# Patient Record
Sex: Female | Born: 1950
Health system: Southern US, Community
[De-identification: ages and names within clinical notes are randomized; demographics above are authoritative.]

## PROBLEM LIST (undated history)

## (undated) DIAGNOSIS — K219 Gastro-esophageal reflux disease without esophagitis: Secondary | ICD-10-CM

## (undated) DIAGNOSIS — R3129 Other microscopic hematuria: Secondary | ICD-10-CM

## (undated) HISTORY — PX: LAPAROSCOPY: SHX197

## (undated) HISTORY — DX: Other microscopic hematuria: R31.29

## (undated) HISTORY — PX: LAPAROSCOPY FOR ECTOPIC PREGNANCY: SUR765

## (undated) HISTORY — DX: Gastro-esophageal reflux disease without esophagitis: K21.9

---

## 1997-09-02 ENCOUNTER — Other Ambulatory Visit: Admission: RE | Admit: 1997-09-02 | Discharge: 1997-09-02 | Payer: Self-pay | Admitting: Obstetrics and Gynecology

## 1999-02-06 ENCOUNTER — Other Ambulatory Visit: Admission: RE | Admit: 1999-02-06 | Discharge: 1999-02-06 | Payer: Self-pay | Admitting: Obstetrics and Gynecology

## 2000-01-25 ENCOUNTER — Ambulatory Visit (HOSPITAL_COMMUNITY): Admission: RE | Admit: 2000-01-25 | Discharge: 2000-01-25 | Payer: Self-pay | Admitting: Internal Medicine

## 2000-01-25 ENCOUNTER — Encounter: Payer: Self-pay | Admitting: Internal Medicine

## 2000-03-07 ENCOUNTER — Other Ambulatory Visit: Admission: RE | Admit: 2000-03-07 | Discharge: 2000-03-07 | Payer: Self-pay | Admitting: Obstetrics and Gynecology

## 2001-02-15 ENCOUNTER — Other Ambulatory Visit: Admission: RE | Admit: 2001-02-15 | Discharge: 2001-02-15 | Payer: Self-pay | Admitting: Obstetrics and Gynecology

## 2002-01-10 ENCOUNTER — Other Ambulatory Visit: Admission: RE | Admit: 2002-01-10 | Discharge: 2002-01-10 | Payer: Self-pay | Admitting: Obstetrics and Gynecology

## 2003-02-06 ENCOUNTER — Other Ambulatory Visit: Admission: RE | Admit: 2003-02-06 | Discharge: 2003-02-06 | Payer: Self-pay | Admitting: Obstetrics and Gynecology

## 2004-02-26 LAB — HM COLONOSCOPY

## 2004-03-11 ENCOUNTER — Other Ambulatory Visit: Admission: RE | Admit: 2004-03-11 | Discharge: 2004-03-11 | Payer: Self-pay | Admitting: Obstetrics and Gynecology

## 2004-04-01 ENCOUNTER — Ambulatory Visit: Payer: Self-pay | Admitting: Internal Medicine

## 2004-05-08 ENCOUNTER — Ambulatory Visit: Payer: Self-pay | Admitting: Internal Medicine

## 2005-01-08 ENCOUNTER — Ambulatory Visit: Payer: Self-pay | Admitting: Internal Medicine

## 2005-01-19 ENCOUNTER — Ambulatory Visit: Payer: Self-pay | Admitting: Internal Medicine

## 2005-03-24 ENCOUNTER — Other Ambulatory Visit: Admission: RE | Admit: 2005-03-24 | Discharge: 2005-03-24 | Payer: Self-pay | Admitting: Obstetrics and Gynecology

## 2006-03-04 ENCOUNTER — Ambulatory Visit: Payer: Self-pay | Admitting: Family Medicine

## 2006-03-17 ENCOUNTER — Ambulatory Visit: Payer: Self-pay | Admitting: Internal Medicine

## 2006-04-11 ENCOUNTER — Ambulatory Visit: Payer: Self-pay | Admitting: Internal Medicine

## 2006-04-11 LAB — CONVERTED CEMR LAB
ALT: 29 units/L (ref 0–40)
AST: 32 units/L (ref 0–37)
Albumin: 4 g/dL (ref 3.5–5.2)
Alkaline Phosphatase: 45 units/L (ref 39–117)
BUN: 11 mg/dL (ref 6–23)
Basophils Absolute: 0 10*3/uL (ref 0.0–0.1)
Basophils Relative: 0.3 % (ref 0.0–1.0)
CO2: 30 meq/L (ref 19–32)
Calcium: 9.3 mg/dL (ref 8.4–10.5)
Chloride: 110 meq/L (ref 96–112)
Chol/HDL Ratio, serum: 4.2
Cholesterol: 196 mg/dL (ref 0–200)
Creatinine, Ser: 0.9 mg/dL (ref 0.4–1.2)
Eosinophil percent: 5.7 % — ABNORMAL HIGH (ref 0.0–5.0)
GFR calc non Af Amer: 70 mL/min
Glomerular Filtration Rate, Af Am: 84 mL/min/{1.73_m2}
Glucose, Bld: 91 mg/dL (ref 70–99)
HCT: 43.5 % (ref 36.0–46.0)
HDL: 46.3 mg/dL (ref >39.0)
Hemoglobin: 14.6 g/dL (ref 12.0–15.0)
LDL Cholesterol: 130 mg/dL — ABNORMAL HIGH (ref 0–99)
Lymphocytes Relative: 36.2 % (ref 12.0–46.0)
MCHC: 33.6 g/dL (ref 30.0–36.0)
MCV: 91 fL (ref 78.0–100.0)
Monocytes Absolute: 0.4 10*3/uL (ref 0.2–0.7)
Monocytes Relative: 7.6 % (ref 3.0–11.0)
Neutro Abs: 2.6 10*3/uL (ref 1.4–7.7)
Neutrophils Relative %: 50.2 % (ref 43.0–77.0)
Platelets: 351 10*3/uL (ref 150–400)
Potassium: 4.4 meq/L (ref 3.5–5.1)
RBC: 4.78 M/uL (ref 3.87–5.11)
RDW: 11.4 % — ABNORMAL LOW (ref 11.5–14.6)
Sodium: 145 meq/L (ref 135–145)
TSH: 1.58 microintl units/mL (ref 0.35–5.50)
Total Bilirubin: 0.7 mg/dL (ref 0.3–1.2)
Total Protein: 7.2 g/dL (ref 6.0–8.3)
Triglyceride fasting, serum: 99 mg/dL (ref 0–149)
VLDL: 20 mg/dL (ref 0–40)
WBC: 5.1 10*3/uL (ref 4.5–10.5)

## 2006-04-18 ENCOUNTER — Ambulatory Visit: Payer: Self-pay | Admitting: Internal Medicine

## 2007-01-04 ENCOUNTER — Encounter: Payer: Self-pay | Admitting: Internal Medicine

## 2007-01-26 DIAGNOSIS — K219 Gastro-esophageal reflux disease without esophagitis: Secondary | ICD-10-CM | POA: Insufficient documentation

## 2007-01-26 DIAGNOSIS — J309 Allergic rhinitis, unspecified: Secondary | ICD-10-CM | POA: Insufficient documentation

## 2008-01-02 ENCOUNTER — Encounter: Payer: Self-pay | Admitting: Internal Medicine

## 2008-06-17 LAB — HM MAMMOGRAPHY: HM Mammogram: NORMAL

## 2008-10-23 ENCOUNTER — Ambulatory Visit: Payer: Self-pay | Admitting: Internal Medicine

## 2008-10-23 LAB — CONVERTED CEMR LAB
ALT: 17 units/L (ref 0–35)
AST: 23 units/L (ref 0–37)
Albumin: 4.1 g/dL (ref 3.5–5.2)
Alkaline Phosphatase: 49 units/L (ref 39–117)
BUN: 17 mg/dL (ref 6–23)
Basophils Absolute: 0 10*3/uL (ref 0.0–0.1)
Basophils Relative: 0.1 % (ref 0.0–3.0)
Bilirubin Urine: NEGATIVE
Bilirubin, Direct: 0 mg/dL (ref 0.0–0.3)
CO2: 30 meq/L (ref 19–32)
Calcium: 9.5 mg/dL (ref 8.4–10.5)
Chloride: 112 meq/L (ref 96–112)
Cholesterol: 201 mg/dL — ABNORMAL HIGH (ref 0–200)
Creatinine, Ser: 0.9 mg/dL (ref 0.4–1.2)
Direct LDL: 134.5 mg/dL
Eosinophils Absolute: 0.2 10*3/uL (ref 0.0–0.7)
Eosinophils Relative: 4.7 % (ref 0.0–5.0)
GFR calc non Af Amer: 68.85 mL/min (ref >60)
Glucose, Bld: 89 mg/dL (ref 70–99)
Glucose, Urine, Semiquant: NEGATIVE
HCT: 40.1 % (ref 36.0–46.0)
HDL: 53.8 mg/dL (ref >39.00)
Hemoglobin: 14.1 g/dL (ref 12.0–15.0)
Ketones, urine, test strip: NEGATIVE
Lymphocytes Relative: 40.4 % (ref 12.0–46.0)
Lymphs Abs: 1.7 10*3/uL (ref 0.7–4.0)
MCHC: 35.1 g/dL (ref 30.0–36.0)
MCV: 87.6 fL (ref 78.0–100.0)
Monocytes Absolute: 0.3 10*3/uL (ref 0.1–1.0)
Monocytes Relative: 7.5 % (ref 3.0–12.0)
Neutro Abs: 1.9 10*3/uL (ref 1.4–7.7)
Neutrophils Relative %: 47.3 % (ref 43.0–77.0)
Nitrite: NEGATIVE
Platelets: 286 10*3/uL (ref 150.0–400.0)
Potassium: 3.7 meq/L (ref 3.5–5.1)
Protein, U semiquant: NEGATIVE
RBC: 4.58 M/uL (ref 3.87–5.11)
RDW: 12.8 % (ref 11.5–14.6)
Sodium: 145 meq/L (ref 135–145)
Specific Gravity, Urine: 1.025
TSH: 1.06 microintl units/mL (ref 0.35–5.50)
Total Bilirubin: 0.7 mg/dL (ref 0.3–1.2)
Total CHOL/HDL Ratio: 4
Total Protein: 7.6 g/dL (ref 6.0–8.3)
Triglycerides: 108 mg/dL (ref 0.0–149.0)
Urobilinogen, UA: 0.2
VLDL: 21.6 mg/dL (ref 0.0–40.0)
WBC Urine, dipstick: NEGATIVE
WBC: 4.1 10*3/uL — ABNORMAL LOW (ref 4.5–10.5)
pH: 5.5

## 2008-11-11 ENCOUNTER — Ambulatory Visit: Payer: Self-pay | Admitting: Internal Medicine

## 2008-11-11 DIAGNOSIS — Z9189 Other specified personal risk factors, not elsewhere classified: Secondary | ICD-10-CM | POA: Insufficient documentation

## 2009-02-25 ENCOUNTER — Encounter (INDEPENDENT_AMBULATORY_CARE_PROVIDER_SITE_OTHER): Payer: Self-pay | Admitting: *Deleted

## 2009-08-20 ENCOUNTER — Ambulatory Visit: Payer: Self-pay | Admitting: Internal Medicine

## 2009-08-20 DIAGNOSIS — J019 Acute sinusitis, unspecified: Secondary | ICD-10-CM | POA: Insufficient documentation

## 2009-09-17 ENCOUNTER — Telehealth: Payer: Self-pay | Admitting: Gastroenterology

## 2009-12-17 ENCOUNTER — Encounter: Payer: Self-pay | Admitting: Internal Medicine

## 2010-06-14 LAB — CONVERTED CEMR LAB: Pap Smear: NORMAL

## 2010-06-16 NOTE — Progress Notes (Signed)
Summary: Schedule Colonoscopy  Phone Note Outgoing Call Call back at Home Phone [phone]   Call placed by: Harlow Mares CMA Duncan Dull),  Sep 17, 2009 11:35 AM Call placed to: Patient Summary of Call: number busy, patient due for colonoscopy. Initial call taken by: Harlow Mares CMA Duncan Dull),  Sep 17, 2009 11:36 AM  Follow-up for Phone Call        patient states that she is checking with her insurance about coverage for her colonoscopy she does have family history of colon polyps but she says that if we code her colonoscopy as a screening then her colonoscopy is covered at 100% but if its coded as family history it is only covered at 80%. I advised the patient that we would have to code it as family history bc if we coded her procedure as screening then it would be insurance fraud. she states that she would have to check somemore and call me back to schedule if she decided to have the procedure.  Follow-up by: Harlow Mares CMA Duncan Dull),  Sep 29, 2009 3:37 PM

## 2010-06-16 NOTE — Letter (Signed)
Summary: [facility]  [facility]   Imported By: Maryln Gottron 01/07/2010 14:21:05  _____________________________________________________________________  External Attachment:    Type:   Image     Comment:   External Document

## 2010-06-16 NOTE — Assessment & Plan Note (Signed)
Summary: URI//CCM   Vital Signs:  Patient profile:   60 year old female Menstrual status:  postmenopausal Height:      62 inches Weight:      157 pounds BMI:     28.82 O2 Sat:      85 % on Room air Temp:     98.5 degrees F oral BP sitting:   110 / 80  (left arm) Cuff size:   regular  Vitals Entered By: Romualdo Bolk, CMA (AAMA) (August 20, 2009 11:32 AM)  O2 Flow:  Room air CC: Coughing, congestion, sinus pressure, sore throat from drainage, no fever that started 3/29   History of Present Illness: Kimberly Schroeder comesin for poss sinusitis  . She has had  coughing and has for 8 days and not getting better no fever.     froatal area and sided and face pain    . Right sided  throat soreness  and cough .   No relief with reg meds  . pattern is persistent and progressive    no cp sob or wheezing   allergy shots.    not using nasonsex causes nose irritation    Preventive Screening-Counseling & Management  Alcohol-Tobacco     Alcohol drinks/day: <1     Smoking Status: never  Caffeine-Diet-Exercise     Caffeine use/day: 1 cup a day     Does Patient Exercise: yes     Exercise (avg: min/session): >60  Current Medications (verified): 1)  Zyrtec Allergy 10 Mg  Tabs (Cetirizine Hcl) 2)  Nasonex 50 Mcg/act  Susp (Mometasone Furoate)  Allergies (verified): 1)  ! * Ivp Dye  Past History:  Past medical, surgical, family and social histories (including risk factors) reviewed, and no changes noted (except as noted below).  Past Medical History: Reviewed history from 11/11/2008 and no changes required. SAR Allergic rhinitis GERD G2   Past Surgical History: Reviewed history from 01/26/2007 and no changes required. Colonoscopy Laparoscopy 2 Ectopic Pregnancies  Past History:  Care Management: Gynecology: Adalberto Ill Allergy: Gilpin Callas    Urology in the past  GI Jarold Motto.  Family History: Reviewed history from 01/26/2007 and no changes required. Family History of  Parkinsons Family History of Arthritis Family History of Colon CA 1st degree relative <60 Family History Hypertension  Social History: Reviewed history from 11/11/2008 and no changes required. Occupation: Married Never Smoked Alcohol use-yes [employer] 2  and rescue dog.   Review of Systems  The patient denies anorexia, fever, weight loss, weight gain, vision loss, decreased hearing, chest pain, syncope, dyspnea on exertion, peripheral edema, prolonged cough, abdominal pain, transient blindness, difficulty walking, abnormal bleeding, enlarged lymph nodes, and angioedema.    Physical Exam  General:  alert, well-developed, and well-nourished.  tired appearing  congested   looks mildy ill  Head:  normocephalic and atraumatic.   Eyes:  vision grossly intact and pupils equal.    PERRL, EOMs full, conjunctiva clear  Ears:  R ear normal, L ear normal, and no external deformities.   Nose:  no external deformity and no external erythema.  congested  tender left and right frontal  Mouth:  mild erythema no pnd seen  no lesions  Neck:  tender right ac but no adenopathy Lungs:  Normal respiratory effort, chest expands symmetrically. Lungs are clear to auscultation, no crackles or wheezes.no dullness.   Heart:  Normal rate and regular rhythm. S1 and S2 normal without gallop, murmur, click, rub or other extra sounds.no lifts.  Pulses:  nl cap refill  Extremities:  no clubbing cyanosis or edema  Neurologic:  non focal alert & oriented X3 and gait normal.   Skin:  turgor normal and color normal.   Cervical Nodes:  no anterior cervical adenopathy and no posterior cervical adenopathy.   Psych:  Oriented X3, normally interactive, and good eye contact.     Impression & Recommendations:  Problem # 1:  SINUSITIS - ACUTE-NOS (ICD-461.9)  progressing uri with localized symptom  options discussed . will rx for sinusitis  Her updated medication list for this problem includes:    Nasonex 50 Mcg/act Susp  (Mometasone furoate) ..... Not taking cause of nose irritation    Amoxicillin-pot Clavulanate 875-125 Mg Tabs (Amoxicillin-pot clavulanate) .Marland Kitchen... 1 by mouth two times a day for 10 days for sinusitis  Orders: Prescription Created Electronically [id number])  Problem # 2:  ALLERGIC RHINITIS (ICD-477.9) could contribute to infection    consider trying other incs and disc with allergist  for other options. Her updated medication list for this problem includes:    Zyrtec Allergy 10 Mg Tabs (Cetirizine hcl)    Nasonex 50 Mcg/act Susp (Mometasone furoate) ..... Not taking cause of nose irritation  Complete Medication List: 1)  Zyrtec Allergy 10 Mg Tabs (Cetirizine hcl) 2)  Nasonex 50 Mcg/act Susp (Mometasone furoate) .... Not taking cause of nose irritation 3)  Amoxicillin-pot Clavulanate 875-125 Mg Tabs (Amoxicillin-pot clavulanate) .Marland Kitchen.. 1 by mouth two times a day for 10 days for sinusitis 4)  Allergy Shots   Patient Instructions: 1)  Acute sinusitis symptoms for less than 10 days areusually  not helped by antibiotics. Use warm moist compresses, and over the counter decongestants( only as directed). Call if no improvement in 5-7 days, sooner if increasing pain, fever, or new symptoms.  2)  begin antibioitc   .  expect improvement in 3-5 days . 3)  cough may last longer . 4)  call if fever or prn Prescriptions: AMOXICILLIN-POT CLAVULANATE 875-125 MG TABS (AMOXICILLIN-POT CLAVULANATE) 1 by mouth two times a day for 10 days for sinusitis  #20 x 0   Entered and Authorized by:   Madelin Headings MD   Signed by:   Madelin Headings MD on 08/20/2009   Method used:   Electronically to        [facility]  [phone]* (retail)       [address] [place], [place]  [address]       Ph: [phone] or [phone]       Fax: [phone]   RxID:   [id number]

## 2010-11-24 ENCOUNTER — Ambulatory Visit (INDEPENDENT_AMBULATORY_CARE_PROVIDER_SITE_OTHER): Payer: Managed Care, Other (non HMO) | Admitting: Internal Medicine

## 2010-11-24 ENCOUNTER — Encounter: Payer: Self-pay | Admitting: Internal Medicine

## 2010-11-24 VITALS — BP 120/80 | HR 66 | Wt 159.0 lb

## 2010-11-24 DIAGNOSIS — N390 Urinary tract infection, site not specified: Secondary | ICD-10-CM

## 2010-11-24 DIAGNOSIS — R300 Dysuria: Secondary | ICD-10-CM | POA: Insufficient documentation

## 2010-11-24 DIAGNOSIS — J309 Allergic rhinitis, unspecified: Secondary | ICD-10-CM | POA: Insufficient documentation

## 2010-11-24 LAB — POCT URINALYSIS DIPSTICK
Bilirubin, UA: NEGATIVE
Ketones, UA: NEGATIVE
pH, UA: 5.5

## 2010-11-24 MED ORDER — SULFAMETHOXAZOLE-TRIMETHOPRIM 800-160 MG PO TABS: 1.0000 | ORAL_TABLET | Freq: Two times a day (BID) | ORAL | Status: AC

## 2010-11-24 NOTE — Patient Instructions (Signed)
Take antibiotic  Will notify you  of labs culture when available. Expect to be better in 48 hours or so. Call if needed.

## 2010-11-24 NOTE — Progress Notes (Signed)
  Subjective:    Patient ID: Kimberly Schroeder, female    DOB: [birth year], 60 y.o.   MRN: [id number]  HPI Patient comes in today for acute visit. She had the onset of dysuria and urgency less than 24 hours ago. There is no fever chills or significant abdominal pain or vaginal symptoms. She took Advil for this. Increased cranberry juice.  Her last urinary tract infection was a number of years ago and she has not been on antibiotics recently.   Review of Systems Negative for chest pain shortness of breath nausea vomiting unusual rashes. No allergies to antibiotics.  Past history family history social history reviewed in the electronic medical record.     Objective:   Physical Exam Well-developed well-nourished in no acute distress Chest:  Clear to A&P without wheezes rales or rhonchi CV:  S1-S2 no gallops or murmurs peripheral perfusion is normal Abdomen:  Sof,t normal bowel sounds without hepatosplenomegaly, no guarding rebound or masses no CVA tenderness Minimal bladder tenderness    [place] 2+bl 1+ WBC Assessment & Plan:  Dysuria, probable acute uncomplicated UTI   Expectant management. Counseled. Recheck if needed Call with culture

## 2011-02-17 ENCOUNTER — Ambulatory Visit (INDEPENDENT_AMBULATORY_CARE_PROVIDER_SITE_OTHER): Payer: Managed Care, Other (non HMO)

## 2011-02-17 DIAGNOSIS — Z23 Encounter for immunization: Secondary | ICD-10-CM

## 2011-03-16 ENCOUNTER — Encounter: Payer: Self-pay | Admitting: Gastroenterology

## 2011-03-31 ENCOUNTER — Ambulatory Visit (AMBULATORY_SURGERY_CENTER): Payer: Managed Care, Other (non HMO) | Admitting: *Deleted

## 2011-03-31 VITALS — Ht 62.0 in | Wt 157.0 lb

## 2011-03-31 DIAGNOSIS — Z1211 Encounter for screening for malignant neoplasm of colon: Secondary | ICD-10-CM

## 2011-03-31 MED ORDER — MOVIPREP 100 G PO SOLR: ORAL | Status: DC

## 2011-04-06 ENCOUNTER — Encounter: Payer: Self-pay | Admitting: Internal Medicine

## 2011-04-06 ENCOUNTER — Ambulatory Visit (INDEPENDENT_AMBULATORY_CARE_PROVIDER_SITE_OTHER): Payer: Managed Care, Other (non HMO) | Admitting: Internal Medicine

## 2011-04-06 VITALS — BP 120/80 | HR 66 | Temp 97.9°F | Wt 158.0 lb

## 2011-04-06 DIAGNOSIS — N390 Urinary tract infection, site not specified: Secondary | ICD-10-CM

## 2011-04-06 DIAGNOSIS — R300 Dysuria: Secondary | ICD-10-CM

## 2011-04-06 LAB — POCT URINALYSIS DIPSTICK
Bilirubin, UA: NEGATIVE
Glucose, UA: NEGATIVE
Ketones, UA: NEGATIVE
Leukocytes, UA: NEGATIVE
Nitrite, UA: NEGATIVE
Protein, UA: NEGATIVE
Spec Grav, UA: 1.015
Urobilinogen, UA: 0.2
pH, UA: 5.5

## 2011-04-06 MED ORDER — SULFAMETHOXAZOLE-TRIMETHOPRIM 800-160 MG PO TABS: 1.0000 | ORAL_TABLET | Freq: Two times a day (BID) | ORAL | Status: AC

## 2011-04-06 NOTE — Progress Notes (Signed)
  Subjective:    Patient ID: Kimberly Schroeder, female    DOB: 10-14-1950, 60 y.o.   MRN: [id number]  HPI  Onset 3 days ago  Urgency and burning  No blood.   No meds taken except advil No fever  Or upper aback pain.  Last uti  In past  July better with amtibiotic  Review of Systems No fever nvd rash Past history family history social history reviewed in the electronic medical record. No hx of renal stone     Objective:   Physical Exam WDWN in nad Abdomen:  Sof,t normal bowel sounds without hepatosplenomegaly, no guarding rebound or masses no CVA tenderness Skin: normal capillary refill ,turgor , color: No acute rashes ,petechiae or bruising See UA  2+ blood      Assessment & Plan:  prob uncomplicated UTI hx of same   Counseled. Empiric rx and cx done  Fu if  persistent or progressive     Is due for yearly labs and check up would like labs before end of year  Will get labs and fu  screening as appropriate

## 2011-04-06 NOTE — Patient Instructions (Signed)
Take antibiotic   Will call with culture results . Call if not getting better .  Or as needed,

## 2011-04-08 LAB — URINE CULTURE: Organism ID, Bacteria: NO GROWTH

## 2011-04-15 NOTE — Progress Notes (Signed)
Quick Note:  Pt aware of results. ______ 

## 2011-04-16 ENCOUNTER — Other Ambulatory Visit (INDEPENDENT_AMBULATORY_CARE_PROVIDER_SITE_OTHER): Payer: Managed Care, Other (non HMO)

## 2011-04-16 DIAGNOSIS — Z0000 Encounter for general adult medical examination without abnormal findings: Secondary | ICD-10-CM

## 2011-04-16 LAB — CBC WITH DIFFERENTIAL/PLATELET
Basophils Absolute: 0 10*3/uL (ref 0.0–0.1)
Eosinophils Absolute: 0.1 10*3/uL (ref 0.0–0.7)
HCT: 43.7 % (ref 36.0–46.0)
Hemoglobin: 14.9 g/dL (ref 12.0–15.0)
Lymphs Abs: 1.8 10*3/uL (ref 0.7–4.0)
MCHC: 34.2 g/dL (ref 30.0–36.0)
Neutro Abs: 2.4 10*3/uL (ref 1.4–7.7)
RDW: 12.9 % (ref 11.5–14.6)

## 2011-04-16 LAB — TSH: TSH: 1.27 u[IU]/mL (ref 0.35–5.50)

## 2011-04-16 LAB — LIPID PANEL
Cholesterol: 201 mg/dL — ABNORMAL HIGH (ref 0–200)
HDL: 61.4 mg/dL (ref >39.00)
Triglycerides: 85 mg/dL (ref 0.0–149.0)

## 2011-04-16 LAB — POCT URINALYSIS DIPSTICK
Ketones, UA: NEGATIVE
Protein, UA: NEGATIVE
Spec Grav, UA: 1.025
pH, UA: 5

## 2011-04-16 LAB — LDL CHOLESTEROL, DIRECT: Direct LDL: 132.6 mg/dL

## 2011-04-16 LAB — BASIC METABOLIC PANEL
BUN: 17 mg/dL (ref 6–23)
CO2: 27 mEq/L (ref 19–32)
Calcium: 9.2 mg/dL (ref 8.4–10.5)
Glucose, Bld: 93 mg/dL (ref 70–99)
Sodium: 140 mEq/L (ref 135–145)

## 2011-04-16 LAB — HEPATIC FUNCTION PANEL: Albumin: 4 g/dL (ref 3.5–5.2)

## 2011-04-21 ENCOUNTER — Ambulatory Visit (AMBULATORY_SURGERY_CENTER): Payer: Managed Care, Other (non HMO) | Admitting: Gastroenterology

## 2011-04-21 ENCOUNTER — Encounter: Payer: Self-pay | Admitting: Gastroenterology

## 2011-04-21 VITALS — BP 120/80 | HR 85 | Temp 97.4°F | Resp 20 | Ht 62.0 in | Wt 157.0 lb

## 2011-04-21 DIAGNOSIS — K5730 Diverticulosis of large intestine without perforation or abscess without bleeding: Secondary | ICD-10-CM

## 2011-04-21 DIAGNOSIS — Z1211 Encounter for screening for malignant neoplasm of colon: Secondary | ICD-10-CM

## 2011-04-21 MED ORDER — SODIUM CHLORIDE 0.9 % IV SOLN: 500.0000 mL | INTRAVENOUS | Status: DC

## 2011-04-21 NOTE — Patient Instructions (Signed)
FOLLOW INSTRUCTIONS ON BLUE AND GREEN INSTRUCTION SHEETS.  CONTINUE YOUR MEDICATIONS.  HIGH FIBER DIET WITH LIBERAL FLUID INTAKE.  NEXT COLONOSCOPY IN 5 YEARS, 2017.

## 2011-04-21 NOTE — Progress Notes (Signed)
Patient did not experience any of the following events: a burn prior to discharge; a fall within the facility; wrong site/side/patient/procedure/implant event; or a hospital transfer or hospital admission upon discharge from the facility. (G8907) Patient did not have preoperative order for IV antibiotic SSI prophylaxis. (G8918)  

## 2011-04-22 ENCOUNTER — Ambulatory Visit (INDEPENDENT_AMBULATORY_CARE_PROVIDER_SITE_OTHER): Payer: Managed Care, Other (non HMO) | Admitting: Internal Medicine

## 2011-04-22 ENCOUNTER — Telehealth: Payer: Self-pay

## 2011-04-22 ENCOUNTER — Encounter: Payer: Self-pay | Admitting: Internal Medicine

## 2011-04-22 DIAGNOSIS — E785 Hyperlipidemia, unspecified: Secondary | ICD-10-CM

## 2011-04-22 DIAGNOSIS — J309 Allergic rhinitis, unspecified: Secondary | ICD-10-CM

## 2011-04-22 DIAGNOSIS — R319 Hematuria, unspecified: Secondary | ICD-10-CM

## 2011-04-22 NOTE — Assessment & Plan Note (Signed)
No change 

## 2011-04-22 NOTE — Patient Instructions (Signed)
consider omnaris or veramyst for nose allergies.  Can do preventive  Check up in a year or as needed.

## 2011-04-22 NOTE — Progress Notes (Signed)
  Subjective:    Patient ID: Kimberly Schroeder, female    DOB: 1951-01-13, 60 y.o.   MRN: [id number]  HPI Patient comes in today for follow up of  multiple medical problems.   UTI sx resolve with antibiotic has had hematuria eval in past and neg Allergies :  Continued.  Worse  In fall spring and dust  Nose issues with oncs but hasnt tried many no cough asthma currently Has had elevated lipids butnot severe hasd screening labs done  And here for review  Review of Systems gerd resolved  Neg cp sob bleeding otherwise new problem sees gyne   utd on parameters just had colonoscopy  Past history family history social history reviewed in the electronic medical record.     Objective:   Physical Exam WDWN in nad  Congested  HEENT otherwise normal  Neck: Supple without adenopathy or masses or bruits Chest:  Clear to A&P without wheezes rales or rhonchi CV:  S1-S2 no gallops or murmurs peripheral perfusion is normal   Lab Results  Component Value Date   WBC 4.7 04/16/2011   HGB 14.9 04/16/2011   HCT 43.7 04/16/2011   PLT 285.0 04/16/2011   GLUCOSE 93 04/16/2011   CHOL 201* 04/16/2011   TRIG 85.0 04/16/2011   HDL 61.40 04/16/2011   LDLDIRECT 132.6 04/16/2011   LDLCALC 130* 04/11/2006   ALT 22 04/16/2011   AST 26 04/16/2011   NA 140 04/16/2011   K 4.2 04/16/2011   CL 108 04/16/2011   CREATININE 0.9 04/16/2011   BUN 17 04/16/2011   CO2 27 04/16/2011   TSH 1.27 04/16/2011   UA 3 + blood    Assessment & Plan:  Hematuria  Chronic  Prev eval  uti better  Sx but ng on cx  Lipids good ratio disc lsi follow Allergic   Not using nose spray cause of irritation consider others nin the future. Marland Kitchen  GERD better  No meds

## 2011-04-22 NOTE — Telephone Encounter (Signed)

## 2011-04-23 ENCOUNTER — Encounter: Payer: Self-pay | Admitting: Internal Medicine

## 2011-04-23 DIAGNOSIS — R319 Hematuria, unspecified: Secondary | ICD-10-CM | POA: Insufficient documentation

## 2011-04-23 DIAGNOSIS — E785 Hyperlipidemia, unspecified: Secondary | ICD-10-CM | POA: Insufficient documentation

## 2011-07-12 ENCOUNTER — Encounter: Payer: Self-pay | Admitting: Internal Medicine

## 2011-07-12 ENCOUNTER — Ambulatory Visit (INDEPENDENT_AMBULATORY_CARE_PROVIDER_SITE_OTHER): Payer: Managed Care, Other (non HMO) | Admitting: Internal Medicine

## 2011-07-12 DIAGNOSIS — J309 Allergic rhinitis, unspecified: Secondary | ICD-10-CM

## 2011-07-12 DIAGNOSIS — J019 Acute sinusitis, unspecified: Secondary | ICD-10-CM

## 2011-07-12 MED ORDER — AMOXICILLIN-POT CLAVULANATE 875-125 MG PO TABS: 1.0000 | ORAL_TABLET | Freq: Two times a day (BID) | ORAL | Status: AC

## 2011-07-12 NOTE — Progress Notes (Signed)
  Subjective:    Patient ID: Kimberly Schroeder, female    DOB: 1950-05-30, 61 y.o.   MRN: [id number]  HPI Patient comes in today for SDA  For acute problem evaluation. Has and sore throats  Last week and now" head ready to explode"  Face and teeth hurts  And ear hurts   bad pain yesterday. No fever except at beginning off and on.  Has cough but no really in chest .  Nasal drainage  But not that stuffy.  Self rx  mucinex d and advil and saline rinses.   Still has face pain and pressure worse with bending over or moving.  Over the last days .  Illness about 8 days  at this point . Last antibiotic use was in the fall for a UTI.  Review of Systems NO NVD dec appetite no sob .  Syncope.   Past history family history social history reviewed in the electronic medical record. Outpatient Prescriptions Prior to Visit  Medication Sig Dispense Refill  . Calcium-Vitamin D-Vitamin K (VIACTIV PO) Take by mouth.        . cetirizine (ZYRTEC) 10 MG tablet Take 10 mg by mouth daily.        . Estradiol (VAGIFEM) 10 MCG TABS Place vaginally.        . fish oil-omega-3 fatty acids 1000 MG capsule Take 2 g by mouth daily.        . Multiple Vitamins-Minerals (MULTIVITAMIN WITH MINERALS) tablet Take 1 tablet by mouth daily.        . NON FORMULARY Allergy shots       . psyllium (METAMUCIL) 58.6 % packet Take 1 packet by mouth daily.             Objective:   Physical Exam WDWN in NAD  quiet respirations; mildly congested  somewhat hoarse. Non toxic . HEENT: Normocephalic ;atraumatic , Eyes;  PERRL, EOMs  Full, lids and conjunctiva clear,,Ears: no deformities, canals nl, TM landmarks normal, Nose: no deformity or discharge but congested;face very tender over both maxilla area  No swelling Mouth : OP clear without lesion or edema . Mod redness  Neck: Supple without adenopathy or masses or bruits Chest:  Clear to A&P without wheezes rales or rhonchi CV:  S1-S2 no gallops or murmurs peripheral perfusion is  normal Skin :nl perfusion and no acute rashes  Abdomen:  Sof,t normal bowel sounds without hepatosplenomegaly, no guarding rebound or masses no CVA tenderness      Assessment & Plan:  Bilateral maxillary sinusitis unresponsive to decongestants saline washes over the last couple days.  Underlying allergic rhinitis history.  Continue sinus hygiene measures and add antibiotic as discussed expectant management call if not improving in the next 3-5 days or as needed

## 2011-07-12 NOTE — Patient Instructions (Signed)
Expect improvement in the next 3- 5 days  Call if not helping. Continue saline washes etc.  Sinusitis Sinuses are air pockets within the bones of your face. The growth of bacteria within a sinus leads to infection. The infection prevents the sinuses from draining. This infection is called sinusitis. SYMPTOMS  There will be different areas of pain depending on which sinuses have become infected.  The maxillary sinuses often produce pain beneath the eyes.   Frontal sinusitis may cause pain in the middle of the forehead and above the eyes.  Other problems (symptoms) include:  Toothaches.   Colored, pus-like (purulent) drainage from the nose.   Swelling, warmth, and tenderness over the sinus areas may be signs of infection.  TREATMENT  Sinusitis is most often determined by an exam.X-rays may be taken. If x-rays have been taken, make sure you obtain your results or find out how you are to obtain them. Your caregiver may give you medications (antibiotics). These are medications that will help kill the bacteria causing the infection. You may also be given a medication (decongestant) that helps to reduce sinus swelling.  HOME CARE INSTRUCTIONS   Only take over-the-counter or prescription medicines for pain, discomfort, or fever as directed by your caregiver.   Drink extra fluids. Fluids help thin the mucus so your sinuses can drain more easily.   Applying either moist heat or ice packs to the sinus areas may help relieve discomfort.   Use saline nasal sprays to help moisten your sinuses. The sprays can be found at your local drugstore.  SEEK IMMEDIATE MEDICAL CARE IF:  You have a fever.   You have increasing pain, severe headaches, or toothache.   You have nausea, vomiting, or drowsiness.   You develop unusual swelling around the face or trouble seeing.  MAKE SURE YOU:   Understand these instructions.   Will watch your condition.   Will get help right away if you are not doing  well or get worse.  Document Released: 05/03/2005 Document Revised: 01/13/2011 Document Reviewed: 11/30/2006 [facility] Patient Information 2012 [place], [place].

## 2012-03-24 ENCOUNTER — Other Ambulatory Visit (INDEPENDENT_AMBULATORY_CARE_PROVIDER_SITE_OTHER): Payer: Managed Care, Other (non HMO)

## 2012-03-24 DIAGNOSIS — Z0000 Encounter for general adult medical examination without abnormal findings: Secondary | ICD-10-CM

## 2012-03-24 LAB — POCT URINALYSIS DIPSTICK
Leukocytes, UA: NEGATIVE
Protein, UA: NEGATIVE
Spec Grav, UA: 1.02
Urobilinogen, UA: 0.2

## 2012-03-24 LAB — CBC WITH DIFFERENTIAL/PLATELET
Basophils Relative: 0.6 % (ref 0.0–3.0)
Eosinophils Absolute: 0.2 10*3/uL (ref 0.0–0.7)
Hemoglobin: 14.6 g/dL (ref 12.0–15.0)
MCHC: 32.9 g/dL (ref 30.0–36.0)
MCV: 92.8 fl (ref 78.0–100.0)
Monocytes Absolute: 0.4 10*3/uL (ref 0.1–1.0)
Neutro Abs: 2.8 10*3/uL (ref 1.4–7.7)
RBC: 4.79 Mil/uL (ref 3.87–5.11)

## 2012-03-24 LAB — HEPATIC FUNCTION PANEL
Albumin: 3.9 g/dL (ref 3.5–5.2)
Total Protein: 7.2 g/dL (ref 6.0–8.3)

## 2012-03-24 LAB — BASIC METABOLIC PANEL
CO2: 27 mEq/L (ref 19–32)
Chloride: 107 mEq/L (ref 96–112)
Creatinine, Ser: 0.8 mg/dL (ref 0.4–1.2)
Sodium: 142 mEq/L (ref 135–145)

## 2012-03-24 LAB — LIPID PANEL
Cholesterol: 191 mg/dL (ref 0–200)
LDL Cholesterol: 125 mg/dL — ABNORMAL HIGH (ref 0–99)

## 2012-03-31 ENCOUNTER — Ambulatory Visit (INDEPENDENT_AMBULATORY_CARE_PROVIDER_SITE_OTHER): Payer: Managed Care, Other (non HMO) | Admitting: Internal Medicine

## 2012-03-31 ENCOUNTER — Encounter: Payer: Self-pay | Admitting: Internal Medicine

## 2012-03-31 VITALS — BP 114/80 | HR 83 | Temp 97.6°F | Ht 61.75 in | Wt 159.0 lb

## 2012-03-31 DIAGNOSIS — J309 Allergic rhinitis, unspecified: Secondary | ICD-10-CM

## 2012-03-31 DIAGNOSIS — Z0000 Encounter for general adult medical examination without abnormal findings: Secondary | ICD-10-CM

## 2012-03-31 NOTE — Patient Instructions (Addendum)
Continue lifestyle intervention healthy eating and exercise .  Please have copy of dexa  mammogram to be sent to  [place] for our records when done.  Preventive Care for Adults, Female A healthy lifestyle and preventive care can promote health and wellness. Preventive health guidelines for women include the following key practices.  A routine yearly physical is a good way to check with your caregiver about your health and preventive screening. It is a chance to share any concerns and updates on your health, and to receive a thorough exam.  Visit your dentist for a routine exam and preventive care every 6 months. Brush your teeth twice a day and floss once a day. Good oral hygiene prevents tooth decay and gum disease.  The frequency of eye exams is based on your age, health, family medical history, use of contact lenses, and other factors. Follow your caregiver's recommendations for frequency of eye exams.  Eat a healthy diet. Foods like vegetables, fruits, whole grains, low-fat dairy products, and lean protein foods contain the nutrients you need without too many calories. Decrease your intake of foods high in solid fats, added sugars, and salt. Eat the right amount of calories for you.Get information about a proper diet from your caregiver, if necessary.  Regular physical exercise is one of the most important things you can do for your health. Most adults should get at least 150 minutes of moderate-intensity exercise (any activity that increases your heart rate and causes you to sweat) each week. In addition, most adults need muscle-strengthening exercises on 2 or more days a week.  Maintain a healthy weight. The body mass index (BMI) is a screening tool to identify possible weight problems. It provides an estimate of body fat based on height and weight. Your caregiver can help determine your BMI, and can help you achieve or maintain a healthy weight.For adults 20 years and older:  A BMI below 18.5  is considered underweight.  A BMI of 18.5 to 24.9 is normal.  A BMI of 25 to 29.9 is considered overweight.  A BMI of 30 and above is considered obese.  Maintain normal blood lipids and cholesterol levels by exercising and minimizing your intake of saturated fat. Eat a balanced diet with plenty of fruit and vegetables. Blood tests for lipids and cholesterol should begin at age 32 and be repeated every 5 years. If your lipid or cholesterol levels are high, you are over 50, or you are at high risk for heart disease, you may need your cholesterol levels checked more frequently.Ongoing high lipid and cholesterol levels should be treated with medicines if diet and exercise are not effective.  If you smoke, find out from your caregiver how to quit. If you do not use tobacco, do not start.  If you are pregnant, do not drink alcohol. If you are breastfeeding, be very cautious about drinking alcohol. If you are not pregnant and choose to drink alcohol, do not exceed 1 drink per day. One drink is considered to be 12 ounces (355 mL) of beer, 5 ounces (148 mL) of wine, or 1.5 ounces (44 mL) of liquor.  Avoid use of street drugs. Do not share needles with anyone. Ask for help if you need support or instructions about stopping the use of drugs.  High blood pressure causes heart disease and increases the risk of stroke. Your blood pressure should be checked at least every 1 to 2 years. Ongoing high blood pressure should be treated with medicines if weight  loss and exercise are not effective.  If you are 30 to 59 years old, ask your caregiver if you should take aspirin to prevent strokes.  Diabetes screening involves taking a blood sample to check your fasting blood sugar level. This should be done once every 3 years, after age 24, if you are within normal weight and without risk factors for diabetes. Testing should be considered at a younger age or be carried out more frequently if you are overweight and have  at least 1 risk factor for diabetes.  Breast cancer screening is essential preventive care for women. You should practice "breast self-awareness." This means understanding the normal appearance and feel of your breasts and may include breast self-examination. Any changes detected, no matter how small, should be reported to a caregiver. Women in their 33s and 30s should have a clinical breast exam (CBE) by a caregiver as part of a regular health exam every 1 to 3 years. After age 30, women should have a CBE every year. Starting at age 60, women should consider having a mammography (breast X-ray test) every year. Women who have a family history of breast cancer should talk to their caregiver about genetic screening. Women at a high risk of breast cancer should talk to their caregivers about having magnetic resonance imaging (MRI) and a mammography every year.  The Pap test is a screening test for cervical cancer. A Pap test can show cell changes on the cervix that might become cervical cancer if left untreated. A Pap test is a procedure in which cells are obtained and examined from the lower end of the uterus (cervix).  Women should have a Pap test starting at age 70.  Between ages 88 and 42, Pap tests should be repeated every 2 years.  Beginning at age 73, you should have a Pap test every 3 years as long as the past 3 Pap tests have been normal.  Some women have medical problems that increase the chance of getting cervical cancer. Talk to your caregiver about these problems. It is especially important to talk to your caregiver if a new problem develops soon after your last Pap test. In these cases, your caregiver may recommend more frequent screening and Pap tests.  The above recommendations are the same for women who have or have not gotten the vaccine for human papillomavirus (HPV).  If you had a hysterectomy for a problem that was not cancer or a condition that could lead to cancer, then you no  longer need Pap tests. Even if you no longer need a Pap test, a regular exam is a good idea to make sure no other problems are starting.  If you are between ages 55 and 34, and you have had normal Pap tests going back 10 years, you no longer need Pap tests. Even if you no longer need a Pap test, a regular exam is a good idea to make sure no other problems are starting.  If you have had past treatment for cervical cancer or a condition that could lead to cancer, you need Pap tests and screening for cancer for at least 20 years after your treatment.  If Pap tests have been discontinued, risk factors (such as a new sexual partner) need to be reassessed to determine if screening should be resumed.  The HPV test is an additional test that may be used for cervical cancer screening. The HPV test looks for the virus that can cause the cell changes on the cervix.  The cells collected during the Pap test can be tested for HPV. The HPV test could be used to screen women aged 70 years and older, and should be used in women of any age who have unclear Pap test results. After the age of 38, women should have HPV testing at the same frequency as a Pap test.  Colorectal cancer can be detected and often prevented. Most routine colorectal cancer screening begins at the age of 57 and continues through age 38. However, your caregiver may recommend screening at an earlier age if you have risk factors for colon cancer. On a yearly basis, your caregiver may provide home test kits to check for hidden blood in the stool. Use of a small camera at the end of a tube, to directly examine the colon (sigmoidoscopy or colonoscopy), can detect the earliest forms of colorectal cancer. Talk to your caregiver about this at age 76, when routine screening begins. Direct examination of the colon should be repeated every 5 to 10 years through age 60, unless early forms of pre-cancerous polyps or small growths are found.  Hepatitis C blood  testing is recommended for all people born from [birth year] through 1965 and any individual with known risks for hepatitis C.  Practice safe sex. Use condoms and avoid high-risk sexual practices to reduce the spread of sexually transmitted infections (STIs). STIs include gonorrhea, chlamydia, syphilis, trichomonas, herpes, HPV, and human immunodeficiency virus (HIV). Herpes, HIV, and HPV are viral illnesses that have no cure. They can result in disability, cancer, and death. Sexually active women aged 80 and younger should be checked for chlamydia. Older women with new or multiple partners should also be tested for chlamydia. Testing for other STIs is recommended if you are sexually active and at increased risk.  Osteoporosis is a disease in which the bones lose minerals and strength with aging. This can result in serious bone fractures. The risk of osteoporosis can be identified using a bone density scan. Women ages 48 and over and women at risk for fractures or osteoporosis should discuss screening with their caregivers. Ask your caregiver whether you should take a calcium supplement or vitamin D to reduce the rate of osteoporosis.  Menopause can be associated with physical symptoms and risks. Hormone replacement therapy is available to decrease symptoms and risks. You should talk to your caregiver about whether hormone replacement therapy is right for you.  Use sunscreen with sun protection factor (SPF) of 30 or more. Apply sunscreen liberally and repeatedly throughout the day. You should seek shade when your shadow is shorter than you. Protect yourself by wearing long sleeves, pants, a wide-brimmed hat, and sunglasses year round, whenever you are outdoors.  Once a month, do a whole body skin exam, using a mirror to look at the skin on your back. Notify your caregiver of new moles, moles that have irregular borders, moles that are larger than a pencil eraser, or moles that have changed in shape or  color.  Stay current with required immunizations.  Influenza. You need a dose every fall (or winter). The composition of the flu vaccine changes each year, so being vaccinated once is not enough.  Pneumococcal polysaccharide. You need 1 to 2 doses if you smoke cigarettes or if you have certain chronic medical conditions. You need 1 dose at age 34 (or older) if you have never been vaccinated.  Tetanus, diphtheria, pertussis (Tdap, Td). Get 1 dose of Tdap vaccine if you are younger than age 69, are over  65 and have contact with an infant, are a Research scientist (physical sciences), are pregnant, or simply want to be protected from whooping cough. After that, you need a Td booster dose every 10 years. Consult your caregiver if you have not had at least 3 tetanus and diphtheria-containing shots sometime in your life or have a deep or dirty wound.  HPV. You need this vaccine if you are a woman age 7 or younger. The vaccine is given in 3 doses over 6 months.  Measles, mumps, rubella (MMR). You need at least 1 dose of MMR if you were born in 1957 or later. You may also need a second dose.  Meningococcal. If you are age 15 to 31 and a first-year college student living in a residence hall, or have one of several medical conditions, you need to get vaccinated against meningococcal disease. You may also need additional booster doses.  Zoster (shingles). If you are age 59 or older, you should get this vaccine.  Varicella (chickenpox). If you have never had chickenpox or you were vaccinated but received only 1 dose, talk to your caregiver to find out if you need this vaccine.  Hepatitis A. You need this vaccine if you have a specific risk factor for hepatitis A virus infection or you simply wish to be protected from this disease. The vaccine is usually given as 2 doses, 6 to 18 months apart.  Hepatitis B. You need this vaccine if you have a specific risk factor for hepatitis B virus infection or you simply wish to be  protected from this disease. The vaccine is given in 3 doses, usually over 6 months. Preventive Services / Frequency Ages 36 to 73  Blood pressure check.** / Every 1 to 2 years.  Lipid and cholesterol check.** / Every 5 years beginning at age 8.  Clinical breast exam.** / Every year after age 41.  Mammogram.** / Every year beginning at age 16 and continuing for as long as you are in good health. Consult with your caregiver.  Pap test.** / Every 3 years starting at age 75 through age 21 or 72 with a history of 3 consecutive normal Pap tests.  HPV screening.** / Every 3 years from ages 52 through ages 56 to 66 with a history of 3 consecutive normal Pap tests.  Fecal occult blood test (FOBT) of stool. / Every year beginning at age 39 and continuing until age 12. You may not need to do this test if you get a colonoscopy every 10 years.  Flexible sigmoidoscopy or colonoscopy.** / Every 5 years for a flexible sigmoidoscopy or every 10 years for a colonoscopy beginning at age 47 and continuing until age 78.  Hepatitis C blood test.** / For all people born from [birth year] through 1963 and any individual with known risks for hepatitis C.  Skin self-exam. / Monthly.  Influenza immunization.** / Every year.  Pneumococcal polysaccharide immunization.** / 1 to 2 doses if you smoke cigarettes or if you have certain chronic medical conditions.  Tetanus, diphtheria, pertussis (Tdap, Td) immunization.** / A one-time dose of Tdap vaccine. After that, you need a Td booster dose every 10 years.  Measles, mumps, rubella (MMR) immunization. / You need at least 1 dose of MMR if you were born in 1957 or later. You may also need a second dose.  Varicella immunization.** / Consult your caregiver.  Meningococcal immunization.** / Consult your caregiver.  Hepatitis A immunization.** / Consult your caregiver. 2 doses, 6 to 18 months apart.  Hepatitis B immunization.** / Consult your caregiver. 3 doses, usually  over 6 months. Ages 59 and over  Blood pressure check.** / Every 1 to 2 years.  Lipid and cholesterol check.** / Every 5 years beginning at age 40.  Clinical breast exam.** / Every year after age 41.  Mammogram.** / Every year beginning at age 64 and continuing for as long as you are in good health. Consult with your caregiver.  Pap test.** / Every 3 years starting at age 52 through age 62 or 71 with a 3 consecutive normal Pap tests. Testing can be stopped between 65 and 70 with 3 consecutive normal Pap tests and no abnormal Pap or HPV tests in the past 10 years.  HPV screening.** / Every 3 years from ages 77 through ages 66 or 17 with a history of 3 consecutive normal Pap tests. Testing can be stopped between 65 and 70 with 3 consecutive normal Pap tests and no abnormal Pap or HPV tests in the past 10 years.  Fecal occult blood test (FOBT) of stool. / Every year beginning at age 45 and continuing until age 44. You may not need to do this test if you get a colonoscopy every 10 years.  Flexible sigmoidoscopy or colonoscopy.** / Every 5 years for a flexible sigmoidoscopy or every 10 years for a colonoscopy beginning at age 14 and continuing until age 61.  Hepatitis C blood test.** / For all people born from [birth year] through 1963 and any individual with known risks for hepatitis C.  Osteoporosis screening.** / A one-time screening for women ages 3 and over and women at risk for fractures or osteoporosis.  Skin self-exam. / Monthly.  Influenza immunization.** / Every year.  Pneumococcal polysaccharide immunization.** / 1 dose at age 65 (or older) if you have never been vaccinated.  Tetanus, diphtheria, pertussis (Tdap, Td) immunization. / A one-time dose of Tdap vaccine if you are over 65 and have contact with an infant, are a Research scientist (physical sciences), or simply want to be protected from whooping cough. After that, you need a Td booster dose every 10 years.  Varicella immunization.** / Consult your  caregiver.  Meningococcal immunization.** / Consult your caregiver.  Hepatitis A immunization.** / Consult your caregiver. 2 doses, 6 to 18 months apart.  Hepatitis B immunization.** / Check with your caregiver. 3 doses, usually over 6 months. ** Family history and personal history of risk and conditions may change your caregiver's recommendations. Document Released: 06/29/2001 Document Revised: 07/26/2011 Document Reviewed: 09/28/2010 [facility] Patient Information 2013 [place], [place].

## 2012-03-31 NOTE — Progress Notes (Signed)
Chief Complaint  Patient presents with  . Annual Exam    HPI: Patient comes in today for Preventive Health Care visit  No major change in health status since last visit . Sees gyne for pa  utd for pap mammo  2 15 and normal had dexa 2 years ago with osteopenia and to get another one soon. Uses vagifem prn no rx meds otherwise has allergies stable  ROS:  GEN/ HEENT: No fever, significant weight changes sweats headaches vision problems hearing changes, CV/ PULM; No chest pain shortness of breath cough, syncope,edema  change in exercise tolerance. GI /GU: No adominal pain, vomiting, change in bowel habits. No blood in the stool. No significant GU symptoms. SKIN/HEME: ,no acute skin rashes suspicious lesions or bleeding. No lymphadenopathy, nodules, masses.  NEURO/ PSYCH:  No neurologic signs such as weakness numbness. No depression anxiety. IMM/ Allergy: No unusual infections.  Allergy .   REST of 12 system review negative except as per HPI   Past Medical History  Diagnosis Date  . Allergic rhinitis     allergist evaluation spring fall and dust.  . GERD (gastroesophageal reflux disease)   . Ectopic pregnancy     x 2  . Hematuria, microscopic     eval 2001  Dr Vonita Moss    Family History  Problem Relation Age of Onset  . Parkinsonism    . Arthritis    . Colon cancer    . Hypertension    . Colon cancer Father   . Colon cancer Paternal Uncle     History   Social History  . Marital Status: Married    Spouse Name: N/A    Number of Children: N/A  . Years of Education: N/A   Social History Main Topics  . Smoking status: Never Smoker   . Smokeless tobacco: Never Used  . Alcohol Use: Yes  . Drug Use: None  . Sexually Active: None   Other Topics Concern  . None   Social History Narrative   MarriedHH of 2 and rescue dogExercisesG2Husband recovering from severe myocardiopathy viral and chf getting better will not need a heart transplantHusband  Retired this year.      Outpatient Encounter Prescriptions as of 03/31/2012  Medication Sig Dispense Refill  . Calcium-Vitamin D-Vitamin K (VIACTIV PO) Take by mouth.        . cetirizine (ZYRTEC) 10 MG tablet Take 10 mg by mouth daily.        . Estradiol (VAGIFEM) 10 MCG TABS Place vaginally.        . fish oil-omega-3 fatty acids 1000 MG capsule Take 2 g by mouth daily.        . Multiple Vitamins-Minerals (MULTIVITAMIN WITH MINERALS) tablet Take 1 tablet by mouth daily.        . NON FORMULARY Allergy shots       . psyllium (METAMUCIL) 58.6 % packet Take 1 packet by mouth daily.         EXAM:  BP 114/80  Pulse 83  Temp 97.6 F (36.4 C) (Oral)  Ht 5' 1.75" (1.568 m)  Wt 159 lb (72.122 kg)  BMI 29.32 kg/m2  SpO2 95%  Body mass index is 29.32 kg/(m^2).  Physical Exam: Vital signs reviewed WUJ:WJXB is a well-developed well-nourished alert cooperative   female who appears her stated age in no acute distress.  HEENT: normocephalic atraumatic , Eyes: PERRL EOM's full, conjunctiva clear, Nares: paten,t no deformity discharge or tenderness., Ears: no deformity EAC's clear TMs with  normal landmarks. Mouth: clear OP, no lesions, edema.  Moist mucous membranes. Dentition in adequate repair. NECK: supple without masses, thyromegaly or bruits. CHEST/PULM:  Clear to auscultation and percussion breath sounds equal no wheeze , rales or rhonchi. No chest wall deformities or tenderness. mild kyphosis  Breast: normal by inspection . No dimpling, discharge, masses, tenderness or discharge . CV: PMI is nondisplaced, S1 S2 no gallops, murmurs, rubs. Peripheral pulses are full without delay.No JVD .  ABDOMEN: Bowel sounds normal nontender  No guard or rebound, no hepato splenomegal no CVA tenderness.  No hernia. Extremtities:  No clubbing cyanosis or edema, no acute joint swelling or redness no focal atrophy NEURO:  Oriented x3, cranial nerves 3-12 appear to be intact, no obvious focal weakness,gait within normal limits no  abnormal reflexes or asymmetrical SKIN: No acute rashes normal turgor, color, no bruising or petechiae. PSYCH: Oriented, good eye contact, no obvious depression anxiety, cognition and judgment appear normal. LN: no cervical axillary inguinal adenopathy  Lab Results  Component Value Date   WBC 5.2 03/24/2012   HGB 14.6 03/24/2012   HCT 44.5 03/24/2012   PLT 289.0 03/24/2012   GLUCOSE 90 03/24/2012   CHOL 191 03/24/2012   TRIG 77.0 03/24/2012   HDL 50.80 03/24/2012   LDLDIRECT 132.6 04/16/2011   LDLCALC 125* 03/24/2012   ALT 22 03/24/2012   AST 22 03/24/2012   NA 142 03/24/2012   K 4.2 03/24/2012   CL 107 03/24/2012   CREATININE 0.8 03/24/2012   BUN 16 03/24/2012   CO2 27 03/24/2012   TSH 1.04 03/24/2012    ASSESSMENT AND PLAN:  Discussed the following assessment and plan: Preventive Health Care Counseled regarding healthy nutrition, exercise, sleep, injury prevention, calcium vit d and healthy weight . utd on  Pap mammo/ flu  dexa due had osteopenia.   1. Visit for preventive health examination    zostavax next year if possible  Patient Instructions  Continue lifestyle intervention healthy eating and exercise .  Please have copy of dexa  mammogram to be sent to  [place] for our records when done.  Preventive Care for Adults, Female A healthy lifestyle and preventive care can promote health and wellness. Preventive health guidelines for women include the following key practices.  A routine yearly physical is a good way to check with your caregiver about your health and preventive screening. It is a chance to share any concerns and updates on your health, and to receive a thorough exam.  Visit your dentist for a routine exam and preventive care every 6 months. Brush your teeth twice a day and floss once a day. Good oral hygiene prevents tooth decay and gum disease.  The frequency of eye exams is based on your age, health, family medical history, use of contact lenses, and other factors.  Follow your caregiver's recommendations for frequency of eye exams.  Eat a healthy diet. Foods like vegetables, fruits, whole grains, low-fat dairy products, and lean protein foods contain the nutrients you need without too many calories. Decrease your intake of foods high in solid fats, added sugars, and salt. Eat the right amount of calories for you.Get information about a proper diet from your caregiver, if necessary.  Regular physical exercise is one of the most important things you can do for your health. Most adults should get at least 150 minutes of moderate-intensity exercise (any activity that increases your heart rate and causes you to sweat) each week. In addition, most adults need muscle-strengthening exercises  on 2 or more days a week.  Maintain a healthy weight. The body mass index (BMI) is a screening tool to identify possible weight problems. It provides an estimate of body fat based on height and weight. Your caregiver can help determine your BMI, and can help you achieve or maintain a healthy weight.For adults 20 years and older:  A BMI below 18.5 is considered underweight.  A BMI of 18.5 to 24.9 is normal.  A BMI of 25 to 29.9 is considered overweight.  A BMI of 30 and above is considered obese.  Maintain normal blood lipids and cholesterol levels by exercising and minimizing your intake of saturated fat. Eat a balanced diet with plenty of fruit and vegetables. Blood tests for lipids and cholesterol should begin at age 44 and be repeated every 5 years. If your lipid or cholesterol levels are high, you are over 50, or you are at high risk for heart disease, you may need your cholesterol levels checked more frequently.Ongoing high lipid and cholesterol levels should be treated with medicines if diet and exercise are not effective.  If you smoke, find out from your caregiver how to quit. If you do not use tobacco, do not start.  If you are pregnant, do not drink alcohol. If you  are breastfeeding, be very cautious about drinking alcohol. If you are not pregnant and choose to drink alcohol, do not exceed 1 drink per day. One drink is considered to be 12 ounces (355 mL) of beer, 5 ounces (148 mL) of wine, or 1.5 ounces (44 mL) of liquor.  Avoid use of street drugs. Do not share needles with anyone. Ask for help if you need support or instructions about stopping the use of drugs.  High blood pressure causes heart disease and increases the risk of stroke. Your blood pressure should be checked at least every 1 to 2 years. Ongoing high blood pressure should be treated with medicines if weight loss and exercise are not effective.  If you are 61 to 59 years old, ask your caregiver if you should take aspirin to prevent strokes.  Diabetes screening involves taking a blood sample to check your fasting blood sugar level. This should be done once every 3 years, after age 32, if you are within normal weight and without risk factors for diabetes. Testing should be considered at a younger age or be carried out more frequently if you are overweight and have at least 1 risk factor for diabetes.  Breast cancer screening is essential preventive care for women. You should practice "breast self-awareness." This means understanding the normal appearance and feel of your breasts and may include breast self-examination. Any changes detected, no matter how small, should be reported to a caregiver. Women in their 75s and 30s should have a clinical breast exam (CBE) by a caregiver as part of a regular health exam every 1 to 3 years. After age 65, women should have a CBE every year. Starting at age 97, women should consider having a mammography (breast X-ray test) every year. Women who have a family history of breast cancer should talk to their caregiver about genetic screening. Women at a high risk of breast cancer should talk to their caregivers about having magnetic resonance imaging (MRI) and a  mammography every year.  The Pap test is a screening test for cervical cancer. A Pap test can show cell changes on the cervix that might become cervical cancer if left untreated. A Pap test is a procedure  in which cells are obtained and examined from the lower end of the uterus (cervix).  Women should have a Pap test starting at age 78.  Between ages 16 and 71, Pap tests should be repeated every 2 years.  Beginning at age 40, you should have a Pap test every 3 years as long as the past 3 Pap tests have been normal.  Some women have medical problems that increase the chance of getting cervical cancer. Talk to your caregiver about these problems. It is especially important to talk to your caregiver if a new problem develops soon after your last Pap test. In these cases, your caregiver may recommend more frequent screening and Pap tests.  The above recommendations are the same for women who have or have not gotten the vaccine for human papillomavirus (HPV).  If you had a hysterectomy for a problem that was not cancer or a condition that could lead to cancer, then you no longer need Pap tests. Even if you no longer need a Pap test, a regular exam is a good idea to make sure no other problems are starting.  If you are between ages 60 and 22, and you have had normal Pap tests going back 10 years, you no longer need Pap tests. Even if you no longer need a Pap test, a regular exam is a good idea to make sure no other problems are starting.  If you have had past treatment for cervical cancer or a condition that could lead to cancer, you need Pap tests and screening for cancer for at least 20 years after your treatment.  If Pap tests have been discontinued, risk factors (such as a new sexual partner) need to be reassessed to determine if screening should be resumed.  The HPV test is an additional test that may be used for cervical cancer screening. The HPV test looks for the virus that can cause the cell  changes on the cervix. The cells collected during the Pap test can be tested for HPV. The HPV test could be used to screen women aged 66 years and older, and should be used in women of any age who have unclear Pap test results. After the age of 66, women should have HPV testing at the same frequency as a Pap test.  Colorectal cancer can be detected and often prevented. Most routine colorectal cancer screening begins at the age of 66 and continues through age 58. However, your caregiver may recommend screening at an earlier age if you have risk factors for colon cancer. On a yearly basis, your caregiver may provide home test kits to check for hidden blood in the stool. Use of a small camera at the end of a tube, to directly examine the colon (sigmoidoscopy or colonoscopy), can detect the earliest forms of colorectal cancer. Talk to your caregiver about this at age 74, when routine screening begins. Direct examination of the colon should be repeated every 5 to 10 years through age 81, unless early forms of pre-cancerous polyps or small growths are found.  Hepatitis C blood testing is recommended for all people born from [birth year] through 1965 and any individual with known risks for hepatitis C.  Practice safe sex. Use condoms and avoid high-risk sexual practices to reduce the spread of sexually transmitted infections (STIs). STIs include gonorrhea, chlamydia, syphilis, trichomonas, herpes, HPV, and human immunodeficiency virus (HIV). Herpes, HIV, and HPV are viral illnesses that have no cure. They can result in disability, cancer, and death. Sexually  active women aged 80 and younger should be checked for chlamydia. Older women with new or multiple partners should also be tested for chlamydia. Testing for other STIs is recommended if you are sexually active and at increased risk.  Osteoporosis is a disease in which the bones lose minerals and strength with aging. This can result in serious bone fractures. The risk  of osteoporosis can be identified using a bone density scan. Women ages 6 and over and women at risk for fractures or osteoporosis should discuss screening with their caregivers. Ask your caregiver whether you should take a calcium supplement or vitamin D to reduce the rate of osteoporosis.  Menopause can be associated with physical symptoms and risks. Hormone replacement therapy is available to decrease symptoms and risks. You should talk to your caregiver about whether hormone replacement therapy is right for you.  Use sunscreen with sun protection factor (SPF) of 30 or more. Apply sunscreen liberally and repeatedly throughout the day. You should seek shade when your shadow is shorter than you. Protect yourself by wearing long sleeves, pants, a wide-brimmed hat, and sunglasses year round, whenever you are outdoors.  Once a month, do a whole body skin exam, using a mirror to look at the skin on your back. Notify your caregiver of new moles, moles that have irregular borders, moles that are larger than a pencil eraser, or moles that have changed in shape or color.  Stay current with required immunizations.  Influenza. You need a dose every fall (or winter). The composition of the flu vaccine changes each year, so being vaccinated once is not enough.  Pneumococcal polysaccharide. You need 1 to 2 doses if you smoke cigarettes or if you have certain chronic medical conditions. You need 1 dose at age 51 (or older) if you have never been vaccinated.  Tetanus, diphtheria, pertussis (Tdap, Td). Get 1 dose of Tdap vaccine if you are younger than age 7, are over 27 and have contact with an infant, are a Research scientist (physical sciences), are pregnant, or simply want to be protected from whooping cough. After that, you need a Td booster dose every 10 years. Consult your caregiver if you have not had at least 3 tetanus and diphtheria-containing shots sometime in your life or have a deep or dirty wound.  HPV. You need this  vaccine if you are a woman age 30 or younger. The vaccine is given in 3 doses over 6 months.  Measles, mumps, rubella (MMR). You need at least 1 dose of MMR if you were born in 1957 or later. You may also need a second dose.  Meningococcal. If you are age 87 to 37 and a first-year college student living in a residence hall, or have one of several medical conditions, you need to get vaccinated against meningococcal disease. You may also need additional booster doses.  Zoster (shingles). If you are age 65 or older, you should get this vaccine.  Varicella (chickenpox). If you have never had chickenpox or you were vaccinated but received only 1 dose, talk to your caregiver to find out if you need this vaccine.  Hepatitis A. You need this vaccine if you have a specific risk factor for hepatitis A virus infection or you simply wish to be protected from this disease. The vaccine is usually given as 2 doses, 6 to 18 months apart.  Hepatitis B. You need this vaccine if you have a specific risk factor for hepatitis B virus infection or you simply wish to be  protected from this disease. The vaccine is given in 3 doses, usually over 6 months. Preventive Services / Frequency Ages 14 to 28  Blood pressure check.** / Every 1 to 2 years.  Lipid and cholesterol check.** / Every 5 years beginning at age 25.  Clinical breast exam.** / Every year after age 60.  Mammogram.** / Every year beginning at age 34 and continuing for as long as you are in good health. Consult with your caregiver.  Pap test.** / Every 3 years starting at age 49 through age 38 or 16 with a history of 3 consecutive normal Pap tests.  HPV screening.** / Every 3 years from ages 28 through ages 73 to 44 with a history of 3 consecutive normal Pap tests.  Fecal occult blood test (FOBT) of stool. / Every year beginning at age 61 and continuing until age 75. You may not need to do this test if you get a colonoscopy every 10 years.  Flexible  sigmoidoscopy or colonoscopy.** / Every 5 years for a flexible sigmoidoscopy or every 10 years for a colonoscopy beginning at age 85 and continuing until age 66.  Hepatitis C blood test.** / For all people born from [birth year] through 1963 and any individual with known risks for hepatitis C.  Skin self-exam. / Monthly.  Influenza immunization.** / Every year.  Pneumococcal polysaccharide immunization.** / 1 to 2 doses if you smoke cigarettes or if you have certain chronic medical conditions.  Tetanus, diphtheria, pertussis (Tdap, Td) immunization.** / A one-time dose of Tdap vaccine. After that, you need a Td booster dose every 10 years.  Measles, mumps, rubella (MMR) immunization. / You need at least 1 dose of MMR if you were born in 1957 or later. You may also need a second dose.  Varicella immunization.** / Consult your caregiver.  Meningococcal immunization.** / Consult your caregiver.  Hepatitis A immunization.** / Consult your caregiver. 2 doses, 6 to 18 months apart.  Hepatitis B immunization.** / Consult your caregiver. 3 doses, usually over 6 months. Ages 80 and over  Blood pressure check.** / Every 1 to 2 years.  Lipid and cholesterol check.** / Every 5 years beginning at age 58.  Clinical breast exam.** / Every year after age 41.  Mammogram.** / Every year beginning at age 80 and continuing for as long as you are in good health. Consult with your caregiver.  Pap test.** / Every 3 years starting at age 26 through age 39 or 70 with a 3 consecutive normal Pap tests. Testing can be stopped between 65 and 70 with 3 consecutive normal Pap tests and no abnormal Pap or HPV tests in the past 10 years.  HPV screening.** / Every 3 years from ages 52 through ages 40 or 64 with a history of 3 consecutive normal Pap tests. Testing can be stopped between 65 and 70 with 3 consecutive normal Pap tests and no abnormal Pap or HPV tests in the past 10 years.  Fecal occult blood test (FOBT) of  stool. / Every year beginning at age 36 and continuing until age 50. You may not need to do this test if you get a colonoscopy every 10 years.  Flexible sigmoidoscopy or colonoscopy.** / Every 5 years for a flexible sigmoidoscopy or every 10 years for a colonoscopy beginning at age 42 and continuing until age 76.  Hepatitis C blood test.** / For all people born from [birth year] through 1965 and any individual with known risks for hepatitis C.  Osteoporosis screening.** /  A one-time screening for women ages 36 and over and women at risk for fractures or osteoporosis.  Skin self-exam. / Monthly.  Influenza immunization.** / Every year.  Pneumococcal polysaccharide immunization.** / 1 dose at age 17 (or older) if you have never been vaccinated.  Tetanus, diphtheria, pertussis (Tdap, Td) immunization. / A one-time dose of Tdap vaccine if you are over 65 and have contact with an infant, are a Research scientist (physical sciences), or simply want to be protected from whooping cough. After that, you need a Td booster dose every 10 years.  Varicella immunization.** / Consult your caregiver.  Meningococcal immunization.** / Consult your caregiver.  Hepatitis A immunization.** / Consult your caregiver. 2 doses, 6 to 18 months apart.  Hepatitis B immunization.** / Check with your caregiver. 3 doses, usually over 6 months. ** Family history and personal history of risk and conditions may change your caregiver's recommendations. Document Released: 06/29/2001 Document Revised: 07/26/2011 Document Reviewed: 09/28/2010 [facility] Patient Information 2013 [place], [place].    Neta Mends. Panosh M.D.

## 2012-07-01 ENCOUNTER — Other Ambulatory Visit: Payer: Self-pay

## 2012-07-10 LAB — HM MAMMOGRAPHY

## 2012-07-13 ENCOUNTER — Encounter: Payer: Self-pay | Admitting: Internal Medicine

## 2012-11-20 ENCOUNTER — Ambulatory Visit (INDEPENDENT_AMBULATORY_CARE_PROVIDER_SITE_OTHER): Payer: BC Managed Care – PPO | Admitting: Family Medicine

## 2012-11-20 DIAGNOSIS — Z2911 Encounter for prophylactic immunotherapy for respiratory syncytial virus (RSV): Secondary | ICD-10-CM

## 2012-11-20 DIAGNOSIS — Z23 Encounter for immunization: Secondary | ICD-10-CM

## 2012-12-21 ENCOUNTER — Telehealth: Payer: Self-pay | Admitting: Internal Medicine

## 2012-12-21 NOTE — Telephone Encounter (Signed)
Routine labs  Not necessary every year  .  Thus dont order  Without a reason.  Or ov  Las labs were 11 13  And were good  Why is she asking for labs without a diagnosis .  ?   She may need to pay for this out of pocket.

## 2012-12-21 NOTE — Telephone Encounter (Signed)
Had a physical last November.  Please advise.  Thanks!!

## 2012-12-21 NOTE — Telephone Encounter (Signed)
PT is stating that she would like to come in for CPX labs only. She states that she does not need a cpx, she needs labs only. Please assist.

## 2012-12-22 NOTE — Telephone Encounter (Signed)
Patient has new insurance from when she had last CPX labs.  However, this insurance will expire the end of Sept.  She would like to have lab work for V70.0.  Please advise.  Thanks!!

## 2013-01-01 NOTE — Telephone Encounter (Signed)
  If patient doesn't want preventive visit  Then plan  Reg ov  And we can do indicated  labs at the visit  In her time frame .

## 2013-01-02 NOTE — Telephone Encounter (Signed)
Left message with husband to have her call office / [place]

## 2013-01-02 NOTE — Telephone Encounter (Signed)
Scheduled

## 2013-01-25 ENCOUNTER — Ambulatory Visit (INDEPENDENT_AMBULATORY_CARE_PROVIDER_SITE_OTHER): Payer: BC Managed Care – PPO | Admitting: Internal Medicine

## 2013-01-25 ENCOUNTER — Encounter: Payer: Self-pay | Admitting: Internal Medicine

## 2013-01-25 VITALS — BP 138/86 | HR 61 | Temp 97.8°F | Wt 158.0 lb

## 2013-01-25 DIAGNOSIS — E785 Hyperlipidemia, unspecified: Secondary | ICD-10-CM

## 2013-01-25 DIAGNOSIS — Z23 Encounter for immunization: Secondary | ICD-10-CM

## 2013-01-25 DIAGNOSIS — Z0000 Encounter for general adult medical examination without abnormal findings: Secondary | ICD-10-CM

## 2013-01-25 LAB — BASIC METABOLIC PANEL
Calcium: 9.8 mg/dL (ref 8.4–10.5)
GFR: 69.62 mL/min (ref >60.00)
Glucose, Bld: 97 mg/dL (ref 70–99)
Potassium: 5 mEq/L (ref 3.5–5.1)
Sodium: 140 mEq/L (ref 135–145)

## 2013-01-25 LAB — HEPATIC FUNCTION PANEL
Albumin: 4 g/dL (ref 3.5–5.2)
Alkaline Phosphatase: 48 U/L (ref 39–117)
Total Bilirubin: 0.6 mg/dL (ref 0.3–1.2)

## 2013-01-25 LAB — LIPID PANEL
Cholesterol: 186 mg/dL (ref 0–200)
HDL: 53 mg/dL (ref >39.00)
LDL Cholesterol: 117 mg/dL — ABNORMAL HIGH (ref 0–99)
VLDL: 15.6 mg/dL (ref 0.0–40.0)

## 2013-01-25 LAB — TSH: TSH: 1.26 u[IU]/mL (ref 0.35–5.50)

## 2013-01-25 NOTE — Progress Notes (Signed)
Chief Complaint  Patient presents with  . Follow-up    Is fasting and would like to have physical labs today.    HPI: Patient comes in as requested because she asks about yearly blood work. His fasting. She's generally well has new insurance appears to be up-to-date on health care maintenance Just gave blood  And was ok.  Donation 3 x per year.  Sleep  Ok at least 6 hours  8 hours  .   Negative tobacco alcohol does some exercise. Never had elevated blood pressure readings. She sees Dr. Loma Boston for OB/GYN. She's had elevated cholesterol levels but is negative family history for premature heart attack stroke or vascular disease. Husband has illness she helps care take. Has a chronic cough intermittent from allergies and she is on allergy shots. Is a history of GERD but this is stable. Has a history of microscopic hematuria with a negative evaluation.  ROS: See pertinent positives and negatives per HPI. Neg as per hpi   Past Medical History  Diagnosis Date  . Allergic rhinitis     allergist evaluation spring fall and dust.  . GERD (gastroesophageal reflux disease)   . Ectopic pregnancy     x 2  . Hematuria, microscopic     eval 2001  Dr Vonita Moss    Family History  Problem Relation Age of Onset  . Parkinsonism    . Arthritis    . Colon cancer    . Hypertension    . Colon cancer Father   . Colon cancer Paternal Uncle     History   Social History  . Marital Status: Married    Spouse Name: N/A    Number of Children: N/A  . Years of Education: N/A   Social History Main Topics  . Smoking status: Never Smoker   . Smokeless tobacco: Never Used  . Alcohol Use: Yes  . Drug Use: None  . Sexual Activity: None   Other Topics Concern  . None   Social History Narrative   Married   HH of 2 and rescue dog   Exercises   G2   Husband recovering from severe myocardiopathy viral and chf getting better will not need a heart transplant   Husband  Retired this year.    No ets.     [employer]traking  Family elederly.     Outpatient Encounter Prescriptions as of 01/25/2013  Medication Sig Dispense Refill  . Calcium-Vitamin D-Vitamin K (VIACTIV PO) Take by mouth.        . cetirizine (ZYRTEC) 10 MG tablet Take 10 mg by mouth daily.        . Estradiol (VAGIFEM) 10 MCG TABS Place vaginally.        . fish oil-omega-3 fatty acids 1000 MG capsule Take 2 g by mouth daily.        . Multiple Vitamins-Minerals (MULTIVITAMIN WITH MINERALS) tablet Take 1 tablet by mouth daily.        . NON FORMULARY Allergy shots       . psyllium (METAMUCIL) 58.6 % packet Take 1 packet by mouth daily.         No facility-administered encounter medications on file as of 01/25/2013.    EXAM:  BP 138/86  Pulse 61  Temp(Src) 97.8 F (36.6 C) (Oral)  Wt 158 lb (71.668 kg)  BMI 29.15 kg/m2  SpO2 98%  Body mass index is 29.15 kg/(m^2).  GENERAL: vitals reviewed and listed above, alert, oriented, appears well hydrated and in no  acute distress an occasional dry cough. HEENT: atraumatic, conjunctiva  clear, no obvious abnormalities on inspection of external nose and ears OP : no lesion edema or exudate  NECK: no obvious masses on inspection palpation  LUNGS: clear to auscultation bilaterally, no wheezes, rales or rhonchi, good air movement CV: HRRR, no clubbing cyanosis or  peripheral edema nl cap refill  Abdomen:  Sof,t normal bowel sounds without hepatosplenomegaly, no guarding rebound or masses no CVA tenderness MS: moves all extremities without noticeable focal  abnormality PSYCH: pleasant and cooperative, no obvious depression or anxiety  ASSESSMENT AND PLAN:  Discussed the following assessment and plan:  Visit for preventive health examination - Plan: Basic metabolic panel, Hepatic function panel, Lipid panel, TSH  Need for prophylactic vaccination and inoculation against influenza - Plan: Flu Vaccine QUAD 36+ mos PF IM (Fluarix), Basic metabolic panel, Hepatic function panel, Lipid panel,  TSH  Other and unspecified hyperlipidemia - Plan: Basic metabolic panel, Hepatic function panel, Lipid panel, TSH Directed exam health care measures discussed followup blood pressure if needed reviewed labs screen for lipids thyroid chemistry no need to check for anemia she has had check when she donates blood. Counseled regarding healthy nutrition, exercise, sleep, injury prevention, calcium vit d and healthy weight . Flu vaccine today  -Patient advised to return or notify health care team  if symptoms worsen or persist or new concerns arise.  Patient Instructions  Continue lifestyle intervention healthy eating and exercise . Check bp  At home as discussed to make sure normal. Continue lifestyle intervention healthy eating and exercise .       Kimberly Schroeder. Altus Zaino M.D.

## 2013-01-25 NOTE — Patient Instructions (Signed)
Continue lifestyle intervention healthy eating and exercise . Check bp  At home as discussed to make sure normal. Continue lifestyle intervention healthy eating and exercise .

## 2013-01-26 ENCOUNTER — Encounter: Payer: Self-pay | Admitting: Family Medicine

## 2014-02-12 ENCOUNTER — Ambulatory Visit (INDEPENDENT_AMBULATORY_CARE_PROVIDER_SITE_OTHER): Payer: BC Managed Care – PPO | Admitting: Family Medicine

## 2014-02-12 DIAGNOSIS — Z23 Encounter for immunization: Secondary | ICD-10-CM

## 2014-03-18 ENCOUNTER — Encounter: Payer: Self-pay | Admitting: Internal Medicine

## 2014-05-18 ENCOUNTER — Telehealth: Payer: Self-pay | Admitting: Family

## 2014-05-18 DIAGNOSIS — N390 Urinary tract infection, site not specified: Secondary | ICD-10-CM

## 2014-05-18 MED ORDER — SULFAMETHOXAZOLE-TRIMETHOPRIM 800-160 MG PO TABS: 1.0000 | ORAL_TABLET | Freq: Two times a day (BID) | ORAL | Status: DC

## 2014-05-18 NOTE — Progress Notes (Signed)

## 2015-03-20 ENCOUNTER — Other Ambulatory Visit (INDEPENDENT_AMBULATORY_CARE_PROVIDER_SITE_OTHER): Payer: BLUE CROSS/BLUE SHIELD

## 2015-03-20 DIAGNOSIS — Z0000 Encounter for general adult medical examination without abnormal findings: Secondary | ICD-10-CM | POA: Diagnosis not present

## 2015-03-20 LAB — CBC WITH DIFFERENTIAL/PLATELET
BASOS PCT: 0.8 % (ref 0.0–3.0)
Basophils Absolute: 0 10*3/uL (ref 0.0–0.1)
EOS ABS: 0.2 10*3/uL (ref 0.0–0.7)
Eosinophils Relative: 5.7 % — ABNORMAL HIGH (ref 0.0–5.0)
HCT: 41 % (ref 36.0–46.0)
HEMOGLOBIN: 13.5 g/dL (ref 12.0–15.0)
Lymphocytes Relative: 37.8 % (ref 12.0–46.0)
Lymphs Abs: 1.7 10*3/uL (ref 0.7–4.0)
MCHC: 33 g/dL (ref 30.0–36.0)
MCV: 88.3 fl (ref 78.0–100.0)
MONO ABS: 0.4 10*3/uL (ref 0.1–1.0)
Monocytes Relative: 8.1 % (ref 3.0–12.0)
Neutro Abs: 2.1 10*3/uL (ref 1.4–7.7)
Neutrophils Relative %: 47.6 % (ref 43.0–77.0)
Platelets: 375 10*3/uL (ref 150.0–400.0)
RBC: 4.64 Mil/uL (ref 3.87–5.11)
RDW: 13.1 % (ref 11.5–15.5)
WBC: 4.4 10*3/uL (ref 4.0–10.5)

## 2015-03-20 LAB — LIPID PANEL
CHOL/HDL RATIO: 3
Cholesterol: 180 mg/dL (ref 0–200)
HDL: 61.4 mg/dL (ref >39.00)
LDL CALC: 106 mg/dL — AB (ref 0–99)
NonHDL: 118.87
TRIGLYCERIDES: 66 mg/dL (ref 0.0–149.0)
VLDL: 13.2 mg/dL (ref 0.0–40.0)

## 2015-03-20 LAB — HEPATIC FUNCTION PANEL
ALT: 17 U/L (ref 0–35)
AST: 19 U/L (ref 0–37)
Albumin: 4.1 g/dL (ref 3.5–5.2)
Alkaline Phosphatase: 60 U/L (ref 39–117)
BILIRUBIN DIRECT: 0.1 mg/dL (ref 0.0–0.3)
BILIRUBIN TOTAL: 0.5 mg/dL (ref 0.2–1.2)
Total Protein: 7 g/dL (ref 6.0–8.3)

## 2015-03-20 LAB — TSH: TSH: 1.48 u[IU]/mL (ref 0.35–4.50)

## 2015-03-20 LAB — BASIC METABOLIC PANEL
BUN: 12 mg/dL (ref 6–23)
CO2: 28 mEq/L (ref 19–32)
CREATININE: 0.9 mg/dL (ref 0.40–1.20)
Calcium: 10 mg/dL (ref 8.4–10.5)
Chloride: 108 mEq/L (ref 96–112)
GFR: 67.35 mL/min (ref >60.00)
Glucose, Bld: 100 mg/dL — ABNORMAL HIGH (ref 70–99)
Potassium: 5.1 mEq/L (ref 3.5–5.1)
Sodium: 144 mEq/L (ref 135–145)

## 2015-03-26 ENCOUNTER — Ambulatory Visit (INDEPENDENT_AMBULATORY_CARE_PROVIDER_SITE_OTHER): Payer: BLUE CROSS/BLUE SHIELD | Admitting: Internal Medicine

## 2015-03-26 ENCOUNTER — Encounter: Payer: Self-pay | Admitting: Internal Medicine

## 2015-03-26 VITALS — BP 126/80 | Temp 98.1°F | Ht 61.0 in | Wt 150.9 lb

## 2015-03-26 DIAGNOSIS — Z0000 Encounter for general adult medical examination without abnormal findings: Secondary | ICD-10-CM | POA: Diagnosis not present

## 2015-03-26 NOTE — Patient Instructions (Addendum)
Continue lifestyle intervention healthy eating and exercise . Recheck in a year.     Health Maintenance, Female Adopting a healthy lifestyle and getting preventive care can go a long way to promote health and wellness. Talk with your health care provider about what schedule of regular examinations is right for you. This is a good chance for you to check in with your provider about disease prevention and staying healthy. In between checkups, there are plenty of things you can do on your own. Experts have done a lot of research about which lifestyle changes and preventive measures are most likely to keep you healthy. Ask your health care provider for more information. WEIGHT AND DIET  Eat a healthy diet  Be sure to include plenty of vegetables, fruits, low-fat dairy products, and lean protein.  Do not eat a lot of foods high in solid fats, added sugars, or salt.  Get regular exercise. This is one of the most important things you can do for your health.  Most adults should exercise for at least 150 minutes each week. The exercise should increase your heart rate and make you sweat (moderate-intensity exercise).  Most adults should also do strengthening exercises at least twice a week. This is in addition to the moderate-intensity exercise.  Maintain a healthy weight  Body mass index (BMI) is a measurement that can be used to identify possible weight problems. It estimates body fat based on height and weight. Your health care provider can help determine your BMI and help you achieve or maintain a healthy weight.  For females 53 years of age and older:   A BMI below 18.5 is considered underweight.  A BMI of 18.5 to 24.9 is normal.  A BMI of 25 to 29.9 is considered overweight.  A BMI of 30 and above is considered obese.  Watch levels of cholesterol and blood lipids  You should start having your blood tested for lipids and cholesterol at 62 years of age, then have this test every 5  years.  You may need to have your cholesterol levels checked more often if:  Your lipid or cholesterol levels are high.  You are older than 62 years of age.  You are at high risk for heart disease.  CANCER SCREENING   Lung Cancer  Lung cancer screening is recommended for adults 44-70 years old who are at high risk for lung cancer because of a history of smoking.  A yearly low-dose CT scan of the lungs is recommended for people who:  Currently smoke.  Have quit within the past 15 years.  Have at least a 30-pack-year history of smoking. A pack year is smoking an average of one pack of cigarettes a day for 1 year.  Yearly screening should continue until it has been 15 years since you quit.  Yearly screening should stop if you develop a health problem that would prevent you from having lung cancer treatment.  Breast Cancer  Practice breast self-awareness. This means understanding how your breasts normally appear and feel.  It also means doing regular breast self-exams. Let your health care provider know about any changes, no matter how small.  If you are in your 20s or 30s, you should have a clinical breast exam (CBE) by a health care provider every 1-3 years as part of a regular health exam.  If you are 62 or older, have a CBE every year. Also consider having a breast X-ray (mammogram) every year.  If you have a family  history of breast cancer, talk to your health care provider about genetic screening.  If you are at high risk for breast cancer, talk to your health care provider about having an MRI and a mammogram every year.  Breast cancer gene (BRCA) assessment is recommended for women who have family members with BRCA-related cancers. BRCA-related cancers include:  Breast.  Ovarian.  Tubal.  Peritoneal cancers.  Results of the assessment will determine the need for genetic counseling and BRCA1 and BRCA2 testing. Cervical Cancer Your health care provider may  recommend that you be screened regularly for cancer of the pelvic organs (ovaries, uterus, and vagina). This screening involves a pelvic examination, including checking for microscopic changes to the surface of your cervix (Pap test). You may be encouraged to have this screening done every 3 years, beginning at age 77.  For women ages 5-65, health care providers may recommend pelvic exams and Pap testing every 3 years, or they may recommend the Pap and pelvic exam, combined with testing for human papilloma virus (HPV), every 5 years. Some types of HPV increase your risk of cervical cancer. Testing for HPV may also be done on women of any age with unclear Pap test results.  Other health care providers may not recommend any screening for nonpregnant women who are considered low risk for pelvic cancer and who do not have symptoms. Ask your health care provider if a screening pelvic exam is right for you.  If you have had past treatment for cervical cancer or a condition that could lead to cancer, you need Pap tests and screening for cancer for at least 20 years after your treatment. If Pap tests have been discontinued, your risk factors (such as having a new sexual partner) need to be reassessed to determine if screening should resume. Some women have medical problems that increase the chance of getting cervical cancer. In these cases, your health care provider may recommend more frequent screening and Pap tests. Colorectal Cancer  This type of cancer can be detected and often prevented.  Routine colorectal cancer screening usually begins at 62 years of age and continues through 62 years of age.  Your health care provider may recommend screening at an earlier age if you have risk factors for colon cancer.  Your health care provider may also recommend using home test kits to check for hidden blood in the stool.  A small camera at the end of a tube can be used to examine your colon directly  (sigmoidoscopy or colonoscopy). This is done to check for the earliest forms of colorectal cancer.  Routine screening usually begins at age 34.  Direct examination of the colon should be repeated every 5-10 years through 62 years of age. However, you may need to be screened more often if early forms of precancerous polyps or small growths are found. Skin Cancer  Check your skin from head to toe regularly.  Tell your health care provider about any new moles or changes in moles, especially if there is a change in a mole's shape or color.  Also tell your health care provider if you have a mole that is larger than the size of a pencil eraser.  Always use sunscreen. Apply sunscreen liberally and repeatedly throughout the day.  Protect yourself by wearing long sleeves, pants, a wide-brimmed hat, and sunglasses whenever you are outside. HEART DISEASE, DIABETES, AND HIGH BLOOD PRESSURE   High blood pressure causes heart disease and increases the risk of stroke. High blood pressure  is more likely to develop in:  People who have blood pressure in the high end of the normal range (130-139/85-89 mm Hg).  People who are overweight or obese.  People who are African American.  If you are 20-25 years of age, have your blood pressure checked every 3-5 years. If you are 71 years of age or older, have your blood pressure checked every year. You should have your blood pressure measured twice--once when you are at a hospital or clinic, and once when you are not at a hospital or clinic. Record the average of the two measurements. To check your blood pressure when you are not at a hospital or clinic, you can use:  An automated blood pressure machine at a pharmacy.  A home blood pressure monitor.  If you are between 41 years and 9 years old, ask your health care provider if you should take aspirin to prevent strokes.  Have regular diabetes screenings. This involves taking a blood sample to check your  fasting blood sugar level.  If you are at a normal weight and have a low risk for diabetes, have this test once every three years after 62 years of age.  If you are overweight and have a high risk for diabetes, consider being tested at a younger age or more often. PREVENTING INFECTION  Hepatitis B  If you have a higher risk for hepatitis B, you should be screened for this virus. You are considered at high risk for hepatitis B if:  You were born in a country where hepatitis B is common. Ask your health care provider which countries are considered high risk.  Your parents were born in a high-risk country, and you have not been immunized against hepatitis B (hepatitis B vaccine).  You have HIV or AIDS.  You use needles to inject street drugs.  You live with someone who has hepatitis B.  You have had sex with someone who has hepatitis B.  You get hemodialysis treatment.  You take certain medicines for conditions, including cancer, organ transplantation, and autoimmune conditions. Hepatitis C  Blood testing is recommended for:  Everyone born from [birth year] through 1963.  Anyone with known risk factors for hepatitis C. Sexually transmitted infections (STIs)  You should be screened for sexually transmitted infections (STIs) including gonorrhea and chlamydia if:  You are sexually active and are younger than 62 years of age.  You are older than 62 years of age and your health care provider tells you that you are at risk for this type of infection.  Your sexual activity has changed since you were last screened and you are at an increased risk for chlamydia or gonorrhea. Ask your health care provider if you are at risk.  If you do not have HIV, but are at risk, it may be recommended that you take a prescription medicine daily to prevent HIV infection. This is called pre-exposure prophylaxis (PrEP). You are considered at risk if:  You are sexually active and do not regularly use condoms or  know the HIV status of your partner(s).  You take drugs by injection.  You are sexually active with a partner who has HIV. Talk with your health care provider about whether you are at high risk of being infected with HIV. If you choose to begin PrEP, you should first be tested for HIV. You should then be tested every 3 months for as long as you are taking PrEP.  PREGNANCY   If you are premenopausal and  you may become pregnant, ask your health care provider about preconception counseling.  If you may become pregnant, take 400 to 800 micrograms (mcg) of folic acid every day.  If you want to prevent pregnancy, talk to your health care provider about birth control (contraception). OSTEOPOROSIS AND MENOPAUSE   Osteoporosis is a disease in which the bones lose minerals and strength with aging. This can result in serious bone fractures. Your risk for osteoporosis can be identified using a bone density scan.  If you are 54 years of age or older, or if you are at risk for osteoporosis and fractures, ask your health care provider if you should be screened.  Ask your health care provider whether you should take a calcium or vitamin D supplement to lower your risk for osteoporosis.  Menopause may have certain physical symptoms and risks.  Hormone replacement therapy may reduce some of these symptoms and risks. Talk to your health care provider about whether hormone replacement therapy is right for you.  HOME CARE INSTRUCTIONS   Schedule regular health, dental, and eye exams.  Stay current with your immunizations.   Do not use any tobacco products including cigarettes, chewing tobacco, or electronic cigarettes.  If you are pregnant, do not drink alcohol.  If you are breastfeeding, limit how much and how often you drink alcohol.  Limit alcohol intake to no more than 1 drink per day for nonpregnant women. One drink equals 12 ounces of beer, 5 ounces of wine, or 1 ounces of hard liquor.  Do  not use street drugs.  Do not share needles.  Ask your health care provider for help if you need support or information about quitting drugs.  Tell your health care provider if you often feel depressed.  Tell your health care provider if you have ever been abused or do not feel safe at home.   This information is not intended to replace advice given to you by your health care provider. Make sure you discuss any questions you have with your health care provider.   Document Released: 11/16/2010 Document Revised: 05/24/2014 Document Reviewed: 04/04/2013 Elsevier Interactive Patient Education [employer].    Why follow it? Research shows. . Those who follow the Mediterranean diet have a reduced risk of heart disease  . The diet is associated with a reduced incidence of Parkinson's and Alzheimer's diseases . People following the diet may have longer life expectancies and lower rates of chronic diseases  . The Dietary Guidelines for Americans recommends the Mediterranean diet as an eating plan to promote health and prevent disease  What Is the Mediterranean Diet?  . Healthy eating plan based on typical foods and recipes of Mediterranean-style cooking . The diet is primarily a plant based diet; these foods should make up a majority of meals   Starches - Plant based foods should make up a majority of meals - They are an important sources of vitamins, minerals, energy, antioxidants, and fiber - Choose whole grains, foods high in fiber and minimally processed items  - Typical grain sources include wheat, oats, barley, corn, brown rice, bulgar, farro, millet, polenta, couscous  - Various types of beans include chickpeas, lentils, fava beans, black beans, white beans   Fruits  Veggies - Large quantities of antioxidant rich fruits & veggies; 6 or more servings  - Vegetables can be eaten raw or lightly drizzled with oil and cooked  - Vegetables common to the traditional Mediterranean Diet  include: artichokes, arugula, beets, broccoli, brussel  sprouts, cabbage, carrots, celery, collard greens, cucumbers, eggplant, kale, leeks, lemons, lettuce, mushrooms, okra, onions, peas, peppers, potatoes, pumpkin, radishes, rutabaga, shallots, spinach, sweet potatoes, turnips, zucchini - Fruits common to the Mediterranean Diet include: apples, apricots, avocados, cherries, clementines, dates, figs, grapefruits, grapes, melons, nectarines, oranges, peaches, pears, pomegranates, strawberries, tangerines  Fats - Replace butter and margarine with healthy oils, such as olive oil, canola oil, and tahini  - Limit nuts to no more than a handful a day  - Nuts include walnuts, almonds, pecans, pistachios, pine nuts  - Limit or avoid candied, honey roasted or heavily salted nuts - Olives are central to the [employer] - can be eaten whole or used in a variety of dishes   Meats Protein - Limiting red meat: no more than a few times a month - When eating red meat: choose lean cuts and keep the portion to the size of deck of cards - Eggs: approx. 0 to 4 times a week  - Fish and lean poultry: at least 2 a week  - Healthy protein sources include, chicken, [place], lean beef, lamb - Increase intake of seafood such as tuna, salmon, trout, mackerel, shrimp, scallops - Avoid or limit high fat processed meats such as sausage and bacon  Dairy - Include moderate amounts of low fat dairy products  - Focus on healthy dairy such as fat free yogurt, skim milk, low or reduced fat cheese - Limit dairy products higher in fat such as whole or 2% milk, cheese, ice cream  Alcohol - Moderate amounts of red wine is ok  - No more than 5 oz daily for women (all ages) and men older than age 69  - No more than 10 oz of wine daily for men younger than 19  Other - Limit sweets and other desserts  - Use herbs and spices instead of salt to flavor foods  - Herbs and spices common to the traditional Mediterranean Diet include:  basil, bay leaves, chives, cloves, cumin, fennel, garlic, lavender, marjoram, mint, oregano, parsley, pepper, rosemary, sage, savory, sumac, tarragon, thyme   It's not just a diet, it's a lifestyle:  . The Mediterranean diet includes lifestyle factors typical of those in the region  . Foods, drinks and meals are best eaten with others and savored . Daily physical activity is important for overall good health . This could be strenuous exercise like running and aerobics . This could also be more leisurely activities such as walking, housework, yard-work, or taking the stairs . Moderation is the key; a balanced and healthy diet accommodates most foods and drinks . Consider portion sizes and frequency of consumption of certain foods   Meal Ideas & Options:  . Breakfast:  o Whole wheat toast or whole wheat English muffins with peanut butter & hard boiled egg o Steel cut oats topped with apples & cinnamon and skim milk  o Fresh fruit: banana, strawberries, melon, berries, peaches  o Smoothies: strawberries, bananas, greek yogurt, peanut butter o Low fat greek yogurt with blueberries and granola  o Egg white omelet with spinach and mushrooms o Breakfast couscous: whole wheat couscous, apricots, skim milk, cranberries  . Sandwiches:  o Hummus and grilled vegetables (peppers, zucchini, squash) on whole wheat bread   o Grilled chicken on whole wheat pita with lettuce, tomatoes, cucumbers or tzatziki  o Tuna salad on whole wheat bread: tuna salad made with greek yogurt, olives, red peppers, capers, green onions o Garlic rosemary lamb pita: lamb sauted with  garlic, rosemary, salt & pepper; add lettuce, cucumber, greek yogurt to pita - flavor with lemon juice and black pepper  . Seafood:  o Mediterranean grilled salmon, seasoned with garlic, basil, parsley, lemon juice and black pepper o Shrimp, lemon, and spinach whole-grain pasta salad made with low fat greek yogurt  o Seared scallops with lemon  orzo  o Seared tuna steaks seasoned salt, pepper, coriander topped with tomato mixture of olives, tomatoes, olive oil, minced garlic, parsley, green onions and cappers  . Meats:  o Herbed greek chicken salad with kalamata olives, cucumber, feta  o Red bell peppers stuffed with spinach, bulgur, lean ground beef (or lentils) & topped with feta   o Kebabs: skewers of chicken, tomatoes, onions, zucchini, squash  o [place] burgers: made with red onions, mint, dill, lemon juice, feta cheese topped with roasted red peppers . Vegetarian o Cucumber salad: cucumbers, artichoke hearts, celery, red onion, feta cheese, tossed in olive oil & lemon juice  o Hummus and whole grain pita points with a greek salad (lettuce, tomato, feta, olives, cucumbers, red onion) o Lentil soup with celery, carrots made with vegetable broth, garlic, salt and pepper  o Tabouli salad: parsley, bulgur, mint, scallions, cucumbers, tomato, radishes, lemon juice, olive oil, salt and pepper.

## 2015-03-26 NOTE — Progress Notes (Signed)
Pre visit review using our clinic review tool, if applicable. No additional management support is needed unless otherwise documented below in the visit note.  Chief Complaint  Patient presents with  . Annual Exam    HPI: Patient  Kimberly Schroeder  64 y.o. comes in today for Preventive Health Care visit   Health Maintenance  Topic Date Due  . Hepatitis C Screening  03/25/2016 (Originally August 06, 1950)  . HIV Screening  03/25/2016 (Originally 11/09/1965)  . INFLUENZA VACCINE  12/16/2015  . TETANUS/TDAP  04/28/2016  . MAMMOGRAM  07/15/2016  . PAP SMEAR  07/15/2017  . COLONOSCOPY  04/20/2021  . ZOSTAVAX  Completed   Health Maintenance Review LIFESTYLE:  Exercise:  y healthy diet usually Tobacco/ETS:n Alcohol: ocass Sugar beverages:ocass Sleep:7-9 Drug use: no  ROS:  Getting laser on teeth bone loss  On allergy shots  Dr Donneta Romberg GEN/ HEENT: No fever, significant weight changes sweats headaches vision problems hearing changes, CV/ PULM; No chest pain shortness of breath cough, syncope,edema  change in exercise tolerance. GI /GU: No adominal pain, vomiting, change in bowel habits. No blood in the stool. No significant GU symptoms. SKIN/HEME: ,no acute skin rashes suspicious lesions or bleeding. No lymphadenopathy, nodules, masses.  NEURO/ PSYCH:  No neurologic signs such as weakness numbness. No depression anxiety. IMM/ Allergy: No unusual infections.  Allergy .   REST of 12 system review negative except as per HPI   Past Medical History  Diagnosis Date  . Allergic rhinitis     allergist evaluation spring fall and dust.  . GERD (gastroesophageal reflux disease)   . Ectopic pregnancy     x 2  . Hematuria, microscopic     eval 2001  Dr Terance Hart    Past Surgical History  Procedure Laterality Date  . Laparoscopy      ectopic pregnancy    Family History  Problem Relation Age of Onset  . Parkinsonism    . Arthritis    . Colon cancer    . Hypertension    . Colon cancer  Father   . Colon cancer Paternal Uncle     Social History   Social History  . Marital Status: Married    Spouse Name: N/A  . Number of Children: N/A  . Years of Education: N/A   Social History Main Topics  . Smoking status: Never Smoker   . Smokeless tobacco: Never Used  . Alcohol Use: Yes  . Drug Use: None  . Sexual Activity: Not Asked   Other Topics Concern  . None   Social History Narrative   Married   HH of 2 and rescue dog   Exercises   G2   Husband recovering from severe myocardiopathy viral and chf getting better will not need a heart transplant   Husband  Retired this year.    No ets.    [employer]king  Family elederly.     Outpatient Prescriptions Prior to Visit  Medication Sig Dispense Refill  . Calcium-Vitamin D-Vitamin K (VIACTIV PO) Take by mouth.      . cetirizine (ZYRTEC) 10 MG tablet Take 10 mg by mouth daily.      . Estradiol (VAGIFEM) 10 MCG TABS Place vaginally.      . fish oil-omega-3 fatty acids 1000 MG capsule Take 2 g by mouth daily.      . Multiple Vitamins-Minerals (MULTIVITAMIN WITH MINERALS) tablet Take 1 tablet by mouth daily.      . NON FORMULARY Allergy shots     .  psyllium (METAMUCIL) 58.6 % packet Take 1 packet by mouth daily.      Marland Kitchen sulfamethoxazole-trimethoprim (BACTRIM DS,SEPTRA DS) 800-160 MG per tablet Take 1 tablet by mouth 2 (two) times daily. 10 tablet 0   No facility-administered medications prior to visit.     EXAM:  BP 126/80 mmHg  Temp(Src) 98.1 F (36.7 C) (Oral)  Ht 5' 1"  (1.549 m)  Wt 150 lb 14.4 oz (68.448 kg)  BMI 28.53 kg/m2  Body mass index is 28.53 kg/(m^2).  Physical Exam: Vital signs reviewed HTD:SKAJ is a well-developed well-nourished alert cooperative    who appearsr stated age in no acute distress.  HEENT: normocephalic atraumatic , Eyes: PERRL EOM's full, conjunctiva clear, Nares: paten,t no deformity discharge or tenderness., Ears: no deformity EAC's clear TMs with normal landmarks. Mouth: clear OP,  no lesions, edema.  Moist mucous membranes. Dentition in adequate repair. NECK: supple without masses, thyromegaly or bruits. CHEST/PULM:  Clear to auscultation and percussion breath sounds equal no wheeze , rales or rhonchi. No chest wall deformities or tenderness.Breast: normal by inspection . No dimpling, discharge, masses, tenderness or discharge .  CV: PMI is nondisplaced, S1 S2 no gallops, murmurs, rubs. Peripheral pulses are full without delay.No JVD .  ABDOMEN: Bowel sounds normal nontender  No guard or rebound, no hepato splenomegal no CVA tenderness.  No hernia. Extremtities:  No clubbing cyanosis or edema, no acute joint swelling or redness no focal atrophy NEURO:  Oriented x3, cranial nerves 3-12 appear to be intact, no obvious focal weakness,gait within normal limits no abnormal reflexes or asymmetrical SKIN: No acute rashes normal turgor, color, no bruising or petechiae. PSYCH: Oriented, good eye contact, no obvious depression anxiety, cognition and judgment appear normal. LN: no cervical axillary inguinal adenopathy Pelvic per gyne .   utd.  Lab Results  Component Value Date   WBC 4.4 03/20/2015   HGB 13.5 03/20/2015   HCT 41.0 03/20/2015   PLT 375.0 03/20/2015   GLUCOSE 100* 03/20/2015   CHOL 180 03/20/2015   TRIG 66.0 03/20/2015   HDL 61.40 03/20/2015   LDLDIRECT 132.6 04/16/2011   LDLCALC 106* 03/20/2015   ALT 17 03/20/2015   AST 19 03/20/2015   NA 144 03/20/2015   K 5.1 03/20/2015   CL 108 03/20/2015   CREATININE 0.90 03/20/2015   BUN 12 03/20/2015   CO2 28 03/20/2015   TSH 1.48 03/20/2015    ASSESSMENT AND PLAN:  Discussed the following assessment and plan:  Visit for preventive health examination - utd  donates blood  regarly no need for hiv hep c screen  Patient Care Team: Burnis Medin, MD as PCP - General Mosetta Anis, MD (Allergy) Dian Queen, MD (Obstetrics and Gynecology) Patient Instructions   Continue lifestyle intervention healthy  eating and exercise . Recheck in a year.     Health Maintenance, Female Adopting a healthy lifestyle and getting preventive care can go a long way to promote health and wellness. Talk with your health care provider about what schedule of regular examinations is right for you. This is a good chance for you to check in with your provider about disease prevention and staying healthy. In between checkups, there are plenty of things you can do on your own. Experts have done a lot of research about which lifestyle changes and preventive measures are most likely to keep you healthy. Ask your health care provider for more information. WEIGHT AND DIET  Eat a healthy diet  Be sure to include plenty  of vegetables, fruits, low-fat dairy products, and lean protein.  Do not eat a lot of foods high in solid fats, added sugars, or salt.  Get regular exercise. This is one of the most important things you can do for your health.  Most adults should exercise for at least 150 minutes each week. The exercise should increase your heart rate and make you sweat (moderate-intensity exercise).  Most adults should also do strengthening exercises at least twice a week. This is in addition to the moderate-intensity exercise.  Maintain a healthy weight  Body mass index (BMI) is a measurement that can be used to identify possible weight problems. It estimates body fat based on height and weight. Your health care provider can help determine your BMI and help you achieve or maintain a healthy weight.  For females 17 years of age and older:   A BMI below 18.5 is considered underweight.  A BMI of 18.5 to 24.9 is normal.  A BMI of 25 to 29.9 is considered overweight.  A BMI of 30 and above is considered obese.  Watch levels of cholesterol and blood lipids  You should start having your blood tested for lipids and cholesterol at 62 years of age, then have this test every 5 years.  You may need to have your  cholesterol levels checked more often if:  Your lipid or cholesterol levels are high.  You are older than 62 years of age.  You are at high risk for heart disease.  CANCER SCREENING   Lung Cancer  Lung cancer screening is recommended for adults 70-82 years old who are at high risk for lung cancer because of a history of smoking.  A yearly low-dose CT scan of the lungs is recommended for people who:  Currently smoke.  Have quit within the past 15 years.  Have at least a 30-pack-year history of smoking. A pack year is smoking an average of one pack of cigarettes a day for 1 year.  Yearly screening should continue until it has been 15 years since you quit.  Yearly screening should stop if you develop a health problem that would prevent you from having lung cancer treatment.  Breast Cancer  Practice breast self-awareness. This means understanding how your breasts normally appear and feel.  It also means doing regular breast self-exams. Let your health care provider know about any changes, no matter how small.  If you are in your 20s or 30s, you should have a clinical breast exam (CBE) by a health care provider every 1-3 years as part of a regular health exam.  If you are 29 or older, have a CBE every year. Also consider having a breast X-ray (mammogram) every year.  If you have a family history of breast cancer, talk to your health care provider about genetic screening.  If you are at high risk for breast cancer, talk to your health care provider about having an MRI and a mammogram every year.  Breast cancer gene (BRCA) assessment is recommended for women who have family members with BRCA-related cancers. BRCA-related cancers include:  Breast.  Ovarian.  Tubal.  Peritoneal cancers.  Results of the assessment will determine the need for genetic counseling and BRCA1 and BRCA2 testing. Cervical Cancer Your health care provider may recommend that you be screened regularly  for cancer of the pelvic organs (ovaries, uterus, and vagina). This screening involves a pelvic examination, including checking for microscopic changes to the surface of your cervix (Pap test). You may  be encouraged to have this screening done every 3 years, beginning at age 37.  For women ages 12-65, health care providers may recommend pelvic exams and Pap testing every 3 years, or they may recommend the Pap and pelvic exam, combined with testing for human papilloma virus (HPV), every 5 years. Some types of HPV increase your risk of cervical cancer. Testing for HPV may also be done on women of any age with unclear Pap test results.  Other health care providers may not recommend any screening for nonpregnant women who are considered low risk for pelvic cancer and who do not have symptoms. Ask your health care provider if a screening pelvic exam is right for you.  If you have had past treatment for cervical cancer or a condition that could lead to cancer, you need Pap tests and screening for cancer for at least 20 years after your treatment. If Pap tests have been discontinued, your risk factors (such as having a new sexual partner) need to be reassessed to determine if screening should resume. Some women have medical problems that increase the chance of getting cervical cancer. In these cases, your health care provider may recommend more frequent screening and Pap tests. Colorectal Cancer  This type of cancer can be detected and often prevented.  Routine colorectal cancer screening usually begins at 62 years of age and continues through 62 years of age.  Your health care provider may recommend screening at an earlier age if you have risk factors for colon cancer.  Your health care provider may also recommend using home test kits to check for hidden blood in the stool.  A small camera at the end of a tube can be used to examine your colon directly (sigmoidoscopy or colonoscopy). This is done to check  for the earliest forms of colorectal cancer.  Routine screening usually begins at age 6.  Direct examination of the colon should be repeated every 5-10 years through 62 years of age. However, you may need to be screened more often if early forms of precancerous polyps or small growths are found. Skin Cancer  Check your skin from head to toe regularly.  Tell your health care provider about any new moles or changes in moles, especially if there is a change in a mole's shape or color.  Also tell your health care provider if you have a mole that is larger than the size of a pencil eraser.  Always use sunscreen. Apply sunscreen liberally and repeatedly throughout the day.  Protect yourself by wearing long sleeves, pants, a wide-brimmed hat, and sunglasses whenever you are outside. HEART DISEASE, DIABETES, AND HIGH BLOOD PRESSURE   High blood pressure causes heart disease and increases the risk of stroke. High blood pressure is more likely to develop in:  People who have blood pressure in the high end of the normal range (130-139/85-89 mm Hg).  People who are overweight or obese.  People who are African American.  If you are 49-53 years of age, have your blood pressure checked every 3-5 years. If you are 71 years of age or older, have your blood pressure checked every year. You should have your blood pressure measured twice--once when you are at a hospital or clinic, and once when you are not at a hospital or clinic. Record the average of the two measurements. To check your blood pressure when you are not at a hospital or clinic, you can use:  An automated blood pressure machine at a pharmacy.  A home blood pressure monitor.  If you are between 36 years and 50 years old, ask your health care provider if you should take aspirin to prevent strokes.  Have regular diabetes screenings. This involves taking a blood sample to check your fasting blood sugar level.  If you are at a normal  weight and have a low risk for diabetes, have this test once every three years after 62 years of age.  If you are overweight and have a high risk for diabetes, consider being tested at a younger age or more often. PREVENTING INFECTION  Hepatitis B  If you have a higher risk for hepatitis B, you should be screened for this virus. You are considered at high risk for hepatitis B if:  You were born in a country where hepatitis B is common. Ask your health care provider which countries are considered high risk.  Your parents were born in a high-risk country, and you have not been immunized against hepatitis B (hepatitis B vaccine).  You have HIV or AIDS.  You use needles to inject street drugs.  You live with someone who has hepatitis B.  You have had sex with someone who has hepatitis B.  You get hemodialysis treatment.  You take certain medicines for conditions, including cancer, organ transplantation, and autoimmune conditions. Hepatitis C  Blood testing is recommended for:  Everyone born from [birth year] through 1963.  Anyone with known risk factors for hepatitis C. Sexually transmitted infections (STIs)  You should be screened for sexually transmitted infections (STIs) including gonorrhea and chlamydia if:  You are sexually active and are younger than 62 years of age.  You are older than 62 years of age and your health care provider tells you that you are at risk for this type of infection.  Your sexual activity has changed since you were last screened and you are at an increased risk for chlamydia or gonorrhea. Ask your health care provider if you are at risk.  If you do not have HIV, but are at risk, it may be recommended that you take a prescription medicine daily to prevent HIV infection. This is called pre-exposure prophylaxis (PrEP). You are considered at risk if:  You are sexually active and do not regularly use condoms or know the HIV status of your partner(s).  You take  drugs by injection.  You are sexually active with a partner who has HIV. Talk with your health care provider about whether you are at high risk of being infected with HIV. If you choose to begin PrEP, you should first be tested for HIV. You should then be tested every 3 months for as long as you are taking PrEP.  PREGNANCY   If you are premenopausal and you may become pregnant, ask your health care provider about preconception counseling.  If you may become pregnant, take 400 to 800 micrograms (mcg) of folic acid every day.  If you want to prevent pregnancy, talk to your health care provider about birth control (contraception). OSTEOPOROSIS AND MENOPAUSE   Osteoporosis is a disease in which the bones lose minerals and strength with aging. This can result in serious bone fractures. Your risk for osteoporosis can be identified using a bone density scan.  If you are 104 years of age or older, or if you are at risk for osteoporosis and fractures, ask your health care provider if you should be screened.  Ask your health care provider whether you should take a calcium or vitamin D  supplement to lower your risk for osteoporosis.  Menopause may have certain physical symptoms and risks.  Hormone replacement therapy may reduce some of these symptoms and risks. Talk to your health care provider about whether hormone replacement therapy is right for you.  HOME CARE INSTRUCTIONS   Schedule regular health, dental, and eye exams.  Stay current with your immunizations.   Do not use any tobacco products including cigarettes, chewing tobacco, or electronic cigarettes.  If you are pregnant, do not drink alcohol.  If you are breastfeeding, limit how much and how often you drink alcohol.  Limit alcohol intake to no more than 1 drink per day for nonpregnant women. One drink equals 12 ounces of beer, 5 ounces of wine, or 1 ounces of hard liquor.  Do not use street drugs.  Do not share  needles.  Ask your health care provider for help if you need support or information about quitting drugs.  Tell your health care provider if you often feel depressed.  Tell your health care provider if you have ever been abused or do not feel safe at home.   This information is not intended to replace advice given to you by your health care provider. Make sure you discuss any questions you have with your health care provider.   Document Released: 11/16/2010 Document Revised: 05/24/2014 Document Reviewed: 04/04/2013 Elsevier Interactive Patient Education [employer].    Why follow it? Research shows. . Those who follow the Mediterranean diet have a reduced risk of heart disease  . The diet is associated with a reduced incidence of Parkinson's and Alzheimer's diseases . People following the diet may have longer life expectancies and lower rates of chronic diseases  . The Dietary Guidelines for Americans recommends the Mediterranean diet as an eating plan to promote health and prevent disease  What Is the Mediterranean Diet?  . Healthy eating plan based on typical foods and recipes of Mediterranean-style cooking . The diet is primarily a plant based diet; these foods should make up a majority of meals   Starches - Plant based foods should make up a majority of meals - They are an important sources of vitamins, minerals, energy, antioxidants, and fiber - Choose whole grains, foods high in fiber and minimally processed items  - Typical grain sources include wheat, oats, barley, corn, brown rice, bulgar, farro, millet, polenta, couscous  - Various types of beans include chickpeas, lentils, fava beans, black beans, white beans   Fruits  Veggies - Large quantities of antioxidant rich fruits & veggies; 6 or more servings  - Vegetables can be eaten raw or lightly drizzled with oil and cooked  - Vegetables common to the traditional Mediterranean Diet include: artichokes, arugula, beets,  broccoli, brussel sprouts, cabbage, carrots, celery, collard greens, cucumbers, eggplant, kale, leeks, lemons, lettuce, mushrooms, okra, onions, peas, peppers, potatoes, pumpkin, radishes, rutabaga, shallots, spinach, sweet potatoes, turnips, zucchini - Fruits common to the Mediterranean Diet include: apples, apricots, avocados, cherries, clementines, dates, figs, grapefruits, grapes, melons, nectarines, oranges, peaches, pears, pomegranates, strawberries, tangerines  Fats - Replace butter and margarine with healthy oils, such as olive oil, canola oil, and tahini  - Limit nuts to no more than a handful a day  - Nuts include walnuts, almonds, pecans, pistachios, pine nuts  - Limit or avoid candied, honey roasted or heavily salted nuts - Olives are central to the Mediterranean diet - can be eaten whole or used in a variety of dishes   Meats Protein -  Limiting red meat: no more than a few times a month - When eating red meat: choose lean cuts and keep the portion to the size of deck of cards - Eggs: approx. 0 to 4 times a week  - Fish and lean poultry: at least 2 a week  - Healthy protein sources include, chicken, [place], lean beef, lamb - Increase intake of seafood such as tuna, salmon, trout, mackerel, shrimp, scallops - Avoid or limit high fat processed meats such as sausage and bacon  Dairy - Include moderate amounts of low fat dairy products  - Focus on healthy dairy such as fat free yogurt, skim milk, low or reduced fat cheese - Limit dairy products higher in fat such as whole or 2% milk, cheese, ice cream  Alcohol - Moderate amounts of red wine is ok  - No more than 5 oz daily for women (all ages) and men older than age 87  - No more than 10 oz of wine daily for men younger than 60  Other - Limit sweets and other desserts  - Use herbs and spices instead of salt to flavor foods  - Herbs and spices common to the traditional Mediterranean Diet include: basil, bay leaves, chives, cloves, cumin,  fennel, garlic, lavender, marjoram, mint, oregano, parsley, pepper, rosemary, sage, savory, sumac, tarragon, thyme   It's not just a diet, it's a lifestyle:  . The Mediterranean diet includes lifestyle factors typical of those in the region  . Foods, drinks and meals are best eaten with others and savored . Daily physical activity is important for overall good health . This could be strenuous exercise like running and aerobics . This could also be more leisurely activities such as walking, housework, yard-work, or taking the stairs . Moderation is the key; a balanced and healthy diet accommodates most foods and drinks . Consider portion sizes and frequency of consumption of certain foods   Meal Ideas & Options:  . Breakfast:  o Whole wheat toast or whole wheat English muffins with peanut butter & hard boiled egg o Steel cut oats topped with apples & cinnamon and skim milk  o Fresh fruit: banana, strawberries, melon, berries, peaches  o Smoothies: strawberries, bananas, greek yogurt, peanut butter o Low fat greek yogurt with blueberries and granola  o Egg white omelet with spinach and mushrooms o Breakfast couscous: whole wheat couscous, apricots, skim milk, cranberries  . Sandwiches:  o Hummus and grilled vegetables (peppers, zucchini, squash) on whole wheat bread   o Grilled chicken on whole wheat pita with lettuce, tomatoes, cucumbers or tzatziki  o Tuna salad on whole wheat bread: tuna salad made with greek yogurt, olives, red peppers, capers, green onions o Garlic rosemary lamb pita: lamb sauted with garlic, rosemary, salt & pepper; add lettuce, cucumber, greek yogurt to pita - flavor with lemon juice and black pepper  . Seafood:  o Mediterranean grilled salmon, seasoned with garlic, basil, parsley, lemon juice and black pepper o Shrimp, lemon, and spinach whole-grain pasta salad made with low fat greek yogurt  o Seared scallops with lemon orzo  o Seared tuna steaks seasoned salt,  pepper, coriander topped with tomato mixture of olives, tomatoes, olive oil, minced garlic, parsley, green onions and cappers  . Meats:  o Herbed greek chicken salad with kalamata olives, cucumber, feta  o Red bell peppers stuffed with spinach, bulgur, lean ground beef (or lentils) & topped with feta   o Kebabs: skewers of chicken, tomatoes, onions, zucchini, squash  o [place] burgers: made with red onions, mint, dill, lemon juice, feta cheese topped with roasted red peppers . Vegetarian o Cucumber salad: cucumbers, artichoke hearts, celery, red onion, feta cheese, tossed in olive oil & lemon juice  o Hummus and whole grain pita points with a greek salad (lettuce, tomato, feta, olives, cucumbers, red onion) o Lentil soup with celery, carrots made with vegetable broth, garlic, salt and pepper  o Tabouli salad: parsley, bulgur, mint, scallions, cucumbers, tomato, radishes, lemon juice, olive oil, salt and pepper.         Standley Brooking. Travis Mastel M.D.

## 2015-10-14 ENCOUNTER — Ambulatory Visit: Payer: BLUE CROSS/BLUE SHIELD | Attending: Orthopaedic Surgery | Admitting: Physical Therapy

## 2015-10-14 DIAGNOSIS — R293 Abnormal posture: Secondary | ICD-10-CM | POA: Diagnosis present

## 2015-10-14 DIAGNOSIS — R252 Cramp and spasm: Secondary | ICD-10-CM | POA: Insufficient documentation

## 2015-10-14 DIAGNOSIS — M25512 Pain in left shoulder: Secondary | ICD-10-CM | POA: Diagnosis present

## 2015-10-14 NOTE — Therapy (Signed)
[place], [place], [address] Phone: [phone]   Fax:  [phone]  Physical Therapy Evaluation  Patient Details  Name: Kimberly Schroeder MRN: [id number] Date of Birth: 02/05/1951 Referring Provider: Joni Fears MD  Encounter Date: 10/14/2015      PT End of Session - 10/14/15 1436    Visit Number 1   Number of Visits 13   Date for PT Re-Evaluation 11/25/15   Authorization Type [employer]   PT Start Time 1330   PT Stop Time 1418   PT Time Calculation (min) 48 min   Activity Tolerance Patient tolerated treatment well   Behavior During Therapy [facility] for tasks assessed/performed      Past Medical History  Diagnosis Date  . Allergic rhinitis     allergist evaluation spring fall and dust.  . GERD (gastroesophageal reflux disease)   . Ectopic pregnancy     x 2  . Hematuria, microscopic     eval 2001  Dr Terance Hart    Past Surgical History  Procedure Laterality Date  . Laparoscopy      ectopic pregnancy    There were no vitals filed for this visit.       Subjective Assessment - 10/14/15 1338    Subjective pt is a 65 y.o F with CC of L shoulder pain that 2 years ago with gradual worsenining with most recent exacerbation 6 weeks after mowing the lawn but reported pain in shoulder after mowng the lawn. pain radiates down to the mid-lateral hmerus and across the mid-lateral humerus, ptain is intermittent worse with lifting and activity.  Only 1 - 2  times o numbnes and tingling but not often.    Limitations Lifting;House hold activities  reaching   How long can you sit comfortably? unlimited   How long can you stand comfortably? unlimited   How long can you walk comfortably? unlimited   Diagnostic tests [facility] MRI and X-ray 1-2 weeks ago   Patient Stated Goals not make pain worse, education regarding use of machines, to avoid surgery, things not to do to make it worse.   Currently in Pain? Yes    Pain Score 1   1 advil before tx   Pain Location Shoulder   Pain Orientation Left   Pain Descriptors / Indicators Squeezing  can get more severe with lifting an dmoving around   Pain Type Chronic pain   Pain Onset More than a month ago   Pain Frequency Intermittent   Aggravating Factors  using the arm, reaching, carrying, lifting worse with weights,    Pain Relieving Factors wait it out, ice,             [facility] PT Assessment - 10/14/15 1320    Assessment   Medical Diagnosis L shoulder impingement   Referring Provider Joni Fears MD   Onset Date/Surgical Date --  2 years with recent excacerbation in 6 weeks   Hand Dominance Right   Next MD Visit Make one PRN   Prior Therapy No   Precautions   Precautions None   Restrictions   Weight Bearing Restrictions No   Balance Screen   Has the patient fallen in the past 6 months No   Has the patient had a decrease in activity level because of a fear of falling?  No   Is the patient reluctant to leave their home because of a fear of falling?  No   Home Environment   Living  Environment Private residence   Living Arrangements Spouse/significant other   Type of [place] to enter   Entrance Stairs-Number of Steps 3   [place] Two level   Alternate Level Stairs-Number of Steps [place] None   Prior Function   Level of Independence Independent;Independent with basic ADLs   Vocation Retired   Leisure walking, reading, gardening, singing   Cognition   Overall Cognitive Status Within Functional Limits for tasks assessed   Observation/Other Assessments   Focus on Therapeutic Outcomes (FOTO)  31% limitation  predicted 28% limited   Posture/Postural Control   Posture/Postural Control Postural limitations   Postural Limitations Rounded Shoulders;Forward head   ROM / Strength   AROM / PROM / Strength AROM;PROM;Strength   AROM   AROM Assessment Site Shoulder   Right/Left Shoulder Right;Left    Right Shoulder Extension 76 Degrees   Right Shoulder Flexion 156 Degrees   Right Shoulder ABduction 155 Degrees   Right Shoulder Internal Rotation 90 Degrees   Right Shoulder External Rotation 90 Degrees   Left Shoulder Extension 95 Degrees   Left Shoulder Flexion 156 Degrees   Left Shoulder ABduction 152 Degrees  pain noted at 90 degrees with increased pain with lowering   Left Shoulder Internal Rotation 88 Degrees   Left Shoulder External Rotation 70 Degrees   PROM   Overall PROM  Within functional limits for tasks performed   PROM Assessment Site Shoulder   Right/Left Shoulder Left   Strength   Strength Assessment Site Shoulder;Hand   Right/Left Shoulder Right;Left   Right Shoulder Flexion 4+/5   Right Shoulder Extension 5/5   Right Shoulder ABduction 4+/5   Right Shoulder Internal Rotation 4+/5   Right Shoulder External Rotation 4+/5   Left Shoulder Flexion 4+/5   Left Shoulder Extension 5/5   Left Shoulder ABduction 4+/5  pain during testing   Left Shoulder Internal Rotation 4+/5  pain during testing   Left Shoulder External Rotation 4+/5  pain during testing   Right Hand Grip (lbs) 46.3  47,44,48   Left Hand Grip (lbs) 44  51,41,40   Palpation   Palpation comment tendnerness at supraspinatus/infraspinatus and along the long head of the biceps tendons with tightness of the L upper trap and levator scapulae.     Special Tests    Special Tests Rotator Cuff Impingement   Rotator Cuff Impingment tests Luan Pulling- Kennedy test;Painful Arc of Motion;Full Can test;Empty Can test;other   Hawkins-Kennedy test   Findings Positive   Side Left   Comments horizontal adduction with pain noted in anterior aspect of shoulder    Empty Can test   Findings Positive   Side Left   Comment pain and weakness compard to open can   Full Can test   Findings Negative   Side Left   Painful Arc of Motion   Findings Positive   Side Left   other   Findings Positive   Side Left   Comments  scapular assist                           PT Education - 10/14/15 1433    Education provided Yes   Education Details evaluation findings, anatomy of the shoulder anatomy in regard to rythm of the shoulder blade and humerus, POC, Goals, HEP with reps/ sets and form   Person(s) Educated Patient   Methods Explanation;Demonstration;Handout   Comprehension Verbalized understanding  PT Short Term Goals - 10/14/15 1518    PT SHORT TERM GOAL #1   Title pt will be I with inital HEP (11/04/2015)   Time 3   Period Weeks   Status New   PT SHORT TERM GOAL #2   Title pt will be able to verbalize and demonstrate techniques to reduce L shoulder pain and inflamation via RICE (11/04/2015)   Time 3   Period Weeks   Status New           PT Long Term Goals - 10/14/15 1520    PT LONG TERM GOAL #1   Title pt will be I with all HEP as of last visit ( 11/25/2015)   Time 6   Period Weeks   Status New   PT LONG TERM GOAL #2   Title pt will demonstrate L shoulder AROM with </= 1/10 pain to promote pain free ADLs including but not limited to donning/ doffing clothing (11/25/2015)   Time 6   Period Weeks   Status New   PT LONG TERM GOAL #3   Title pt will improve L shoulder strength to >/= 4+/5 with </= 2/10 pain to promote functional lifting and carrying activities ( 11/25/2015)   Time 6   Period Weeks   Status New   PT LONG TERM GOAL #4   Title pt will be able to liflting/ carry >/= 8 # at shoulder height or higher with </= 2/10 pain  to promote functional overhead  ADLs and assist with pt's personal goal of return to the gym (11/25/2015)   Time 6   Period Weeks   Status New   PT LONG TERM GOAL #5   Title pt will improve FOTO score to >/= 72 to demonsrate improvement in function at discharge (11/25/2015)   Time [place] - 10/14/15 1436    Clinical Impression Statement Mrs. Greenlees presents to OPPT as a low complexity  evaluation with CC of L shoulder pain that has has intermittently gone on for 2 years with recent exacerbation in the alst 6 weeks from mowing the lawn. She demonstrates functional AROM with pain with flexion/ abduction and IR/ER, PROM was WNL. MMT revealed strength but pain during abuction and IR/ ER assessment. palpation was positive for tendnerness at supraspinatus/infraspinatus and along the long head of the biceps tendons with tightness of the L upper trap and levator scapulae.  special testing of positve hawkins kennedy, scapular assist testing, and painful arc, indicate high probability of impingment of the L shoulder. She would benefit from physical therapy to decreaes L shoulder pain, improve strength, increase endurance with lifting and carrying activities and return pt to PLOF by addressing the impairments listed.   pt presents as low complexity evaluation based on PMHx   Rehab Potential Good   PT Frequency 2x / week   PT Duration 6 weeks   PT Treatment/Interventions ADLs/Self Care Home Management;Cryotherapy;Electrical Stimulation;Iontophoresis 4mg /ml Dexamethasone;Moist Heat;Therapeutic exercise;Therapeutic activities;Manual techniques;Dry needling;Passive range of motion;Ultrasound;Balance training;Patient/family education;Taping   PT Next Visit Plan assess/ review HEP, scapular stability,    PT Home Exercise Plan rows, shoulder IR/ ER, ceiling punches, upper trap stretch   Consulted and Agree with Plan of Care Patient      Patient will benefit from skilled therapeutic intervention in order to improve the following deficits and impairments:  Pain, Decreased strength, Decreased mobility, Decreased  endurance, Decreased activity tolerance, Increased fascial restricitons, Impaired UE functional use, Improper body mechanics, Postural dysfunction, Decreased range of motion, Abnormal gait  Visit Diagnosis: Pain in left shoulder - Plan: PT plan of care cert/re-cert  Cramp and spasm - Plan: PT  plan of care cert/re-cert  Abnormal posture - Plan: PT plan of care cert/re-cert     Problem List Patient Active Problem List   Diagnosis Date Noted  . Hematuria 04/23/2011  . Other and unspecified hyperlipidemia 04/23/2011  . Allergic rhinitis   . ABDOMINAL PAIN, RIGHT UPPER QUADRANT, HX OF 11/11/2008  . ALLERGIC RHINITIS 01/26/2007  . GERD 01/26/2007   Starr Lake PT, DPT, LAT, ATC  10/14/2015  3:31 PM      [facility] [address] [place], [place], [address] Phone: [phone]   Fax:  [phone]  Name: Kimberly Schroeder MRN: [id number] Date of Birth: 1950/08/29

## 2015-10-15 ENCOUNTER — Ambulatory Visit: Payer: BLUE CROSS/BLUE SHIELD | Admitting: Physical Therapy

## 2015-10-15 DIAGNOSIS — M25512 Pain in left shoulder: Secondary | ICD-10-CM | POA: Diagnosis not present

## 2015-10-15 DIAGNOSIS — R252 Cramp and spasm: Secondary | ICD-10-CM

## 2015-10-15 DIAGNOSIS — R293 Abnormal posture: Secondary | ICD-10-CM

## 2015-10-15 NOTE — Patient Instructions (Addendum)
Over Head Pull: Narrow Grip       On back, knees bent, feet flat, band across thighs, elbows straight but relaxed. Pull hands apart (start). Keeping elbows straight, bring arms up and over head, hands toward floor. Keep pull steady on band. Hold momentarily. Return slowly, keeping pull steady, back to start. Repeat _10__ times. Band color __RED___   Side Pull: Double Arm   On back, knees bent, feet flat. Arms perpendicular to body, shoulder level, elbows straight but relaxed. Pull arms out to sides, elbows straight. Resistance band comes across collarbones, hands toward floor. Hold momentarily. Slowly return to starting position. Repeat 10___ times. Band color _RED____   Sash   On back, knees bent, feet flat, left hand on left hip, right hand above left. Pull right arm DIAGONALLY (hip to shoulder) across chest. Bring right arm along head toward floor. Hold momentarily. Slowly return to starting position. Repeat 10___ times. Do with left arm. Band color RED______   Shoulder Rotation: Double Arm   On back, knees bent, feet flat, elbows tucked at sides, bent 90, hands palms up. Pull hands apart and down toward floor, keeping elbows near sides. Hold momentarily. Slowly return to starting position. Repeat __10_ times. Band color _RED_____   From exercise drawer:  Doorway stretch, daily 1 x a day 3 X 30 seconds.

## 2015-10-15 NOTE — Therapy (Signed)
[place], [place], [address] Phone: [phone]   Fax:  [phone]  Physical Therapy Treatment  Patient Details  Name: Kimberly Schroeder MRN: [id number] Date of Birth: March 05, 1951 Referring Provider: Joni Fears MD  Encounter Date: 10/15/2015      PT End of Session - 10/15/15 1303    Visit Number 2   Number of Visits 13   Date for PT Re-Evaluation 11/25/15   PT Start Time 0847   PT Stop Time 0930   PT Time Calculation (min) 43 min   Activity Tolerance Patient tolerated treatment well   Behavior During Therapy [facility] for tasks assessed/performed      Past Medical History  Diagnosis Date  . Allergic rhinitis     allergist evaluation spring fall and dust.  . GERD (gastroesophageal reflux disease)   . Ectopic pregnancy     x 2  . Hematuria, microscopic     eval 2001  Dr Terance Hart    Past Surgical History  Procedure Laterality Date  . Laparoscopy      ectopic pregnancy    There were no vitals filed for this visit.      Subjective Assessment - 10/15/15 1241    Subjective I can tell my shoulder is already a little better than it was.  (I was just here yesterday)  I did my exercises.   There was one I has a question about.    Currently in Pain? Yes   Pain Score 1    Pain Location Shoulder            [facility] PT Assessment - 10/14/15 1320    Assessment   Medical Diagnosis L shoulder impingement   Referring Provider Joni Fears MD   Onset Date/Surgical Date --  2 years with recent excacerbation in 6 weeks   Hand Dominance Right   Next MD Visit Make one PRN   Prior Therapy No   Precautions   Precautions None   Restrictions   Weight Bearing Restrictions No   Balance Screen   Has the patient fallen in the past 6 months No   Has the patient had a decrease in activity level because of a fear of falling?  No   Is the patient reluctant to leave their home because of a fear of falling?   No   Home Environment   Living Environment Private residence   Living Arrangements Spouse/significant other   Type of [place] to enter   Entrance Stairs-Number of Steps 3   [place] Two level   Alternate Level Stairs-Number of Steps [place] None   Prior Function   Level of Independence Independent;Independent with basic ADLs   Vocation Retired   Leisure walking, reading, gardening, singing   Cognition   Overall Cognitive Status Within Functional Limits for tasks assessed   Observation/Other Assessments   Focus on Therapeutic Outcomes (FOTO)  31% limitation  predicted 28% limited   Posture/Postural Control   Posture/Postural Control Postural limitations   Postural Limitations Rounded Shoulders;Forward head   ROM / Strength   AROM / PROM / Strength AROM;PROM;Strength   AROM   AROM Assessment Site Shoulder   Right/Left Shoulder Right;Left   Right Shoulder Extension 76 Degrees   Right Shoulder Flexion 156 Degrees   Right Shoulder ABduction 155 Degrees   Right Shoulder Internal Rotation 90 Degrees   Right Shoulder External Rotation 90 Degrees   Left  Shoulder Extension 95 Degrees   Left Shoulder Flexion 156 Degrees   Left Shoulder ABduction 152 Degrees  pain noted at 90 degrees with increased pain with lowering   Left Shoulder Internal Rotation 88 Degrees   Left Shoulder External Rotation 70 Degrees   PROM   Overall PROM  Within functional limits for tasks performed   PROM Assessment Site Shoulder   Right/Left Shoulder Left   Strength   Strength Assessment Site Shoulder;Hand   Right/Left Shoulder Right;Left   Right Shoulder Flexion 4+/5   Right Shoulder Extension 5/5   Right Shoulder ABduction 4+/5   Right Shoulder Internal Rotation 4+/5   Right Shoulder External Rotation 4+/5   Left Shoulder Flexion 4+/5   Left Shoulder Extension 5/5   Left Shoulder ABduction 4+/5  pain during testing   Left Shoulder Internal Rotation 4+/5  pain  during testing   Left Shoulder External Rotation 4+/5  pain during testing   Right Hand Grip (lbs) 46.3  47,44,48   Left Hand Grip (lbs) 44  51,41,40   Palpation   Palpation comment tendnerness at supraspinatus/infraspinatus and along the long head of the biceps tendons with tightness of the L upper trap and levator scapulae.     Special Tests    Special Tests Rotator Cuff Impingement   Rotator Cuff Impingment tests Luan Pulling- Kennedy test;Painful Arc of Motion;Full Can test;Empty Can test;other   Hawkins-Kennedy test   Findings Positive   Side Left   Comments horizontal adduction with pain noted in anterior aspect of shoulder    Empty Can test   Findings Positive   Side Left   Comment pain and weakness compard to open can   Full Can test   Findings Negative   Side Left   Painful Arc of Motion   Findings Positive   Side Left   other   Findings Positive   Side Left   Comments scapular assist                     [facility] Adult PT Treatment/Exercise - 10/15/15 0001    Self-Care   Self-Care --  Shoulder anatomy and posture   Shoulder Exercises: Supine   Other Supine Exercises Serratus anterior with 4 pounds   Other Supine Exercises Supine scapular stabilization series 10 X each with red band HEP   Shoulder Exercises: Seated   Retraction 10 reps   Shoulder Exercises: Sidelying   Other Sidelying Exercises Book opener 5 X each side  More limited Lt shoulder   Shoulder Exercises: Standing   External Rotation 10 reps  towel roll   Theraband Level (Shoulder External Rotation) Level 2 (Red)  cues   Internal Rotation 10 reps  with towel roll, cues   Theraband Level (Shoulder Internal Rotation) Level 2 (Red)   Row 10 reps   Theraband Level (Shoulder Row) Level 2 (Red)  advised to keep elbows by side vs what is shown on ex sheet   Shoulder Exercises: Stretch   Corner Stretch 3 reps;30 seconds  doorway   Other Shoulder Stretches Upper trap stretch,  various head  positions                PT Education - 10/15/15 1302    Education provided Yes   Education Details supine scapular stabilization, doorway srtetch   Person(s) Educated Patient   Methods Explanation;Demonstration   Comprehension Verbalized understanding;Returned demonstration          PT Short Term Goals - 10/14/15 1518  PT SHORT TERM GOAL #1   Title pt will be I with inital HEP (11/04/2015)   Time 3   Period Weeks   Status New   PT SHORT TERM GOAL #2   Title pt will be able to verbalize and demonstrate techniques to reduce L shoulder pain and inflamation via RICE (11/04/2015)   Time 3   Period Weeks   Status New           PT Long Term Goals - 10/14/15 1520    PT LONG TERM GOAL #1   Title pt will be I with all HEP as of last visit ( 11/25/2015)   Time 6   Period Weeks   Status New   PT LONG TERM GOAL #2   Title pt will demonstrate L shoulder AROM with </= 1/10 pain to promote pain free ADLs including but not limited to donning/ doffing clothing (11/25/2015)   Time 6   Period Weeks   Status New   PT LONG TERM GOAL #3   Title pt will improve L shoulder strength to >/= 4+/5 with </= 2/10 pain to promote functional lifting and carrying activities ( 11/25/2015)   Time 6   Period Weeks   Status New   PT LONG TERM GOAL #4   Title pt will be able to liflting/ carry >/= 8 # at shoulder height or higher with </= 2/10 pain  to promote functional overhead  ADLs and assist with pt's personal goal of return to the gym (11/25/2015)   Time 6   Period Weeks   Status New   PT LONG TERM GOAL #5   Title pt will improve FOTO score to >/= 72 to demonsrate improvement in function at discharge (11/25/2015)   Time 6   Period Weeks   Status New               Plan - 10/15/15 1303    Clinical Impression Statement Exercises already helping.   Progress toward home exercise goal.  Minor pain noted intermittantly during session.   Brief only   PT Next Visit Plan continue  scapular stabilization.   book opener, deep neck flexor strengthening.  Consider ball on the wall,  shelf reaching, or prone I, T, Y.     PT Home Exercise Plan supine scapular stabilization.   Consulted and Agree with Plan of Care Patient      Patient will benefit from skilled therapeutic intervention in order to improve the following deficits and impairments:  Pain, Decreased strength, Decreased mobility, Decreased endurance, Decreased activity tolerance, Increased fascial restricitons, Impaired UE functional use, Improper body mechanics, Postural dysfunction, Decreased range of motion, Abnormal gait  Visit Diagnosis: Pain in left shoulder  Cramp and spasm  Abnormal posture     Problem List Patient Active Problem List   Diagnosis Date Noted  . Hematuria 04/23/2011  . Other and unspecified hyperlipidemia 04/23/2011  . Allergic rhinitis   . ABDOMINAL PAIN, RIGHT UPPER QUADRANT, HX OF 11/11/2008  . ALLERGIC RHINITIS 01/26/2007  . GERD 01/26/2007    HARRIS,KAREN 10/15/2015, 1:11 PM  [facility] [address] [place], [place], [address] Phone: [phone]   Fax:  [phone]  Name: Kimberly Schroeder MRN: [id number] Date of Birth: November 27, 1950    Melvenia Needles, PTA 10/15/2015 1:11 PM Phone: [phone] Fax: [phone]

## 2015-10-21 ENCOUNTER — Ambulatory Visit: Payer: BLUE CROSS/BLUE SHIELD | Attending: Orthopaedic Surgery | Admitting: Physical Therapy

## 2015-10-21 DIAGNOSIS — R293 Abnormal posture: Secondary | ICD-10-CM

## 2015-10-21 DIAGNOSIS — R252 Cramp and spasm: Secondary | ICD-10-CM | POA: Diagnosis present

## 2015-10-21 DIAGNOSIS — M25512 Pain in left shoulder: Secondary | ICD-10-CM | POA: Diagnosis present

## 2015-10-21 NOTE — Therapy (Signed)
Kimberly Schroeder, [place], [address] Phone: [phone]   Fax:  [phone]  Physical Therapy Treatment  Patient Details  Name: Kimberly Schroeder MRN: [id number] Date of Birth: 1951-03-29 Referring Provider: Joni Fears MD  Encounter Date: 10/21/2015    Past Medical History  Diagnosis Date  . Allergic rhinitis     allergist evaluation spring fall and dust.  . GERD (gastroesophageal reflux disease)   . Ectopic pregnancy     x 2  . Hematuria, microscopic     eval 2001  Dr Terance Hart    Past Surgical History  Procedure Laterality Date  . Laparoscopy      ectopic pregnancy    There were no vitals filed for this visit.      Subjective Assessment - 10/21/15 1335    Subjective Pain is 0 at rest, but increases with walking the dog in her left shoulder. Pt reporting after doing her exercises her pain decreases. Pt also c/o pain in her left arm when donning and doffing her shirts.    Pain Score 1    Pain Location Shoulder   Pain Orientation Left   Pain Descriptors / Indicators Moaning;Pressure   Pain Frequency Intermittent   Aggravating Factors  walking the dog, driving, dressing Upper body, carrying things, reaching, has difficulty sleeping on Left side   Pain Relieving Factors stop acitvities                         [facility] Adult PT Treatment/Exercise - 10/21/15 1345    Posture/Postural Control   Posture Comments Quadraped: cat/camel stretches x 5 reps, 3 point quadraped position with alternating UE shoulder flexion x 5 reps    Exercises   Exercises Other Exercises   Other Exercises  Pt supine with spinal decompression with Left elbow supported by pillow. Leg Length decompression with LE extension x 5 reps each LE,    Shoulder Exercises: Supine   Theraband Level (Shoulder External Rotation) Level 2 (Red)  bilateral ER with elbows positioned at side at 90 degrees   Theraband Level  (Shoulder Flexion) Level 2 (Red)  arms positioned about 10-12in apart with outward pull   Other Supine Exercises Serratus anterior with 4 pounds x 10 reps   Other Supine Exercises Supine scapular stabilization series 10 X each with red band HEP   Shoulder Exercises: Sidelying   Other Sidelying Exercises Book opener 5 X each side  More limited Lt shoulder   Shoulder Exercises: Standing   External Rotation 10 reps;Theraband   Theraband Level (Shoulder External Rotation) Level 3 (Green)   Internal Rotation 10 reps;Theraband   Theraband Level (Shoulder Internal Rotation) Level 3 (Green)   Extension 10 reps   Theraband Level (Shoulder Extension) Level 3 (Green)   Theraband Level (Shoulder Row) Level 3 (Green)   Row Limitations verbal instructions to prevent shoulder hiking on the left side   Shoulder Exercises: IT sales professional --  Doorway stretch 30 seconds                  PT Short Term Goals - 10/14/15 1518    PT SHORT TERM GOAL #1   Title pt will be I with inital HEP (11/04/2015)   Time 3   Period Weeks   Status New   PT SHORT TERM GOAL #2   Title pt will be able to verbalize and demonstrate techniques to reduce L shoulder pain and inflamation  via RICE (11/04/2015)   Time 3   Period Weeks   Status New           PT Long Term Goals - 10/14/15 1520    PT LONG TERM GOAL #1   Title pt will be I with all HEP as of last visit ( 11/25/2015)   Time 6   Period Weeks   Status New   PT LONG TERM GOAL #2   Title pt will demonstrate L shoulder AROM with </= 1/10 pain to promote pain free ADLs including but not limited to donning/ doffing clothing (11/25/2015)   Time 6   Period Weeks   Status New   PT LONG TERM GOAL #3   Title pt will improve L shoulder strength to >/= 4+/5 with </= 2/10 pain to promote functional lifting and carrying activities ( 11/25/2015)   Time 6   Period Weeks   Status New   PT LONG TERM GOAL #4   Title pt will be able to liflting/ carry >/=  8 # at shoulder height or higher with </= 2/10 pain  to promote functional overhead  ADLs and assist with pt's personal goal of return to the gym (11/25/2015)   Time 6   Period Weeks   Status New   PT LONG TERM GOAL #5   Title pt will improve FOTO score to >/= 72 to demonsrate improvement in function at discharge (11/25/2015)   Time 6   Period Weeks   Status New               Plan - 10/21/15 1433    Clinical Impression Statement Pt has made improvements since beginning therapy reporting decrased pain which occurs during ADL's. Pt still reporting pain with reaching and mild pain during session today 1-2/10 in left shoulder.    Rehab Potential Good   PT Next Visit Plan Continue to progress scapular stabilization, review book opener stretch in sidelying, begin ball stabilization exercises on the wall and consider prone I, Y, and T' exercises   PT Home Exercise Plan We added standing IR and ER with the green theraband and handout provided.   Consulted and Agree with Plan of Care Patient      Patient will benefit from skilled therapeutic intervention in order to improve the following deficits and impairments:  Decreased strength, Decreased endurance, Decreased activity tolerance, Increased fascial restricitons, Pain, Decreased range of motion, Impaired UE functional use, Decreased mobility, Postural dysfunction  Visit Diagnosis: Pain in left shoulder  Cramp and spasm  Abnormal posture     Problem List Patient Active Problem List   Diagnosis Date Noted  . Hematuria 04/23/2011  . Other and unspecified hyperlipidemia 04/23/2011  . Allergic rhinitis   . ABDOMINAL PAIN, RIGHT UPPER QUADRANT, HX OF 11/11/2008  . ALLERGIC RHINITIS 01/26/2007  . GERD 01/26/2007    Kimberly Schroeder 10/21/2015, 2:47 PM  [facility] [address] [place], [place], [address] Phone: [phone]   Fax:  [phone]  Name: Kimberly Schroeder MRN:  [id number] Date of Birth: 1950-06-06   Documentation and treatment assisted by: Kearney Hard, PT 10/21/2015 2:47 PM   Treatment provided by Melvenia Needles, PTA 10/21/15

## 2015-10-21 NOTE — Patient Instructions (Signed)
Internal Rotation (Eccentric), (Resistance Band)    Hold band with hand of affected arm. Keep wrist neutral. Slowly return for 3-5 seconds. Use ___greenExternal Rotation (Eccentric), (Resistance Band)    Slowly pull band outward with forearm of affected arm. Keep wrists neutral. Slowly return to start for 3-5 seconds. Use ___green_____ resistance band. Hint: Use towel roll between elbow and hip. ___10  reps per set, 1-2___ sets per day, _1_ days per week.  http://ecce.exer.us/197   Copyright  VHI. All rights reserved.  _____ resistance band. Hint: Use towel roll between elbow and waist or elbow and hip. _10__ reps per set, _1-2__ sets per day, __1_ days per week.  http://ecce.exer.us/211   Copyright  VHI. All rights reserved.

## 2015-10-23 ENCOUNTER — Ambulatory Visit: Payer: BLUE CROSS/BLUE SHIELD | Admitting: Physical Therapy

## 2015-10-23 DIAGNOSIS — M25512 Pain in left shoulder: Secondary | ICD-10-CM

## 2015-10-23 DIAGNOSIS — R252 Cramp and spasm: Secondary | ICD-10-CM

## 2015-10-23 DIAGNOSIS — R293 Abnormal posture: Secondary | ICD-10-CM

## 2015-10-23 NOTE — Therapy (Signed)
[place], [place], [address] Phone: [phone]   Fax:  [phone]  Physical Therapy Treatment  Patient Details  Name: Kimberly Schroeder MRN: [id number] Date of Birth: [birth year] Referring Provider: Joni Fears MD  Encounter Date: 10/23/2015      PT End of Session - 10/23/15 1424    Visit Number 3   Number of Visits 13   Date for PT Re-Evaluation 11/25/15   Authorization Type BCBS   PT Start Time 1330   PT Stop Time 1416   PT Time Calculation (min) 46 min   Activity Tolerance Patient tolerated treatment well   Behavior During Therapy [facility] for tasks assessed/performed      Past Medical History  Diagnosis Date  . Allergic rhinitis     allergist evaluation spring fall and dust.  . GERD (gastroesophageal reflux disease)   . Ectopic pregnancy     x 2  . Hematuria, microscopic     eval 2001  Dr Terance Hart    Past Surgical History  Procedure Laterality Date  . Laparoscopy      ectopic pregnancy    There were no vitals filed for this visit.      Subjective Assessment - 10/23/15 1330    Subjective (p) "doing better but still have some pain with driveing and other activities but am able to do more without as much issue"   Currently in Pain? (p) Yes   Pain Score (p) 1    Pain Location (p) Shoulder   Pain Orientation (p) Left   Pain Descriptors / Indicators (p) Sore   Pain Type (p) Chronic pain   Pain Onset (p) More than a month ago   Pain Frequency (p) Intermittent                         [facility] Adult PT Treatment/Exercise - 10/23/15 0001    Shoulder Exercises: Prone   Other Prone Exercises I's T's and Y's 2 x 10 each direction  1#   Shoulder Exercises: ROM/Strengthening   UBE (Upper Arm Bike) L 1.5 x 4 min  changing direction at 2 min   Shoulder Exercises: Stretch   Other Shoulder Stretches 2 x 30 sec   following UBE    Other Shoulder Stretches Rhomboid stretching 2  x 30 sec    Manual Therapy   Manual Therapy Joint mobilization;Scapular mobilization   Manual therapy comments manual trigger point release ax 3 along L upper trap, grade 3 T1-T7 P>A mobs   Joint Mobilization inferior/posterior distal claviclular mobs grade 2   Scapular Mobilization Grade 3 mobs in all direction with focus on upward rotation to facilitate scapulohumeral rhythm                PT Education - 10/23/15 1423    Education provided Yes   Education Details posture keeping the shoulders down and back, with reaching/lifting/ carrying activities or any UE use to work on keeping shoulders down to avoid upper trap over activation, avoiding hiking   Person(s) Educated Patient   Methods Explanation;Demonstration;Verbal cues   Comprehension Verbalized understanding;Returned demonstration          PT Short Term Goals - 10/23/15 1429    PT SHORT TERM GOAL #1   Title pt will be I with inital HEP (11/04/2015)   Time 3   Period Weeks   Status Achieved   PT SHORT TERM GOAL #2   Title pt  will be able to verbalize and demonstrate techniques to reduce L shoulder pain and inflamation via RICE (11/04/2015)   Time 3   Period Weeks   Status On-going           PT Long Term Goals - 10/14/15 1520    PT LONG TERM GOAL #1   Title pt will be I with all HEP as of last visit ( 11/25/2015)   Time 6   Period Weeks   Status New   PT LONG TERM GOAL #2   Title pt will demonstrate L shoulder AROM with </= 1/10 pain to promote pain free ADLs including but not limited to donning/ doffing clothing (11/25/2015)   Time 6   Period Weeks   Status New   PT LONG TERM GOAL #3   Title pt will improve L shoulder strength to >/= 4+/5 with </= 2/10 pain to promote functional lifting and carrying activities ( 11/25/2015)   Time 6   Period Weeks   Status New   PT LONG TERM GOAL #4   Title pt will be able to liflting/ carry >/= 8 # at shoulder height or higher with </= 2/10 pain  to promote functional  overhead  ADLs and assist with pt's personal goal of return to the gym (11/25/2015)   Time 6   Period Weeks   Status New   PT LONG TERM GOAL #5   Title pt will improve FOTO score to >/= 72 to demonsrate improvement in function at discharge (11/25/2015)   Time 6   Period Weeks   Status New               Plan - 10/23/15 1424    Clinical Impression Statement [name] reported she is doing better and is making progress with therpay. Performed mobs on scapular to promote scapulohumeral rhythm which decreased clicking and popping, which upon assessment appeard to be at A/C joint so worked also on distal clavicle mobs which decreased popping/ clicking to intermittentt pain free. pt complete exercises given without increased pain and denied modalites post session.  progressing with goals well meeting STG #1 today.    PT Next Visit Plan Continue to progress to standing scapular stabilization as tolerated , thoracic mobility exercises, manual of scapula/ clavicle, give I's T's and Y's as HEP   Consulted and Agree with Plan of Care Patient      Patient will benefit from skilled therapeutic intervention in order to improve the following deficits and impairments:  Decreased strength, Decreased endurance, Decreased activity tolerance, Increased fascial restricitons, Pain, Decreased range of motion, Impaired UE functional use, Decreased mobility, Postural dysfunction  Visit Diagnosis: Pain in left shoulder  Cramp and spasm  Abnormal posture     Problem List Patient Active Problem List   Diagnosis Date Noted  . Hematuria 04/23/2011  . Other and unspecified hyperlipidemia 04/23/2011  . Allergic rhinitis   . ABDOMINAL PAIN, RIGHT UPPER QUADRANT, HX OF 11/11/2008  . ALLERGIC RHINITIS 01/26/2007  . GERD 01/26/2007   Starr Lake PT, DPT, LAT, ATC  10/23/2015  2:30 PM      [facility] [address] [place], [place],  [address] Phone: [phone]   Fax:  [phone]  Name: Kimberly Schroeder MRN: [id number] Date of Birth: 1950/08/13

## 2015-10-27 ENCOUNTER — Ambulatory Visit: Payer: BLUE CROSS/BLUE SHIELD | Admitting: Physical Therapy

## 2015-10-27 DIAGNOSIS — R252 Cramp and spasm: Secondary | ICD-10-CM

## 2015-10-27 DIAGNOSIS — R293 Abnormal posture: Secondary | ICD-10-CM

## 2015-10-27 DIAGNOSIS — M25512 Pain in left shoulder: Secondary | ICD-10-CM | POA: Diagnosis not present

## 2015-10-27 NOTE — Therapy (Signed)
[place], [place], [address] Phone: [phone]   Fax:  [phone]  Physical Therapy Treatment  Patient Details  Name: Kimberly Schroeder MRN: [id number] Date of Birth: 1951-02-12 Referring Provider: Joni Fears MD  Encounter Date: 10/27/2015      PT End of Session - 10/27/15 1155    Visit Number 4   Number of Visits 13   Date for PT Re-Evaluation 11/25/15   Authorization Type BCBS   PT Start Time 1147   PT Stop Time 1228   PT Time Calculation (min) 41 min      Past Medical History  Diagnosis Date  . Allergic rhinitis     allergist evaluation spring fall and dust.  . GERD (gastroesophageal reflux disease)   . Ectopic pregnancy     x 2  . Hematuria, microscopic     eval 2001  Dr Terance Hart    Past Surgical History  Procedure Laterality Date  . Laparoscopy      ectopic pregnancy    There were no vitals filed for this visit.      Subjective Assessment - 10/27/15 1153    Subjective Sore after the manual treatment last visit.    Currently in Pain? Yes   Pain Score 1    Pain Location Shoulder   Pain Orientation Left;Posterior   Pain Frequency Constant                         [facility] Adult PT Treatment/Exercise - 10/27/15 0001    Shoulder Exercises: Supine   Other Supine Exercises Supine scapular stabilization series 10 X 2 each with green band except sash (use red)   Shoulder Exercises: Prone   Other Prone Exercises I's T's and Y's 2 x 10 each direction  1# left only   Shoulder Exercises: Standing   External Rotation 20 reps;Both   Theraband Level (Shoulder External Rotation) Level 3 (Green)   Internal Rotation 20 reps   Theraband Level (Shoulder Internal Rotation) Level 3 (Green)   Extension 20 reps   Theraband Level (Shoulder Extension) Level 3 (Green)   Row 20 reps   Theraband Level (Shoulder Row) Level 3 (Green)   Shoulder Exercises: ROM/Strengthening   UBE  (Upper Arm Bike) L 1.5 x 4 min  changing direction at 2 min   Shoulder Exercises: Stretch   Corner Stretch 3 reps;30 seconds  doorway   Other Shoulder Stretches Upper trap stretch,  various head positions  bilateral                PT Education - 10/27/15 1222    Education provided Yes   Education Details  left I, T, Y   Person(s) Educated Patient   Methods Explanation;Handout   Comprehension Verbalized understanding;Returned demonstration          PT Short Term Goals - 10/27/15 1219    PT SHORT TERM GOAL #1   Title pt will be I with inital HEP (11/04/2015)   Time 3   Period Weeks   Status Achieved   PT SHORT TERM GOAL #2   Title pt will be able to verbalize and demonstrate techniques to reduce L shoulder pain and inflamation via RICE (11/04/2015)   Time 3   Period Weeks   Status Achieved           PT Long Term Goals - 10/27/15 1216    PT LONG TERM GOAL #1   Title  pt will be I with all HEP as of last visit ( 11/25/2015)   Time 6   Period Weeks   Status On-going   PT LONG TERM GOAL #2   Title pt will demonstrate L shoulder AROM with </= 1/10 pain to promote pain free ADLs including but not limited to donning/ doffing clothing (11/25/2015)   Baseline Intermittent pain with don/doffing shoulders improved from constant   Time 6   Period Weeks   Status Partially Met   PT LONG TERM GOAL #3   Title pt will improve L shoulder strength to >/= 4+/5 with </= 2/10 pain to promote functional lifting and carrying activities ( 11/25/2015)   Time 6   Period Weeks   Status Unable to assess   PT LONG TERM GOAL #4   Title pt will be able to liflting/ carry >/= 8 # at shoulder height or higher with </= 2/10 pain  to promote functional overhead  ADLs and assist with pt's personal goal of return to the gym (11/25/2015)   Time 6   Period Weeks   Status Unable to assess   PT LONG TERM GOAL #5   Title pt will improve FOTO score to >/= 72 to demonsrate improvement in function at  discharge (11/25/2015)   Time 6   Period Weeks   Status On-going               Plan - 10/27/15 1217    Clinical Impression Statement Pt reports constant soreness lately and it was increased after last manual treatment. Better today. She has noted improvement in pain with don/doffing clothes to intermittent. She was also able to lift a stack of dishes into the cabinet without increased pain. All STGs MET, LTG#2 Partially Met.    PT Next Visit Plan Continue to progress to standing scapular stabilization as tolerated , thoracic mobility exercises, manual of scapula/ clavicle, give I's T's and Y's as HEP   PT Home Exercise Plan prone ITY with left only, use green bands for all HEP except supine sash      Patient will benefit from skilled therapeutic intervention in order to improve the following deficits and impairments:  Decreased strength, Decreased endurance, Decreased activity tolerance, Increased fascial restricitons, Pain, Decreased range of motion, Impaired UE functional use, Decreased mobility, Postural dysfunction  Visit Diagnosis: Pain in left shoulder  Cramp and spasm  Abnormal posture     Problem List Patient Active Problem List   Diagnosis Date Noted  . Hematuria 04/23/2011  . Other and unspecified hyperlipidemia 04/23/2011  . Allergic rhinitis   . ABDOMINAL PAIN, RIGHT UPPER QUADRANT, HX OF 11/11/2008  . ALLERGIC RHINITIS 01/26/2007  . GERD 01/26/2007    Dorene Ar, PTA 10/27/2015, 12:37 PM  [facility] [address] [place], [place], [address] Phone: [phone]   Fax:  [phone]  Name: ZORAYA FIORENZA MRN: [id number] Date of Birth: [birth year]

## 2015-10-29 ENCOUNTER — Ambulatory Visit: Payer: BLUE CROSS/BLUE SHIELD | Admitting: Physical Therapy

## 2015-10-29 DIAGNOSIS — M25512 Pain in left shoulder: Secondary | ICD-10-CM

## 2015-10-29 DIAGNOSIS — R252 Cramp and spasm: Secondary | ICD-10-CM

## 2015-10-29 DIAGNOSIS — R293 Abnormal posture: Secondary | ICD-10-CM

## 2015-10-29 NOTE — Therapy (Signed)
[place], [place], [address] Phone: [phone]   Fax:  [phone]  Physical Therapy Treatment  Patient Details  Name: Kimberly Schroeder MRN: [id number] Date of Birth: 1950-05-25 Referring Provider: Joni Fears MD  Encounter Date: 10/29/2015      PT End of Session - 10/29/15 1155    Visit Number 5   Number of Visits 13   Date for PT Re-Evaluation 11/25/15   Authorization Type BCBS   PT Start Time 9767   PT Stop Time 1227   PT Time Calculation (min) 42 min      Past Medical History  Diagnosis Date  . Allergic rhinitis     allergist evaluation spring fall and dust.  . GERD (gastroesophageal reflux disease)   . Ectopic pregnancy     x 2  . Hematuria, microscopic     eval 2001  Dr Terance Hart    Past Surgical History  Procedure Laterality Date  . Laparoscopy      ectopic pregnancy    There were no vitals filed for this visit.      Subjective Assessment - 10/29/15 1155    Currently in Pain? No/denies            [facility] PT Assessment - 10/29/15 0001    Strength   Right Shoulder Flexion 4+/5   Right Shoulder Extension 5/5   Right Shoulder ABduction 4+/5   Right Shoulder Internal Rotation 4+/5   Right Shoulder External Rotation 4+/5   Left Shoulder Flexion 4+/5  no pain   Left Shoulder Extension 5/5   Left Shoulder ABduction 4+/5  < 1/10 pain   Left Shoulder Internal Rotation 4+/5  no pain   Left Shoulder External Rotation 4+/5  no pain                     [facility] Adult PT Treatment/Exercise - 10/29/15 0001    Shoulder Exercises: Prone   Other Prone Exercises I's T's and Y's 3 x 10 each direction  1# left only   Shoulder Exercises: Standing   External Rotation 20 reps;Both   Theraband Level (Shoulder External Rotation) Level 3 (Green)   Internal Rotation 20 reps   Theraband Level (Shoulder Internal Rotation) Level 3 (Green)   ABduction Strengthening;Left;10  reps;Weights   Shoulder ABduction Weight (lbs) 1  3 sets   Extension 20 reps   Theraband Level (Shoulder Extension) Level 3 (Green)   Row 20 reps   Theraband Level (Shoulder Row) Level 3 (Green)   Other Standing Exercises cabinet reaching 2#, 3# overhead on shelf 2 x 10 each , also carrying 8# at shoulder height using BUE and no increaseed pain, then plaing 8# with BUE onto bottom shelf at shoulder height x 10   Shoulder Exercises: ROM/Strengthening   UBE (Upper Arm Bike) L 2 x 4 min  changing direction at 2 min   Shoulder Exercises: Stretch   Corner Stretch 3 reps;30 seconds  doorway                PT Education - 10/29/15 1235    Education provided Yes   Education Details standing lateral raise with soup can 3 sets of 10   Person(s) Educated Patient   Methods Explanation   Comprehension Verbalized understanding          PT Short Term Goals - 10/27/15 1219    PT SHORT TERM GOAL #1   Title pt will be I with  inital HEP (11/04/2015)   Time 3   Period Weeks   Status Achieved   PT SHORT TERM GOAL #2   Title pt will be able to verbalize and demonstrate techniques to reduce L shoulder pain and inflamation via RICE (11/04/2015)   Time 3   Period Weeks   Status Achieved           PT Long Term Goals - 10/29/15 1214    PT LONG TERM GOAL #1   Title pt will be I with all HEP as of last visit ( 11/25/2015)   Time 6   Period Weeks   Status On-going   PT LONG TERM GOAL #2   Title pt will demonstrate L shoulder AROM with </= 1/10 pain to promote pain free ADLs including but not limited to donning/ doffing clothing (11/25/2015)   Baseline no pain last 2 days   Time 6   Period Weeks   Status Partially Met   PT LONG TERM GOAL #3   Title pt will improve L shoulder strength to >/= 4+/5 with </= 2/10 pain to promote functional lifting and carrying activities ( 11/25/2015)   Time 6   Period Weeks   Status Achieved   PT LONG TERM GOAL #4   Title pt will be able to liflting/  carry >/= 8 # at shoulder height or higher with </= 2/10 pain  to promote functional overhead  ADLs and assist with pt's personal goal of return to the gym (11/25/2015)   Time 6   Period Weeks   Status Achieved   PT LONG TERM GOAL #5   Title pt will improve FOTO score to >/= 72 to demonsrate improvement in function at discharge (11/25/2015)   Time 6   Period Weeks   Status Unable to assess               Plan - 10/29/15 1227    Clinical Impression Statement Pt reports no pain with donn/doffing clothing over the last 2 days. She has also noticed no pain with using her LUE for driving today. Pain with MMT 0/10 today except abduction which she rated at less than 1/10. Also began cabinet reaching with up to 3# LUE and lifting and carrying up to 8# at shoulder height. Simulated placing stack of dishes in cabinet with 8# bilateral UEs 10 reps and no increased pain. Will assess response to todays treatment next week and likely DC to HEP.    PT Next Visit Plan REVIEW HEP and discharge if no increased pain.    PT Home Exercise Plan prone ITY with left only, use green bands for all HEP, added lateral raise with soup can 3 x 10.       Patient will benefit from skilled therapeutic intervention in order to improve the following deficits and impairments:  Decreased strength, Decreased endurance, Decreased activity tolerance, Increased fascial restricitons, Pain, Decreased range of motion, Impaired UE functional use, Decreased mobility, Postural dysfunction  Visit Diagnosis: Pain in left shoulder  Cramp and spasm  Abnormal posture     Problem List Patient Active Problem List   Diagnosis Date Noted  . Hematuria 04/23/2011  . Other and unspecified hyperlipidemia 04/23/2011  . Allergic rhinitis   . ABDOMINAL PAIN, RIGHT UPPER QUADRANT, HX OF 11/11/2008  . ALLERGIC RHINITIS 01/26/2007  . GERD 01/26/2007    Kimberly Schroeder, PTA 10/29/2015, 12:36 PM  [facility] [address] [place], [place], [address] Phone: [phone]  Fax:  [phone]  Name: Kimberly Schroeder MRN: [id number] Date of Birth: 12-Dec-1950

## 2015-11-03 ENCOUNTER — Ambulatory Visit: Payer: BLUE CROSS/BLUE SHIELD | Admitting: Physical Therapy

## 2015-11-03 DIAGNOSIS — R293 Abnormal posture: Secondary | ICD-10-CM

## 2015-11-03 DIAGNOSIS — M25512 Pain in left shoulder: Secondary | ICD-10-CM | POA: Diagnosis not present

## 2015-11-03 DIAGNOSIS — R252 Cramp and spasm: Secondary | ICD-10-CM

## 2015-11-04 NOTE — Therapy (Addendum)
[place], [place], [address] Phone: [phone]   Fax:  [phone]  Physical Therapy Treatment / Discharge Note  Patient Details  Name: Kimberly Schroeder MRN: [id number] Date of Birth: 04-05-1951 Referring Provider: Joni Fears MD  Encounter Date: 11/03/2015      PT End of Session - 11/03/15 1059    Visit Number 6   Number of Visits 13   Date for PT Re-Evaluation 11/25/15   Authorization Type BC[employer]   PT Start Time 1058   PT Stop Time 1136   PT Time Calculation (min) 38 min      Past Medical History  Diagnosis Date  . Allergic rhinitis     allergist evaluation spring fall and dust.  . GERD (gastroesophageal reflux disease)   . Ectopic pregnancy     x 2  . Hematuria, microscopic     eval 2001  Dr Terance Hart    Past Surgical History  Procedure Laterality Date  . Laparoscopy      ectopic pregnancy    There were no vitals filed for this visit.      Subjective Assessment - 11/03/15 1059    Currently in Pain? No/denies            [facility] PT Assessment - 11/04/15 0001    Observation/Other Assessments   Focus on Therapeutic Outcomes (FOTO)  19% limited improved from 31% limited   Strength   Right Shoulder Flexion 4+/5   Right Shoulder Extension 5/5   Right Shoulder ABduction 4+/5   Right Shoulder Internal Rotation 4+/5   Right Shoulder External Rotation 4+/5   Left Shoulder Flexion 4+/5  no pain   Left Shoulder Extension 5/5   Left Shoulder ABduction 4+/5  < 1/10 pain   Left Shoulder Internal Rotation 4+/5  no pain   Left Shoulder External Rotation 4+/5  no pain                     [facility] Adult PT Treatment/Exercise - 11/04/15 0001    Shoulder Exercises: Supine   Protraction Left;20 reps   Other Supine Exercises Supine scapular stabilization series 10 X 2 each with green band except sash (use red)   Shoulder Exercises: Prone   Other Prone Exercises I's T's and  Y's 3 x 10 each direction  1# left only   Shoulder Exercises: Standing   External Rotation 20 reps;Both   Theraband Level (Shoulder External Rotation) Level 3 (Green)   Internal Rotation 20 reps   Theraband Level (Shoulder Internal Rotation) Level 3 (Green)   ABduction Strengthening;Left;10 reps;Weights   Shoulder ABduction Weight (lbs) 1  3 sets   Extension 20 reps   Theraband Level (Shoulder Extension) Level 3 (Green)   Row 20 reps   Theraband Level (Shoulder Row) Level 3 (Green)   Other Standing Exercises cabinet reaching 2#, 3# overhead on shelf 2 x 10 each , also carrying 8# at shoulder height using BUE and no increaseed pain, then placing 8# with BUE onto bottom shelf at shoulder height x 5, then middle shelf x 10   Shoulder Exercises: ROM/Strengthening   UBE (Upper Arm Bike) L 2 x 4 min  changing direction at 2 min   Shoulder Exercises: Stretch   Corner Stretch 3 reps;30 seconds  doorway                  PT Short Term Goals - 10/27/15 1219    PT SHORT TERM  GOAL #1   Title pt will be I with inital HEP (11/04/2015)   Time 3   Period Weeks   Status Achieved   PT SHORT TERM GOAL #2   Title pt will be able to verbalize and demonstrate techniques to reduce L shoulder pain and inflamation via RICE (11/04/2015)   Time 3   Period Weeks   Status Achieved           PT Long Term Goals - 11/03/15 0840    PT LONG TERM GOAL #1   Title pt will be I with all HEP as of last visit ( 11/25/2015)   Time 6   Period Weeks   Status Achieved   PT LONG TERM GOAL #2   Title pt will demonstrate L shoulder AROM with </= 1/10 pain to promote pain free ADLs including but not limited to donning/ doffing clothing (11/25/2015)   Time 6   Period Weeks   Status Achieved   PT LONG TERM GOAL #3   Title pt will improve L shoulder strength to >/= 4+/5 with </= 2/10 pain to promote functional lifting and carrying activities ( 11/25/2015)   Time 6   Period Weeks   Status Achieved   PT LONG  TERM GOAL #4   Title pt will be able to liflting/ carry >/= 8 # at shoulder height or higher with </= 2/10 pain  to promote functional overhead  ADLs and assist with pt's personal goal of return to the gym (11/25/2015)   Time 6   Period Weeks   Status Achieved   PT LONG TERM GOAL #5   Title pt will improve FOTO score to >/= 72 to demonsrate improvement in function at discharge (11/25/2015)   Time 6   Period Weeks   Status Achieved               Plan - 11/03/15 0843    Clinical Impression Statement Kimberly Schroeder reports no pain with ADLs including driving, sleeping, placing stacks of plates in cabinet. She has no pain with MMT and her strength is grossly 4+/5 to 5/5 bilateral shoulders. She is independent with her HEP and feels ready for discharge. FOTO sore improved to 19% limited.    PT Next Visit Plan discharge today   PT Home Exercise Plan prone ITY with left only, use green bands for all HEP, added lateral raise with soup can 3 x 10.       Patient will benefit from skilled therapeutic intervention in order to improve the following deficits and impairments:  Decreased strength, Decreased endurance, Decreased activity tolerance, Increased fascial restricitons, Pain, Decreased range of motion, Impaired UE functional use, Decreased mobility, Postural dysfunction  Visit Diagnosis: Pain in left shoulder  Cramp and spasm  Abnormal posture     Problem List Patient Active Problem List   Diagnosis Date Noted  . Hematuria 04/23/2011  . Other and unspecified hyperlipidemia 04/23/2011  . Allergic rhinitis   . ABDOMINAL PAIN, RIGHT UPPER QUADRANT, HX OF 11/11/2008  . ALLERGIC RHINITIS 01/26/2007  . GERD 01/26/2007    Dorene Ar, PTA 11/04/2015, 8:47 AM  [facility] [address] [place], [place], [address] Phone: [phone]   Fax:  [phone]  Name: Kimberly Schroeder MRN: [id number] Date of Birth:  31-Jul-1950   PHYSICAL THERAPY DISCHARGE SUMMARY  Visits from Start of Care: 6  Current functional level related to goals / functional outcomes: FOTO 19% limited   Remaining deficits: No  limitations with pain mobility. Patient did well with therapy!  Education / Equipment: HEP, theraband for strengthening, posture education  Plan: Patient agrees to discharge.  Patient goals were met. Patient is being discharged due to meeting the stated rehab goals.  ?????        Kristoffer Leamon PT, DPT, LAT, ATC  11/04/2015  1:31 PM

## 2015-11-05 ENCOUNTER — Encounter: Payer: BLUE CROSS/BLUE SHIELD | Admitting: Physical Therapy

## 2016-02-23 ENCOUNTER — Ambulatory Visit (INDEPENDENT_AMBULATORY_CARE_PROVIDER_SITE_OTHER): Payer: BLUE CROSS/BLUE SHIELD | Admitting: Family Medicine

## 2016-02-23 DIAGNOSIS — Z23 Encounter for immunization: Secondary | ICD-10-CM

## 2016-03-19 ENCOUNTER — Encounter: Payer: Self-pay | Admitting: Gastroenterology

## 2016-03-22 ENCOUNTER — Other Ambulatory Visit (INDEPENDENT_AMBULATORY_CARE_PROVIDER_SITE_OTHER): Payer: BLUE CROSS/BLUE SHIELD

## 2016-03-22 ENCOUNTER — Other Ambulatory Visit: Payer: BLUE CROSS/BLUE SHIELD | Admitting: Internal Medicine

## 2016-03-22 DIAGNOSIS — Z0000 Encounter for general adult medical examination without abnormal findings: Secondary | ICD-10-CM | POA: Diagnosis not present

## 2016-03-22 LAB — CBC WITH DIFFERENTIAL/PLATELET
BASOS PCT: 0.8 % (ref 0.0–3.0)
Basophils Absolute: 0 10*3/uL (ref 0.0–0.1)
EOS PCT: 3.1 % (ref 0.0–5.0)
Eosinophils Absolute: 0.1 10*3/uL (ref 0.0–0.7)
HCT: 40.5 % (ref 36.0–46.0)
Hemoglobin: 13.8 g/dL (ref 12.0–15.0)
LYMPHS ABS: 1.7 10*3/uL (ref 0.7–4.0)
Lymphocytes Relative: 38.2 % (ref 12.0–46.0)
MCHC: 34.1 g/dL (ref 30.0–36.0)
MCV: 87.4 fl (ref 78.0–100.0)
MONOS PCT: 6.5 % (ref 3.0–12.0)
Monocytes Absolute: 0.3 10*3/uL (ref 0.1–1.0)
NEUTROS PCT: 51.4 % (ref 43.0–77.0)
Neutro Abs: 2.3 10*3/uL (ref 1.4–7.7)
Platelets: 345 10*3/uL (ref 150.0–400.0)
RBC: 4.63 Mil/uL (ref 3.87–5.11)
RDW: 12.6 % (ref 11.5–15.5)
WBC: 4.5 10*3/uL (ref 4.0–10.5)

## 2016-03-22 LAB — HEPATIC FUNCTION PANEL
ALBUMIN: 4 g/dL (ref 3.5–5.2)
ALT: 24 U/L (ref 0–35)
AST: 20 U/L (ref 0–37)
Alkaline Phosphatase: 52 U/L (ref 39–117)
Bilirubin, Direct: 0 mg/dL (ref 0.0–0.3)
Total Bilirubin: 0.4 mg/dL (ref 0.2–1.2)
Total Protein: 6.7 g/dL (ref 6.0–8.3)

## 2016-03-22 LAB — BASIC METABOLIC PANEL
BUN: 15 mg/dL (ref 6–23)
CHLORIDE: 110 meq/L (ref 96–112)
CO2: 28 meq/L (ref 19–32)
Calcium: 9.5 mg/dL (ref 8.4–10.5)
Creatinine, Ser: 0.79 mg/dL (ref 0.40–1.20)
GFR: 78.03 mL/min (ref >60.00)
GLUCOSE: 97 mg/dL (ref 70–99)
POTASSIUM: 4.1 meq/L (ref 3.5–5.1)
SODIUM: 143 meq/L (ref 135–145)

## 2016-03-22 LAB — LIPID PANEL
CHOLESTEROL: 172 mg/dL (ref 0–200)
HDL: 60.7 mg/dL (ref >39.00)
LDL Cholesterol: 99 mg/dL (ref 0–99)
NonHDL: 110.82
Total CHOL/HDL Ratio: 3
Triglycerides: 61 mg/dL (ref 0.0–149.0)
VLDL: 12.2 mg/dL (ref 0.0–40.0)

## 2016-03-22 LAB — TSH: TSH: 1.94 u[IU]/mL (ref 0.35–4.50)

## 2016-03-26 NOTE — Progress Notes (Signed)
Pre visit review using our clinic review tool, if applicable. No additional management support is needed unless otherwise documented below in the visit note.  Chief Complaint  Patient presents with  . Annual Exam    HPI: Patient  Kimberly Schroeder  65 y.o. comes in today for Preventive Health Care visit  She is under care for allergies with allergy injections medication has had a left shoulder rotator cuff difficulty has seen Dr. orthopedist in the past doing exercises as opposed to surgery or injections.  Health Maintenance  Topic Date Due  . TETANUS/TDAP  04/28/2016  . MAMMOGRAM  07/15/2016  . PAP SMEAR  07/15/2017  . COLONOSCOPY  04/20/2021  . INFLUENZA VACCINE  Addressed  . ZOSTAVAX  Completed   Health Maintenance Review LIFESTYLE:  Exercise:  Walks q d  Shoulder   Limitation RC  Tobacco/ETS:no Alcohol:  no Sugar beverages:no Sleep:7-8 Drug use: no HH of 2  Spouse    Dog  Work:no outside home  Husband has a chronic GI issue Crohn's ileitis with a pouchitis recurrent. Mother-in-law 80 recently placed in assisted living. sharma  Grewal.     ROS:  GEN/ HEENT: No fever, significant weight changes sweats headaches vision problems hearing changes, CV/ PULM; No chest pain shortness of breath cough, syncope,edema  change in exercise tolerance. GI /GU: No adominal pain, vomiting, change in bowel habits. No blood in the stool. No significant GU symptoms. SKIN/HEME: ,no acute skin rashes suspicious lesions or bleeding. No lymphadenopathy, nodules, masses.  NEURO/ PSYCH:  No neurologic signs such as weakness numbness. No depression anxiety. IMM/ Allergy: No unusual infections.  Allergy .   REST of 12 system review negative except as per HPI   Past Medical History:  Diagnosis Date  . Allergic rhinitis    allergist evaluation spring fall and dust.  . Ectopic pregnancy    x 2  . GERD (gastroesophageal reflux disease)   . Hematuria, microscopic    eval 2001  Dr Terance Hart     Past Surgical History:  Procedure Laterality Date  . LAPAROSCOPY     ectopic pregnancy    Family History  Problem Relation Age of Onset  . Parkinsonism    . Arthritis    . Colon cancer    . Hypertension    . Colon cancer Father   . Colon cancer Paternal Uncle     Social History   Social History  . Marital status: Married    Spouse name: N/A  . Number of children: N/A  . Years of education: N/A   Social History Main Topics  . Smoking status: Never Smoker  . Smokeless tobacco: Never Used  . Alcohol use Yes  . Drug use: Unknown  . Sexual activity: Not Asked   Other Topics Concern  . None   Social History Narrative   Married   HH of 2 and rescue dog   Exercises   G2   Husband recovering from severe myocardiopathy viral and chf getting better will not need a heart transplant   Has had ileitis with  pouch   Husband  Retired    No ets.    [employer]  Family elederly.     Outpatient Medications Prior to Visit  Medication Sig Dispense Refill  . Calcium-Vitamin D-Vitamin K (VIACTIV PO) Take by mouth. Reported on 10/14/2015    . cetirizine (ZYRTEC) 10 MG tablet Take 10 mg by mouth daily.      . Estradiol (VAGIFEM) 10 MCG TABS Place  vaginally.      . fish oil-omega-3 fatty acids 1000 MG capsule Take 2 g by mouth daily.      . Multiple Vitamins-Minerals (MULTIVITAMIN WITH MINERALS) tablet Take 1 tablet by mouth daily.      . NON FORMULARY Allergy shots     . psyllium (METAMUCIL) 58.6 % packet Take 1 packet by mouth daily.       No facility-administered medications prior to visit.      EXAM:  BP 132/88   Temp 98.1 F (36.7 C) (Oral)   Ht 5' 3.5" (1.613 m)   Wt 153 lb 9.6 oz (69.7 kg)   BMI 26.78 kg/m   Body mass index is 26.78 kg/m.  Physical Exam: Vital signs reviewed [id number] is a well-developed well-nourished alert cooperative    who appearsr stated age in no acute distress.  HEENT: normocephalic atraumatic , Eyes: PERRL EOM's full, conjunctiva  clear, Nares: paten,t no deformity discharge or tenderness., Ears: no deformity EAC's clear TMs with normal landmarks. Mouth: clear OP, no lesions, edema.  Moist mucous membranes. Dentition in adequate repair. NECK: supple without masses, thyromegaly or bruits.Breast: normal by inspection . No dimpling, discharge, masses, tenderness or discharge . CHEST/PULM:  Clear to auscultation and percussion breath sounds equal no wheeze , rales or rhonchi. No chest wall deformities or tenderness. CV: PMI is nondisplaced, S1 S2 no gallops, murmurs, rubs. Peripheral pulses are full without delay.No JVD .  ABDOMEN: Bowel sounds normal nontender  No guard or rebound, no hepato splenomegal no CVA tenderness.   Extremtities:  No clubbing cyanosis or edema, no acute joint swelling or redness no focal atrophy NEURO:  Oriented x3, cranial nerves 3-12 appear to be intact, no obvious focal weakness,gait within normal limits no abnormal reflexes or asymmetrical SKIN: No acute rashes normal turgor, color, no bruising or petechiae. PSYCH: Oriented, good eye contact, no obvious depression anxiety, cognition and judgment appear normal. LN: no cervical axillary inguinal adenopathy  Lab Results  Component Value Date   WBC 4.5 03/22/2016   HGB 13.8 03/22/2016   HCT 40.5 03/22/2016   PLT 345.0 03/22/2016   GLUCOSE 97 03/22/2016   CHOL 172 03/22/2016   TRIG 61.0 03/22/2016   HDL 60.70 03/22/2016   LDLDIRECT 132.6 04/16/2011   LDLCALC 99 03/22/2016   ALT 24 03/22/2016   AST 20 03/22/2016   NA 143 03/22/2016   K 4.1 03/22/2016   CL 110 03/22/2016   CREATININE 0.79 03/22/2016   BUN 15 03/22/2016   CO2 28 03/22/2016   TSH 1.94 03/22/2016  reviewed with patient   ASSESSMENT AND PLAN:  Discussed the following assessment and plan:  Visit for preventive health examination    Blood pressure is good when she donates blood in the 120s or below range. Can check on her own. Counseled about prevention and screening  risk-benefit. Antihistamine associations but it appears that it helps her more than harms her at this time. Try to keep her exercise up despite her left shoulder predicament. Patient Care Team: Burnis Medin, MD as PCP - General Mosetta Anis, MD (Allergy) Dian Queen, MD (Obstetrics and Gynecology) Patient Instructions   Continue lifestyle intervention healthy eating and exercise .  Healthy lifestyle includes : At least 150 minutes of exercise weeks  , weight at healthy levels, which is usually   BMI 19-25. Avoid trans fats and processed foods;  Increase fresh fruits and veges to 5 servings per day. And avoid sweet beverages including tea and juice. Mediterranean  diet with olive oil and nuts have been noted to be heart and brain healthy . Avoid tobacco products . Limit  alcohol to  7 per week for women and 14 servings for men.  Get adequate sleep . Wear seat belts . Don't text and drive .   If all ok then check up in a year  Or as needed. BP Readings from Last 3 Encounters:  03/29/16 (!) 138/94  03/26/15 126/80  01/25/13 138/86   Wt Readings from Last 3 Encounters:  03/29/16 153 lb 9.6 oz (69.7 kg)  03/26/15 150 lb 14.4 oz (68.4 kg)  01/25/13 158 lb (71.7 kg)      Riggs Dineen K. Samaya Boardley M.D.

## 2016-03-29 ENCOUNTER — Ambulatory Visit (INDEPENDENT_AMBULATORY_CARE_PROVIDER_SITE_OTHER): Payer: BLUE CROSS/BLUE SHIELD | Admitting: Internal Medicine

## 2016-03-29 ENCOUNTER — Encounter: Payer: Self-pay | Admitting: Internal Medicine

## 2016-03-29 VITALS — BP 132/88 | Temp 98.1°F | Ht 63.5 in | Wt 153.6 lb

## 2016-03-29 DIAGNOSIS — Z0000 Encounter for general adult medical examination without abnormal findings: Secondary | ICD-10-CM

## 2016-03-29 NOTE — Patient Instructions (Signed)
Continue lifestyle intervention healthy eating and exercise .  Healthy lifestyle includes : At least 150 minutes of exercise weeks  , weight at healthy levels, which is usually   BMI 19-25. Avoid trans fats and processed foods;  Increase fresh fruits and veges to 5 servings per day. And avoid sweet beverages including tea and juice. Mediterranean diet with olive oil and nuts have been noted to be heart and brain healthy . Avoid tobacco products . Limit  alcohol to  7 per week for women and 14 servings for men.  Get adequate sleep . Wear seat belts . Don't text and drive .   If all ok then check up in a year  Or as needed. BP Readings from Last 3 Encounters:  03/29/16 (!) 138/94  03/26/15 126/80  01/25/13 138/86   Wt Readings from Last 3 Encounters:  03/29/16 153 lb 9.6 oz (69.7 kg)  03/26/15 150 lb 14.4 oz (68.4 kg)  01/25/13 158 lb (71.7 kg)

## 2016-04-27 ENCOUNTER — Encounter: Payer: Self-pay | Admitting: Gastroenterology

## 2016-06-23 ENCOUNTER — Ambulatory Visit (AMBULATORY_SURGERY_CENTER): Payer: Self-pay | Admitting: *Deleted

## 2016-06-23 ENCOUNTER — Encounter: Payer: Self-pay | Admitting: Gastroenterology

## 2016-06-23 VITALS — Ht 62.0 in | Wt 155.0 lb

## 2016-06-23 DIAGNOSIS — Z800 Family history of malignant neoplasm of digestive organs: Secondary | ICD-10-CM

## 2016-06-23 MED ORDER — NA SULFATE-K SULFATE-MG SULF 17.5-3.13-1.6 GM/177ML PO SOLN: ORAL | 0 refills | Status: DC

## 2016-06-23 NOTE — Progress Notes (Signed)
Patient denies any allergies to eggs or soy. Patient denies any problems with anesthesia/sedation. Patient denies any oxygen use at home and does not take any diet/weight loss medications. EMMI education declined by patient.  

## 2016-07-07 ENCOUNTER — Ambulatory Visit (AMBULATORY_SURGERY_CENTER): Payer: BLUE CROSS/BLUE SHIELD | Admitting: Gastroenterology

## 2016-07-07 ENCOUNTER — Encounter: Payer: Self-pay | Admitting: Gastroenterology

## 2016-07-07 VITALS — BP 117/69 | HR 65 | Temp 98.6°F | Resp 14 | Ht 62.0 in | Wt 155.0 lb

## 2016-07-07 DIAGNOSIS — Z800 Family history of malignant neoplasm of digestive organs: Secondary | ICD-10-CM | POA: Diagnosis not present

## 2016-07-07 DIAGNOSIS — Z1211 Encounter for screening for malignant neoplasm of colon: Secondary | ICD-10-CM | POA: Diagnosis present

## 2016-07-07 DIAGNOSIS — K5730 Diverticulosis of large intestine without perforation or abscess without bleeding: Secondary | ICD-10-CM | POA: Diagnosis not present

## 2016-07-07 MED ORDER — SODIUM CHLORIDE 0.9 % IV SOLN: 500.0000 mL | INTRAVENOUS | Status: DC

## 2016-07-07 NOTE — Op Note (Signed)
[place] Patient Name: Gracelynne Kuchar Procedure Date: 07/07/2016 9:58 AM MRN: [id number] Endoscopist: Mauri Pole , MD Age: 66 Referring MD:  Date of Birth: Jul 04, 1950 Gender: Female Account #: [id number] Procedure:                Colonoscopy Indications:              Screening in patient at increased risk: Family                            history of 1st-degree relative with colorectal                            cancer Medicines:                Monitored Anesthesia Care Procedure:                Pre-Anesthesia Assessment:                           - Prior to the procedure, a History and Physical                            was performed, and patient medications and                            allergies were reviewed. The patient's tolerance of                            previous anesthesia was also reviewed. The risks                            and benefits of the procedure and the sedation                            options and risks were discussed with the patient.                            All questions were answered, and informed consent                            was obtained. Prior Anticoagulants: The patient has                            taken no previous anticoagulant or antiplatelet                            agents. ASA Grade Assessment: II - A patient with                            mild systemic disease. After reviewing the risks                            and benefits, the patient was deemed in  satisfactory condition to undergo the procedure.                           After obtaining informed consent, the colonoscope                            was passed under direct vision. Throughout the                            procedure, the patient's blood pressure, pulse, and                            oxygen saturations were monitored continuously. The                            Model CF-HQ190L [id number]) scope was introduced                           through the anus and advanced to the the terminal                            ileum, with identification of the appendiceal                            orifice and IC valve. The colonoscopy was performed                            without difficulty. The patient tolerated the                            procedure well. The quality of the bowel                            preparation was excellent. The terminal ileum,                            ileocecal valve, appendiceal orifice, and rectum                            were photographed. Scope In: 10:04:54 AM Scope Out: 10:16:27 AM Scope Withdrawal Time: 0 hours 8 minutes 39 seconds  Total Procedure Duration: 0 hours 11 minutes 33 seconds  Findings:                 The perianal and digital rectal examinations were                            normal.                           Non-bleeding internal hemorrhoids were found during                            retroflexion. The hemorrhoids were small.  Scattered small-mouthed diverticula were found in                            the sigmoid colon and descending colon.                           The exam was otherwise without abnormality. Complications:            No immediate complications. Estimated Blood Loss:     Estimated blood loss: none. Impression:               - Non-bleeding internal hemorrhoids.                           - Diverticulosis in the sigmoid colon and in the                            descending colon.                           - The examination was otherwise normal.                           - No specimens collected. Recommendation:           - Patient has a contact number available for                            emergencies. The signs and symptoms of potential                            delayed complications were discussed with the                            patient. Return to normal activities tomorrow.                             Written discharge instructions were provided to the                            patient.                           - Resume previous diet.                           - Continue present medications.                           - Await pathology results.                           - Repeat colonoscopy in 5 years for screening                            purposes.                           -  Return to GI clinic PRN. Mauri Pole, MD 07/07/2016 10:22:41 AM This report has been signed electronically.

## 2016-07-07 NOTE — Patient Instructions (Signed)
YOU HAD AN ENDOSCOPIC PROCEDURE TODAY AT [facility]:   Refer to the procedure report that was given to you for any specific questions about what was found during the examination.  If the procedure report does not answer your questions, please call your gastroenterologist to clarify.  If you requested that your care partner not be given the details of your procedure findings, then the procedure report has been included in a sealed envelope for you to review at your convenience later.  YOU SHOULD EXPECT: Some feelings of bloating in the abdomen. Passage of more gas than usual.  Walking can help get rid of the air that was put into your GI tract during the procedure and reduce the bloating. If you had a lower endoscopy (such as a colonoscopy or flexible sigmoidoscopy) you may notice spotting of blood in your stool or on the toilet paper. If you underwent a bowel prep for your procedure, you may not have a normal bowel movement for a few days.  Please Note:  You might notice some irritation and congestion in your nose or some drainage.  This is from the oxygen used during your procedure.  There is no need for concern and it should clear up in a day or so.  SYMPTOMS TO REPORT IMMEDIATELY:   Following lower endoscopy (colonoscopy or flexible sigmoidoscopy):  Excessive amounts of blood in the stool  Significant tenderness or worsening of abdominal pains  Swelling of the abdomen that is new, acute  Fever of 100F or higher    For urgent or emergent issues, a gastroenterologist can be reached at any hour by calling [phone].   DIET:  We do recommend a small meal at first, but then you may proceed to your regular diet.  Drink plenty of fluids but you should avoid alcoholic beverages for 24 hours.  ACTIVITY:  You should plan to take it easy for the rest of today and you should NOT DRIVE or use heavy machinery until tomorrow (because of the sedation medicines used during the test).     FOLLOW UP: Our staff will call the number listed on your records the next business day following your procedure to check on you and address any questions or concerns that you may have regarding the information given to you following your procedure. If we do not reach you, we will leave a message.  However, if you are feeling well and you are not experiencing any problems, there is no need to return our call.  We will assume that you have returned to your regular daily activities without incident.  If any biopsies were taken you will be contacted by phone or by letter within the next 1-3 weeks.  Please call us at [phone] if you have not heard about the biopsies in 3 weeks.    SIGNATURES/CONFIDENTIALITY: You and/or your care partner have signed paperwork which will be entered into your electronic medical record.  These signatures attest to the fact that that the information above on your After Visit Summary has been reviewed and is understood.  Full responsibility of the confidentiality of this discharge information lies with you and/or your care-partner.   INFORMATION ON DIVERTICULOSIS & HEMORRHOIDS GIVEN TO YOU TODAY  RESUME PREVIOUS DIET AND PREVIOUS MEDICATIONS

## 2016-07-07 NOTE — Progress Notes (Signed)
A/ox3 pleased with MAC, report to [facility]

## 2016-07-08 ENCOUNTER — Telehealth: Payer: Self-pay

## 2016-07-08 NOTE — Telephone Encounter (Signed)
  Follow up Call-  Call back number 07/07/2016  Post procedure Call Back phone  # [phone]  Permission to leave phone message Yes  Some recent data might be hidden     Patient questions:  Do you have a fever, pain , or abdominal swelling? No. Pain Score  0 *  Have you tolerated food without any problems? Yes.    Have you been able to return to your normal activities? Yes.    Do you have any questions about your discharge instructions: Diet   No. Medications  No. Follow up visit  No.  Do you have questions or concerns about your Care? No.  Actions: * If pain score is 4 or above: No action needed, pain <4.

## 2016-08-02 NOTE — Progress Notes (Signed)
Chief Complaint  Patient presents with  . Nasal Congestion    a few weeks ago  . Cough  . Sore Throat    HPI: Kimberly Schroeder 66 y.o.  sda  osnet  Like a head cold  Rhinorrhea .  2 weeks ago and had a cough at the beginning but no fever. Over the last week she's had continued sinus pressure spike decongestants and usual care. Has allergy shots but this doesn't feel like allergy.  Sinus rinses   Had to stop cause of pain   And pressure .    Burning right more clogged on the left.  2 weeks illness  And worse  Last week  But stalled in improvement  Not allergic  On allergy shots .  Mostly clear  Dc but some green at times.     ROS: See pertinent positives and negatives per HPI. No fever chest pain shortness of breath or wheezing. Has had nasal irritation with Flonase medicines in the past.  Past Medical History:  Diagnosis Date  . Allergic rhinitis    allergist evaluation spring fall and dust.  . Ectopic pregnancy    x 2  . GERD (gastroesophageal reflux disease)   . Hematuria, microscopic    eval 2001  Dr Terance Hart    Family History  Problem Relation Age of Onset  . Colon cancer Father 10  . Colon cancer Paternal Uncle     42's  . Parkinsonism    . Arthritis    . Hypertension      Social History   Social History  . Marital status: Married    Spouse name: N/A  . Number of children: N/A  . Years of education: N/A   Social History Main Topics  . Smoking status: Never Smoker  . Smokeless tobacco: Never Used  . Alcohol use Yes     Comment: occasional wine  . Drug use: No  . Sexual activity: Not Asked   Other Topics Concern  . None   Social History Narrative   Married   HH of 2 and rescue dog   Exercises   G2   Husband recovering from severe myocardiopathy viral and chf getting better will not need a heart transplant   Has had ileitis with  pouch   Husband  Retired    No ets.    [employer]traking  Family elederly.     Outpatient Medications Prior to  Visit  Medication Sig Dispense Refill  . Calcium-Vitamin D-Vitamin K (VIACTIV PO) Take by mouth. Reported on 10/14/2015    . cetirizine (ZYRTEC) 10 MG tablet Take 10 mg by mouth daily.      Marland Kitchen EPIPEN 2-PAK 0.3 MG/0.3ML SOAJ injection USE AS DIRECTED AS NEEDED  1  . Estradiol (VAGIFEM) 10 MCG TABS Place vaginally.      . fish oil-omega-3 fatty acids 1000 MG capsule Take 2 g by mouth daily.      . Multiple Vitamins-Minerals (MULTIVITAMIN WITH MINERALS) tablet Take 1 tablet by mouth daily.      . NON FORMULARY Allergy shots     . psyllium (METAMUCIL) 58.6 % packet Take 1 packet by mouth daily.       Facility-Administered Medications Prior to Visit  Medication Dose Route Frequency Provider Last Rate Last Dose  . 0.9 %  sodium chloride infusion  500 mL Intravenous Continuous Kavitha Nandigam V, MD         EXAM:  BP 124/80 (BP Location: Right Arm, Patient Position:  Sitting, Cuff Size: Normal)   Pulse 87   Temp 97.8 F (36.6 C) (Oral)   Ht 5\' 2"  (1.575 m)   Wt 154 lb 6.4 oz (70 kg)   SpO2 98%   BMI 28.24 kg/m   Body mass index is 28.24 kg/m.  GENERAL: vitals reviewed and listed above, alert, oriented, appears well hydrated and in no acute distressAppears quite congested HEENT: atraumatic, conjunctiva  clear, no obvious abnormalities on inspection of external nose and ears TMs are clear nares increased turbinate face minimally tender OP : no lesion edema or exudate cobblestoning present. NECK: no obvious masses on inspection palpation  LUNGS: clear to auscultation bilaterally, no wheezes, rales or rhonchi, good air movement CV: HRRR, no clubbing cyanosis or  peripheral edema nl cap refill  MS: moves all extremities without noticeable focal  abnormality PSYCH: pleasant and cooperative, no obvious depression or anxiety  ASSESSMENT AND PLAN:  Discussed the following assessment and plan:  Other acute sinusitis, recurrence not specified  Protracted URI  Chronic allergic rhinitis,  unspecified seasonality, unspecified trigger   Expectant management. Fu alarm sx  Will try  A different INCS  -Patient advised to return or notify health care team  if symptoms worsen ,persist or new concerns arise.  Patient Instructions  Treating you for sinusitis   Antibiotic and nasal cortisone   Saline  Spray ok   Expect improvement in the next 3-4 days    Fu if   persistent or progressive    Sinusitis, Adult Sinusitis is soreness and inflammation of your sinuses. Sinuses are hollow spaces in the bones around your face. Your sinuses are located:  Around your eyes.  In the middle of your forehead.  Behind your nose.  In your cheekbones. Your sinuses and nasal passages are lined with a stringy fluid (mucus). Mucus normally drains out of your sinuses. When your nasal tissues become inflamed or swollen, the mucus can become trapped or blocked so air cannot flow through your sinuses. This allows bacteria, viruses, and funguses to grow, which leads to infection. Sinusitis can develop quickly and last for 7?10 days (acute) or for more than 12 weeks (chronic). Sinusitis often develops after a cold. What are the causes? This condition is caused by anything that creates swelling in the sinuses or stops mucus from draining, including:  Allergies.  Asthma.  Bacterial or viral infection.  Abnormally shaped bones between the nasal passages.  Nasal growths that contain mucus (nasal polyps).  Narrow sinus openings.  Pollutants, such as chemicals or irritants in the air.  A foreign object stuck in the nose.  A fungal infection. This is rare. What increases the risk? The following factors may make you more likely to develop this condition:  Having allergies or asthma.  Having had a recent cold or respiratory tract infection.  Having structural deformities or blockages in your nose or sinuses.  Having a weak immune system.  Doing a lot of swimming or diving.  Overusing  nasal sprays.  Smoking. What are the signs or symptoms? The main symptoms of this condition are pain and a feeling of pressure around the affected sinuses. Other symptoms include:  Upper toothache.  Earache.  Headache.  Bad breath.  Decreased sense of smell and taste.  A cough that may get worse at night.  Fatigue.  Fever.  Thick drainage from your nose. The drainage is often green and it may contain pus (purulent).  Stuffy nose or congestion.  Postnasal drip. This is when  extra mucus collects in the throat or back of the nose.  Swelling and warmth over the affected sinuses.  Sore throat.  Sensitivity to light. How is this diagnosed? This condition is diagnosed based on symptoms, a medical history, and a physical exam. To find out if your condition is acute or chronic, your health care provider may:  Look in your nose for signs of nasal polyps.  Tap over the affected sinus to check for signs of infection.  View the inside of your sinuses using an imaging device that has a light attached (endoscope). If your health care provider suspects that you have chronic sinusitis, you may also:  Be tested for allergies.  Have a sample of mucus taken from your nose (nasal culture) and checked for bacteria.  Have a mucus sample examined to see if your sinusitis is related to an allergy. If your sinusitis does not respond to treatment and it lasts longer than 8 weeks, you may have an MRI or CT scan to check your sinuses. These scans also help to determine how severe your infection is. In rare cases, a bone biopsy may be done to rule out more serious types of fungal sinus disease. How is this treated? Treatment for sinusitis depends on the cause and whether your condition is chronic or acute. If a virus is causing your sinusitis, your symptoms will go away on their own within 10 days. You may be given medicines to relieve your symptoms, including:  Topical nasal decongestants.  They shrink swollen nasal passages and let mucus drain from your sinuses.  Antihistamines. These drugs block inflammation that is triggered by allergies. This can help to ease swelling in your nose and sinuses.  Topical nasal corticosteroids. These are nasal sprays that ease inflammation and swelling in your nose and sinuses.  Nasal saline washes. These rinses can help to get rid of thick mucus in your nose. If your condition is caused by bacteria, you will be given an antibiotic medicine. If your condition is caused by a fungus, you will be given an antifungal medicine. Surgery may be needed to correct underlying conditions, such as narrow nasal passages. Surgery may also be needed to remove polyps. Follow these instructions at home: Medicines   Take, use, or apply over-the-counter and prescription medicines only as told by your health care provider. These may include nasal sprays.  If you were prescribed an antibiotic medicine, take it as told by your health care provider. Do not stop taking the antibiotic even if you start to feel better. Hydrate and Humidify   Drink enough water to keep your urine clear or pale yellow. Staying hydrated will help to thin your mucus.  Use a cool mist humidifier to keep the humidity level in your home above 50%.  Inhale steam for 10-15 minutes, 3-4 times a day or as told by your health care provider. You can do this in the bathroom while a hot shower is running.  Limit your exposure to cool or dry air. Rest   Rest as much as possible.  Sleep with your head raised (elevated).  Make sure to get enough sleep each night. General instructions   Apply a warm, moist washcloth to your face 3-4 times a day or as told by your health care provider. This will help with discomfort.  Wash your hands often with soap and water to reduce your exposure to viruses and other germs. If soap and water are not available, use hand sanitizer.  Do not  smoke. Avoid being  around people who are smoking (secondhand smoke).  Keep all follow-up visits as told by your health care provider. This is important. Contact a health care provider if:  You have a fever.  Your symptoms get worse.  Your symptoms do not improve within 10 days. Get help right away if:  You have a severe headache.  You have persistent vomiting.  You have pain or swelling around your face or eyes.  You have vision problems.  You develop confusion.  Your neck is stiff.  You have trouble breathing. This information is not intended to replace advice given to you by your health care provider. Make sure you discuss any questions you have with your health care provider. Document Released: 05/03/2005 Document Revised: 12/28/2015 Document Reviewed: 02/26/2015 Elsevier Interactive Patient Education  2017 [place] K. Kainoa Swoboda M.D.

## 2016-08-03 ENCOUNTER — Ambulatory Visit (INDEPENDENT_AMBULATORY_CARE_PROVIDER_SITE_OTHER): Payer: BLUE CROSS/BLUE SHIELD | Admitting: Internal Medicine

## 2016-08-03 ENCOUNTER — Encounter: Payer: Self-pay | Admitting: Internal Medicine

## 2016-08-03 VITALS — BP 124/80 | HR 87 | Temp 97.8°F | Ht 62.0 in | Wt 154.4 lb

## 2016-08-03 DIAGNOSIS — J309 Allergic rhinitis, unspecified: Secondary | ICD-10-CM

## 2016-08-03 DIAGNOSIS — J018 Other acute sinusitis: Secondary | ICD-10-CM

## 2016-08-03 DIAGNOSIS — J069 Acute upper respiratory infection, unspecified: Secondary | ICD-10-CM | POA: Diagnosis not present

## 2016-08-03 MED ORDER — AMOXICILLIN-POT CLAVULANATE 875-125 MG PO TABS: 1.0000 | ORAL_TABLET | Freq: Two times a day (BID) | ORAL | 0 refills | Status: DC

## 2016-08-03 NOTE — Patient Instructions (Signed)
Treating you for sinusitis   Antibiotic and nasal cortisone   Saline  Spray ok   Expect improvement in the next 3-4 days    Fu if   persistent or progressive    Sinusitis, Adult Sinusitis is soreness and inflammation of your sinuses. Sinuses are hollow spaces in the bones around your face. Your sinuses are located:  Around your eyes.  In the middle of your forehead.  Behind your nose.  In your cheekbones. Your sinuses and nasal passages are lined with a stringy fluid (mucus). Mucus normally drains out of your sinuses. When your nasal tissues become inflamed or swollen, the mucus can become trapped or blocked so air cannot flow through your sinuses. This allows bacteria, viruses, and funguses to grow, which leads to infection. Sinusitis can develop quickly and last for 7?10 days (acute) or for more than 12 weeks (chronic). Sinusitis often develops after a cold. What are the causes? This condition is caused by anything that creates swelling in the sinuses or stops mucus from draining, including:  Allergies.  Asthma.  Bacterial or viral infection.  Abnormally shaped bones between the nasal passages.  Nasal growths that contain mucus (nasal polyps).  Narrow sinus openings.  Pollutants, such as chemicals or irritants in the air.  A foreign object stuck in the nose.  A fungal infection. This is rare. What increases the risk? The following factors may make you more likely to develop this condition:  Having allergies or asthma.  Having had a recent cold or respiratory tract infection.  Having structural deformities or blockages in your nose or sinuses.  Having a weak immune system.  Doing a lot of swimming or diving.  Overusing nasal sprays.  Smoking. What are the signs or symptoms? The main symptoms of this condition are pain and a feeling of pressure around the affected sinuses. Other symptoms include:  Upper toothache.  Earache.  Headache.  Bad  breath.  Decreased sense of smell and taste.  A cough that may get worse at night.  Fatigue.  Fever.  Thick drainage from your nose. The drainage is often green and it may contain pus (purulent).  Stuffy nose or congestion.  Postnasal drip. This is when extra mucus collects in the throat or back of the nose.  Swelling and warmth over the affected sinuses.  Sore throat.  Sensitivity to light. How is this diagnosed? This condition is diagnosed based on symptoms, a medical history, and a physical exam. To find out if your condition is acute or chronic, your health care provider may:  Look in your nose for signs of nasal polyps.  Tap over the affected sinus to check for signs of infection.  View the inside of your sinuses using an imaging device that has a light attached (endoscope). If your health care provider suspects that you have chronic sinusitis, you may also:  Be tested for allergies.  Have a sample of mucus taken from your nose (nasal culture) and checked for bacteria.  Have a mucus sample examined to see if your sinusitis is related to an allergy. If your sinusitis does not respond to treatment and it lasts longer than 8 weeks, you may have an MRI or CT scan to check your sinuses. These scans also help to determine how severe your infection is. In rare cases, a bone biopsy may be done to rule out more serious types of fungal sinus disease. How is this treated? Treatment for sinusitis depends on the cause and whether your  condition is chronic or acute. If a virus is causing your sinusitis, your symptoms will go away on their own within 10 days. You may be given medicines to relieve your symptoms, including:  Topical nasal decongestants. They shrink swollen nasal passages and let mucus drain from your sinuses.  Antihistamines. These drugs block inflammation that is triggered by allergies. This can help to ease swelling in your nose and sinuses.  Topical nasal  corticosteroids. These are nasal sprays that ease inflammation and swelling in your nose and sinuses.  Nasal saline washes. These rinses can help to get rid of thick mucus in your nose. If your condition is caused by bacteria, you will be given an antibiotic medicine. If your condition is caused by a fungus, you will be given an antifungal medicine. Surgery may be needed to correct underlying conditions, such as narrow nasal passages. Surgery may also be needed to remove polyps. Follow these instructions at home: Medicines   Take, use, or apply over-the-counter and prescription medicines only as told by your health care provider. These may include nasal sprays.  If you were prescribed an antibiotic medicine, take it as told by your health care provider. Do not stop taking the antibiotic even if you start to feel better. Hydrate and Humidify   Drink enough water to keep your urine clear or pale yellow. Staying hydrated will help to thin your mucus.  Use a cool mist humidifier to keep the humidity level in your home above 50%.  Inhale steam for 10-15 minutes, 3-4 times a day or as told by your health care provider. You can do this in the bathroom while a hot shower is running.  Limit your exposure to cool or dry air. Rest   Rest as much as possible.  Sleep with your head raised (elevated).  Make sure to get enough sleep each night. General instructions   Apply a warm, moist washcloth to your face 3-4 times a day or as told by your health care provider. This will help with discomfort.  Wash your hands often with soap and water to reduce your exposure to viruses and other germs. If soap and water are not available, use hand sanitizer.  Do not smoke. Avoid being around people who are smoking (secondhand smoke).  Keep all follow-up visits as told by your health care provider. This is important. Contact a health care provider if:  You have a fever.  Your symptoms get worse.  Your  symptoms do not improve within 10 days. Get help right away if:  You have a severe headache.  You have persistent vomiting.  You have pain or swelling around your face or eyes.  You have vision problems.  You develop confusion.  Your neck is stiff.  You have trouble breathing. This information is not intended to replace advice given to you by your health care provider. Make sure you discuss any questions you have with your health care provider. Document Released: 05/03/2005 Document Revised: 12/28/2015 Document Reviewed: 02/26/2015 Elsevier Interactive Patient Education  2017 [employer].

## 2017-02-04 ENCOUNTER — Encounter: Payer: Self-pay | Admitting: Internal Medicine

## 2017-03-04 ENCOUNTER — Ambulatory Visit (INDEPENDENT_AMBULATORY_CARE_PROVIDER_SITE_OTHER): Payer: BLUE CROSS/BLUE SHIELD | Admitting: Emergency Medicine

## 2017-03-04 DIAGNOSIS — Z23 Encounter for immunization: Secondary | ICD-10-CM | POA: Diagnosis not present

## 2017-03-28 ENCOUNTER — Other Ambulatory Visit: Payer: BLUE CROSS/BLUE SHIELD

## 2017-03-28 NOTE — Progress Notes (Signed)
Chief Complaint  Patient presents with  . Annual Exam    No new concerns    HPI: Patient  Kimberly Schroeder  66 y.o. comes in today for Preventive Health Care visit   Allergies stable  No injuries .  Gets colon every 5 years  utd gyne   Health Maintenance  Topic Date Due  . TETANUS/TDAP  04/28/2016  . MAMMOGRAM  07/15/2016  . PAP SMEAR  07/15/2017  . COLONOSCOPY  07/08/2021  . INFLUENZA VACCINE  Completed   Health Maintenance Review LIFESTYLE:  Exercise:   Walk 2 miles per day and curves . Shoulder limitations  Tobacco/ETS: no Alcohol:  minimun Sugar beverages:  Honey in tea  Sleep: at least 7  Drug use: no   HH of  :  hh of 2 1 petr  Work:  3 - 4 hours  No FA wears seat belts smoke detector  ROS:  GEN/ HEENT: No fever, significant weight changes sweats headaches vision problems hearing changes, CV/ PULM; No chest pain shortness of breath cough, syncope,edema  change in exercise tolerance. GI /GU: No adominal pain, vomiting, change in bowel habits. No blood in the stool. No significant GU symptoms. SKIN/HEME: ,no acute skin rashes suspicious lesions or bleeding. No lymphadenopathy, nodules, masses.  NEURO/ PSYCH:  No neurologic signs such as weakness numbness. No depression anxiety. IMM/ Allergy: No unusual infections.  Allergy .   REST of 12 system review negative except as per HPI   Past Medical History:  Diagnosis Date  . Allergic rhinitis    allergist evaluation spring fall and dust.  . Ectopic pregnancy    x 2  . GERD (gastroesophageal reflux disease)   . Hematuria, microscopic    eval 2001  Dr Terance Hart    Past Surgical History:  Procedure Laterality Date  . LAPAROSCOPY     ectopic pregnancy  . LAPAROSCOPY FOR ECTOPIC PREGNANCY     x2    Family History  Problem Relation Age of Onset  . Colon cancer Father 1  . Colon cancer Paternal Uncle        35's  . Parkinsonism Unknown   . Arthritis Unknown   . Hypertension Unknown     Social History    Socioeconomic History  . Marital status: Married    Spouse name: None  . Number of children: None  . Years of education: None  . Highest education level: None  Social Needs  . Financial resource strain: None  . Food insecurity - worry: None  . Food insecurity - inability: None  . Transportation needs - medical: None  . Transportation needs - non-medical: None  Occupational History  . None  Tobacco Use  . Smoking status: Never Smoker  . Smokeless tobacco: Never Used  Substance and Sexual Activity  . Alcohol use: Yes    Comment: occasional wine  . Drug use: No  . Sexual activity: None  Other Topics Concern  . None  Social History Narrative   Married   HH of 2 and rescue dog   Exercises   G2   Husband recovering from severe myocardiopathy viral and chf getting better will not need a heart transplant   Has had ileitis with  pouch   Husband  Retired    No ets.    caretraking  Family elederly.     Outpatient Medications Prior to Visit  Medication Sig Dispense Refill  . Calcium-Vitamin D-Vitamin K (VIACTIV PO) Take by mouth. Reported on 10/14/2015    .  cetirizine (ZYRTEC) 10 MG tablet Take 10 mg by mouth daily.      Marland Kitchen EPIPEN 2-PAK 0.3 MG/0.3ML SOAJ injection USE AS DIRECTED AS NEEDED  1  . Estradiol (VAGIFEM) 10 MCG TABS Place vaginally.      . fish oil-omega-3 fatty acids 1000 MG capsule Take 2 g by mouth daily.      . Multiple Vitamins-Minerals (MULTIVITAMIN WITH MINERALS) tablet Take 1 tablet by mouth daily.      . NON FORMULARY Allergy shots     . psyllium (METAMUCIL) 58.6 % packet Take 1 packet by mouth daily.      Marland Kitchen amoxicillin-clavulanate (AUGMENTIN) 875-125 MG tablet Take 1 tablet by mouth every 12 (twelve) hours. (Patient not taking: Reported on 03/29/2017) 14 tablet 0   Facility-Administered Medications Prior to Visit  Medication Dose Route Frequency Provider Last Rate Last Dose  . 0.9 %  sodium chloride infusion  500 mL Intravenous Continuous Nandigam, Kavitha  V, MD         EXAM:  BP 112/82 (BP Location: Left Arm, Patient Position: Sitting, Cuff Size: Normal)   Pulse 82   Temp (!) 97.5 F (36.4 C) (Oral)   Ht 5' 1.75" (1.568 m)   Wt 154 lb 11.2 oz (70.2 kg)   BMI 28.52 kg/m   Body mass index is 28.52 kg/m. Wt Readings from Last 3 Encounters:  03/29/17 154 lb 11.2 oz (70.2 kg)  08/03/16 154 lb 6.4 oz (70 kg)  07/07/16 155 lb (70.3 kg)    Physical Exam: Vital signs reviewed PTW:SFKC is a well-developed well-nourished alert cooperative    who appearsr stated age in no acute distress.  HEENT: normocephalic atraumatic , Eyes: PERRL EOM's full, conjunctiva clear, Nares: paten,t no deformity discharge or tenderness., Ears: no deformity EAC's clear TMs with normal landmarks. Mouth: clear OP, no lesions, edema.  Moist mucous membranes. Dentition in adequate repair. NECK: supple without masses, thyromegaly or bruits. CHEST/PULM:  Clear to auscultation and percussion breath sounds equal no wheeze , rales or rhonchi. No chest wall deformities or tenderness. Breast:  Deferred gyne  . CV: PMI is nondisplaced, S1 S2 no gallops, murmurs, rubs. Peripheral pulses are full without delay.No JVD .  ABDOMEN: Bowel sounds normal nontender  No guard or rebound, no hepato splenomegal no CVA tenderness.  No hernia. Extremtities:  No clubbing cyanosis or edema, no acute joint swelling or redness no focal atrophy NEURO:  Oriented x3, cranial nerves 3-12 appear to be intact, no obvious focal weakness,gait within normal limits no abnormal reflexes or asymmetrical SKIN: No acute rashes normal turgor, color, no bruising or petechiae. PSYCH: Oriented, good eye contact, no obvious depression anxiety, cognition and judgment appear normal. LN: no cervical axillary inguinal adenopathy  Lab Results  Component Value Date   WBC 4.5 03/22/2016   HGB 13.8 03/22/2016   HCT 40.5 03/22/2016   PLT 345.0 03/22/2016   GLUCOSE 97 03/22/2016   CHOL 172 03/22/2016   TRIG 61.0  03/22/2016   HDL 60.70 03/22/2016   LDLDIRECT 132.6 04/16/2011   LDLCALC 99 03/22/2016   ALT 24 03/22/2016   AST 20 03/22/2016   NA 143 03/22/2016   K 4.1 03/22/2016   CL 110 03/22/2016   CREATININE 0.79 03/22/2016   BUN 15 03/22/2016   CO2 28 03/22/2016   TSH 1.94 03/22/2016    BP Readings from Last 3 Encounters:  03/29/17 112/82  08/03/16 124/80  07/07/16 117/69    Lab results from last year  reviewed with  patient   ASSESSMENT AND PLAN:  Discussed the following assessment and plan:  Visit for preventive health examination - Plan: Basic metabolic panel, Hemoglobin A1c  Elevated blood sugar - hx of same but no dm  will check fasting bmp and a1c today  - Plan: Basic metabolic panel, Hemoglobin A1c Neg fam hx fut hx of same  Disc screening   Bone health healthy exrcise and eagting to continue  shingrix vaccine rx given tdap today  Yearly  Check up or as needed  Patient Care Team: Burnis Medin, MD as PCP - General Mosetta Anis, MD (Allergy) Dian Queen, MD (Obstetrics and Gynecology) Mauri Pole, MD as Consulting Physician (Gastroenterology) Patient Instructions  Continue lifestyle intervention healthy eating and exercise . Heart healthy diet  .  tdap today. Will notify you  of labs when available.  Checking for elevated blood sugars .   Preventive Care 40-64 Years, Female Preventive care refers to lifestyle choices and visits with your health care provider that can promote health and wellness. What does preventive care include?  A yearly physical exam. This is also called an annual well check.  Dental exams once or twice a year.  Routine eye exams. Ask your health care provider how often you should have your eyes checked.  Personal lifestyle choices, including: ? Daily care of your teeth and gums. ? Regular physical activity. ? Eating a healthy diet. ? Avoiding tobacco and drug use. ? Limiting alcohol use. ? Practicing safe sex. ? Taking  low-dose aspirin daily starting at age 31. ? Taking vitamin and mineral supplements as recommended by your health care provider. What happens during an annual well check? The services and screenings done by your health care provider during your annual well check will depend on your age, overall health, lifestyle risk factors, and family history of disease. Counseling Your health care provider may ask you questions about your:  Alcohol use.  Tobacco use.  Drug use.  Emotional well-being.  Home and relationship well-being.  Sexual activity.  Eating habits.  Work and work Statistician.  Method of birth control.  Menstrual cycle.  Pregnancy history.  Screening You may have the following tests or measurements:  Height, weight, and BMI.  Blood pressure.  Lipid and cholesterol levels. These may be checked every 5 years, or more frequently if you are over 20 years old.  Skin check.  Lung cancer screening. You may have this screening every year starting at age 64 if you have a 30-pack-year history of smoking and currently smoke or have quit within the past 15 years.  Fecal occult blood test (FOBT) of the stool. You may have this test every year starting at age 74.  Flexible sigmoidoscopy or colonoscopy. You may have a sigmoidoscopy every 5 years or a colonoscopy every 10 years starting at age 23.  Hepatitis C blood test.  Hepatitis B blood test.  Sexually transmitted disease (STD) testing.  Diabetes screening. This is done by checking your blood sugar (glucose) after you have not eaten for a while (fasting). You may have this done every 1-3 years.  Mammogram. This may be done every 1-2 years. Talk to your health care provider about when you should start having regular mammograms. This may depend on whether you have a family history of breast cancer.  BRCA-related cancer screening. This may be done if you have a family history of breast, ovarian, tubal, or peritoneal  cancers.  Pelvic exam and Pap test. This may be done  every 3 years starting at age 15. Starting at age 32, this may be done every 5 years if you have a Pap test in combination with an HPV test.  Bone density scan. This is done to screen for osteoporosis. You may have this scan if you are at high risk for osteoporosis.  Discuss your test results, treatment options, and if necessary, the need for more tests with your health care provider. Vaccines Your health care provider may recommend certain vaccines, such as:  Influenza vaccine. This is recommended every year.  Tetanus, diphtheria, and acellular pertussis (Tdap, Td) vaccine. You may need a Td booster every 10 years.  Varicella vaccine. You may need this if you have not been vaccinated.  Zoster vaccine. You may need this after age 38.  Measles, mumps, and rubella (MMR) vaccine. You may need at least one dose of MMR if you were born in 1957 or later. You may also need a second dose.  Pneumococcal 13-valent conjugate (PCV13) vaccine. You may need this if you have certain conditions and were not previously vaccinated.  Pneumococcal polysaccharide (PPSV23) vaccine. You may need one or two doses if you smoke cigarettes or if you have certain conditions.  Meningococcal vaccine. You may need this if you have certain conditions.  Hepatitis A vaccine. You may need this if you have certain conditions or if you travel or work in places where you may be exposed to hepatitis A.  Hepatitis B vaccine. You may need this if you have certain conditions or if you travel or work in places where you may be exposed to hepatitis B.  Haemophilus influenzae type b (Hib) vaccine. You may need this if you have certain conditions.  Talk to your health care provider about which screenings and vaccines you need and how often you need them. This information is not intended to replace advice given to you by your health care provider. Make sure you discuss any  questions you have with your health care provider. Document Released: 05/30/2015 Document Revised: 01/21/2016 Document Reviewed: 03/04/2015 Elsevier Interactive Patient Education  2017 [employer]. Leylah Tarnow M.D.

## 2017-03-29 ENCOUNTER — Encounter: Payer: Self-pay | Admitting: Internal Medicine

## 2017-03-29 ENCOUNTER — Ambulatory Visit (INDEPENDENT_AMBULATORY_CARE_PROVIDER_SITE_OTHER): Payer: BLUE CROSS/BLUE SHIELD | Admitting: Internal Medicine

## 2017-03-29 VITALS — BP 112/82 | HR 82 | Temp 97.5°F | Ht 61.75 in | Wt 154.7 lb

## 2017-03-29 DIAGNOSIS — R739 Hyperglycemia, unspecified: Secondary | ICD-10-CM | POA: Diagnosis not present

## 2017-03-29 DIAGNOSIS — Z23 Encounter for immunization: Secondary | ICD-10-CM

## 2017-03-29 DIAGNOSIS — Z0000 Encounter for general adult medical examination without abnormal findings: Secondary | ICD-10-CM | POA: Diagnosis not present

## 2017-03-29 LAB — BASIC METABOLIC PANEL
BUN: 15 mg/dL (ref 6–23)
CHLORIDE: 105 meq/L (ref 96–112)
CO2: 29 meq/L (ref 19–32)
Calcium: 9.9 mg/dL (ref 8.4–10.5)
Creatinine, Ser: 0.9 mg/dL (ref 0.40–1.20)
GFR: 66.92 mL/min (ref >60.00)
GLUCOSE: 93 mg/dL (ref 70–99)
POTASSIUM: 4.7 meq/L (ref 3.5–5.1)
Sodium: 140 mEq/L (ref 135–145)

## 2017-03-29 LAB — HEMOGLOBIN A1C: Hgb A1c MFr Bld: 5.3 % (ref 4.6–6.5)

## 2017-03-29 MED ORDER — ZOSTER VAC RECOMB ADJUVANTED 50 MCG/0.5ML IM SUSR: 0.5000 mL | Freq: Once | INTRAMUSCULAR | 1 refills | Status: AC

## 2017-03-29 NOTE — Addendum Note (Signed)
Addended by: Virl Cagey on: 03/29/2017 11:17 AM   Modules accepted: Orders

## 2017-03-29 NOTE — Patient Instructions (Signed)
Continue lifestyle intervention healthy eating and exercise . Heart healthy diet  .  tdap today. Will notify you  of labs when available.  Checking for elevated blood sugars .   Preventive Care 40-64 Years, Female Preventive care refers to lifestyle choices and visits with your health care provider that can promote health and wellness. What does preventive care include?  A yearly physical exam. This is also called an annual well check.  Dental exams once or twice a year.  Routine eye exams. Ask your health care provider how often you should have your eyes checked.  Personal lifestyle choices, including: ? Daily care of your teeth and gums. ? Regular physical activity. ? Eating a healthy diet. ? Avoiding tobacco and drug use. ? Limiting alcohol use. ? Practicing safe sex. ? Taking low-dose aspirin daily starting at age 61. ? Taking vitamin and mineral supplements as recommended by your health care provider. What happens during an annual well check? The services and screenings done by your health care provider during your annual well check will depend on your age, overall health, lifestyle risk factors, and family history of disease. Counseling Your health care provider may ask you questions about your:  Alcohol use.  Tobacco use.  Drug use.  Emotional well-being.  Home and relationship well-being.  Sexual activity.  Eating habits.  Work and work Statistician.  Method of birth control.  Menstrual cycle.  Pregnancy history.  Screening You may have the following tests or measurements:  Height, weight, and BMI.  Blood pressure.  Lipid and cholesterol levels. These may be checked every 5 years, or more frequently if you are over 15 years old.  Skin check.  Lung cancer screening. You may have this screening every year starting at age 32 if you have a 30-pack-year history of smoking and currently smoke or have quit within the past 15 years.  Fecal occult blood  test (FOBT) of the stool. You may have this test every year starting at age 20.  Flexible sigmoidoscopy or colonoscopy. You may have a sigmoidoscopy every 5 years or a colonoscopy every 10 years starting at age 19.  Hepatitis C blood test.  Hepatitis B blood test.  Sexually transmitted disease (STD) testing.  Diabetes screening. This is done by checking your blood sugar (glucose) after you have not eaten for a while (fasting). You may have this done every 1-3 years.  Mammogram. This may be done every 1-2 years. Talk to your health care provider about when you should start having regular mammograms. This may depend on whether you have a family history of breast cancer.  BRCA-related cancer screening. This may be done if you have a family history of breast, ovarian, tubal, or peritoneal cancers.  Pelvic exam and Pap test. This may be done every 3 years starting at age 2. Starting at age 63, this may be done every 5 years if you have a Pap test in combination with an HPV test.  Bone density scan. This is done to screen for osteoporosis. You may have this scan if you are at high risk for osteoporosis.  Discuss your test results, treatment options, and if necessary, the need for more tests with your health care provider. Vaccines Your health care provider may recommend certain vaccines, such as:  Influenza vaccine. This is recommended every year.  Tetanus, diphtheria, and acellular pertussis (Tdap, Td) vaccine. You may need a Td booster every 10 years.  Varicella vaccine. You may need this if you have not  been vaccinated.  Zoster vaccine. You may need this after age 61.  Measles, mumps, and rubella (MMR) vaccine. You may need at least one dose of MMR if you were born in 1957 or later. You may also need a second dose.  Pneumococcal 13-valent conjugate (PCV13) vaccine. You may need this if you have certain conditions and were not previously vaccinated.  Pneumococcal polysaccharide  (PPSV23) vaccine. You may need one or two doses if you smoke cigarettes or if you have certain conditions.  Meningococcal vaccine. You may need this if you have certain conditions.  Hepatitis A vaccine. You may need this if you have certain conditions or if you travel or work in places where you may be exposed to hepatitis A.  Hepatitis B vaccine. You may need this if you have certain conditions or if you travel or work in places where you may be exposed to hepatitis B.  Haemophilus influenzae type b (Hib) vaccine. You may need this if you have certain conditions.  Talk to your health care provider about which screenings and vaccines you need and how often you need them. This information is not intended to replace advice given to you by your health care provider. Make sure you discuss any questions you have with your health care provider. Document Released: 05/30/2015 Document Revised: 01/21/2016 Document Reviewed: 03/04/2015 Elsevier Interactive Patient Education  2017 [employer].

## 2017-04-04 ENCOUNTER — Encounter: Payer: BLUE CROSS/BLUE SHIELD | Admitting: Internal Medicine

## 2017-05-04 ENCOUNTER — Telehealth: Payer: Self-pay | Admitting: *Deleted

## 2017-05-04 NOTE — Telephone Encounter (Signed)
Patient walked in c/o laceration to L lateral index finger. She was unpackaging a set of hedge trimmers and cut her finger about 45 minutes before arriving to the office. She washed the area with soap and warm water at home.   She is well appearing in NAD. Laceration is approx 1 cm and clean w/ minimal amount of sanguineous drainage and wound edges are well approximated w/ areas of excoriation and mild erythema surrounding wound. She is UTD on tetanus shot.   Patient held pressure until complete hemostasis was achieved. Wound was then irrigated w/ approximately 70cc NS and dried. Applied triple antibiotic ointment, vaseline gauze, and covered w/ non-adherent pad and wrapped w/ coban.   Patient instructed to leave dressing in place for 24 hours, then may remove, cleanse, and reapply dressing.  Instructed patient to schedule office visit if any S/S infection (purulent drainage, severe pain/swelling, excessive bleeding or fever). Patient verbalized understanding of instructions and agreed to plan.  Please advise in PCP absence if any additional recommendations at this time.

## 2017-05-04 NOTE — Telephone Encounter (Signed)
This sounds appropriate. Recheck prn.

## 2017-06-21 NOTE — Progress Notes (Signed)
Chief Complaint  Patient presents with  . Cough    Started 05/14/17, sore throat and congestion. Cough is occ productive. Pt states that congestion is maninly in her head. Pt states that the sore throat was the worst that she has ever had. Pt has used Mucinex and Flonase with decongestant cough med. Denies fever.     HPI: Kimberly Schroeder 67 y.o.  Onset with osre throat  Worse ever having and ur congestion and bad cough .   Improved  But not gone   Glands in back of throat  Hurt all the time  And    Some sinus pressure not too bad.     About 80 % better than at onset  But hangin on for the past month  Sinus  Sores throat glans in bad of throat are  Sore . No vomiting  Slight cough  Much better than at onset.  Just not getting better   Ears clogged.  No fever  husband s illness got better .  ROS: See pertinent positives and negatives per HPI.  Past Medical History:  Diagnosis Date  . Allergic rhinitis    allergist evaluation spring fall and dust.  . Ectopic pregnancy    x 2  . GERD (gastroesophageal reflux disease)   . Hematuria, microscopic    eval 2001  Dr Terance Hart    Family History  Problem Relation Age of Onset  . Colon cancer Father 74  . Colon cancer Paternal Uncle        49's  . Parkinsonism Unknown   . Arthritis Unknown   . Hypertension Unknown     Social History   Socioeconomic History  . Marital status: Married    Spouse name: Not on file  . Number of children: Not on file  . Years of education: Not on file  . Highest education level: Not on file  Social Needs  . Financial resource strain: Not on file  . Food insecurity - worry: Not on file  . Food insecurity - inability: Not on file  . Transportation needs - medical: Not on file  . Transportation needs - non-medical: Not on file  Occupational History  . Not on file  Tobacco Use  . Smoking status: Never Smoker  . Smokeless tobacco: Never Used  Substance and Sexual Activity  . Alcohol use: Yes   Comment: occasional wine  . Drug use: No  . Sexual activity: Not on file  Other Topics Concern  . 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     Outpatient Medications Prior to Visit  Medication Sig Dispense Refill  . Calcium-Vitamin D-Vitamin K (VIACTIV PO) Take by mouth. Reported on 10/14/2015    . cetirizine (ZYRTEC) 10 MG tablet Take 10 mg by mouth daily.      Marland Kitchen EPIPEN 2-PAK 0.3 MG/0.3ML SOAJ injection USE AS DIRECTED AS NEEDED  1  . Estradiol (VAGIFEM) 10 MCG TABS Place vaginally.      . fish oil-omega-3 fatty acids 1000 MG capsule Take 2 g by mouth daily.      . Multiple Vitamins-Minerals (MULTIVITAMIN WITH MINERALS) tablet Take 1 tablet  by mouth daily.      . NON FORMULARY Allergy shots     . psyllium (METAMUCIL) 58.6 % packet Take 1 packet by mouth daily.       Facility-Administered Medications Prior to Visit  Medication Dose Route Frequency Provider Last Rate Last Dose  . 0.9 %  sodium chloride infusion  500 mL Intravenous Continuous Nandigam, Kavitha V, MD         EXAM:  BP 124/88 (BP Location: Right Arm, Patient Position: Sitting, Cuff Size: Normal)   Pulse 90   Temp 98.3 F (36.8 C) (Oral)   Wt 153 lb 12.8 oz (69.8 kg)   SpO2 99%   BMI 28.36 kg/m   Body mass index is 28.36 kg/m. WDWN in NAD  quiet respirations; mildly congested  somewhat hoarse. Non toxic . HEENT: Normocephalic ;atraumatic , Eyes;  PERRL, EOMs  Full, lids and conjunctiva clear,,Ears: no deformities, canals nl, TM landmarks normal, Nose: no deformity or discharge but congested;face minimally tender Mouth : OP tonisllar area  Red  Tiny whit  Area left no edema  Neck: Supple without adenopathy or masses  Chest:  Clear to A&P without wheezes rales  or rhonchi CV:  S1-S2 no gallops or murmurs peripheral perfusion is normal Skin :nl perfusion and no acute rashes    ASSESSMENT AND PLAN:  Discussed the following assessment and plan:  Protracted URI  Pharyngitis, unspecified etiology  Acute sinusitis, recurrence not specified, unspecified location Suspected  Complicated infection   1` month of sx and ongoing findings  Poss sub acute sinusitis with  Pharyngitis . No other alarm findings     Expectant management.   empriric antibiotic and fu  If not improving  -Patient advised to return or notify health care team  if symptoms worsen ,persist or new concerns arise.  Patient Instructions  Treating for    Sinusitis    Complicating a viral respiratory  Infection .   And should help the throat.  Also   If    persistent or progressive after rx then plan fu .   Continue fluids mucinex and flonase ok .          Standley Brooking. Panosh M.D.

## 2017-06-22 ENCOUNTER — Encounter: Payer: Self-pay | Admitting: Internal Medicine

## 2017-06-22 ENCOUNTER — Ambulatory Visit: Payer: BLUE CROSS/BLUE SHIELD | Admitting: Internal Medicine

## 2017-06-22 VITALS — BP 124/88 | HR 90 | Temp 98.3°F | Wt 153.8 lb

## 2017-06-22 DIAGNOSIS — R739 Hyperglycemia, unspecified: Secondary | ICD-10-CM | POA: Insufficient documentation

## 2017-06-22 DIAGNOSIS — J029 Acute pharyngitis, unspecified: Secondary | ICD-10-CM | POA: Diagnosis not present

## 2017-06-22 DIAGNOSIS — J019 Acute sinusitis, unspecified: Secondary | ICD-10-CM

## 2017-06-22 DIAGNOSIS — J069 Acute upper respiratory infection, unspecified: Secondary | ICD-10-CM | POA: Diagnosis not present

## 2017-06-22 MED ORDER — AMOXICILLIN-POT CLAVULANATE 875-125 MG PO TABS: 1.0000 | ORAL_TABLET | Freq: Two times a day (BID) | ORAL | 0 refills | Status: DC

## 2017-06-22 NOTE — Patient Instructions (Addendum)
Treating for    Sinusitis    Complicating a viral respiratory  Infection .   And should help the throat.  Also   If    persistent or progressive after rx then plan fu .   Continue fluids mucinex and flonase ok .

## 2017-07-26 ENCOUNTER — Ambulatory Visit: Payer: BLUE CROSS/BLUE SHIELD | Admitting: Family Medicine

## 2017-07-26 ENCOUNTER — Encounter: Payer: Self-pay | Admitting: Family Medicine

## 2017-07-26 VITALS — BP 118/70 | HR 78 | Temp 97.9°F | Resp 12 | Ht 61.75 in | Wt 154.1 lb

## 2017-07-26 DIAGNOSIS — R300 Dysuria: Secondary | ICD-10-CM | POA: Diagnosis not present

## 2017-07-26 DIAGNOSIS — N390 Urinary tract infection, site not specified: Secondary | ICD-10-CM | POA: Diagnosis not present

## 2017-07-26 DIAGNOSIS — R0781 Pleurodynia: Secondary | ICD-10-CM | POA: Diagnosis not present

## 2017-07-26 DIAGNOSIS — R3129 Other microscopic hematuria: Secondary | ICD-10-CM | POA: Diagnosis not present

## 2017-07-26 LAB — POCT URINALYSIS DIPSTICK
BILIRUBIN UA: NEGATIVE
GLUCOSE UA: NEGATIVE
KETONES UA: NEGATIVE
Leukocytes, UA: NEGATIVE
Nitrite, UA: NEGATIVE
ODOR: NEGATIVE
Protein, UA: NEGATIVE
Spec Grav, UA: 1.015 (ref 1.010–1.025)
UROBILINOGEN UA: 0.2 U/dL
pH, UA: 5 (ref 5.0–8.0)

## 2017-07-26 MED ORDER — NITROFURANTOIN MONOHYD MACRO 100 MG PO CAPS: 100.0000 mg | ORAL_CAPSULE | Freq: Two times a day (BID) | ORAL | 0 refills | Status: AC

## 2017-07-26 NOTE — Progress Notes (Signed)
HPI:   [name] is a 67 y.o. female, who is here today complaining of urinary symptoms that started yesterday.  Dysuria: Burning sensation with urination. Urinary frequency: Denies Urinary urgency: Yes Incontinence: Denies Gross hematuria: Denies.  According to patient, she "always" has blood in the urine, microscopic.   No history of nephrolithiasis, no history of tobacco use.   Abdominal pain: Denies.  She is reporting "fullness" sensation on lower abdomen.  No changes in bowel habits, nausea, or vomiting.  Abnormal vaginal bleeding or discharge: Denies  LMP: Postmenopausal Sexual activity: No currently. Hx of UTI, Last one a couple years ago.  OTC medications for this problem: None.  She is also complaining about right sided back pain "for weeks and weeks", exacerbated by palpation. Pain is dull, 3/10, no radiated. When pain first started she took Advil for 3 days, which really helped but discontinued because she was afraid of side effects of NSAIDs. She has no noted saddle anesthesia or bowel incontinence. She denies rash or edema on affected area.  She denies history of trauma but about 3 weeks ago she had a "bad cough" and remembers having pain upon coughing. No fever,chills,or fatigue.   Review of Systems  Constitutional: Negative for activity change, appetite change, fatigue and fever.  Respiratory: Negative for cough, shortness of breath and wheezing.   Cardiovascular: Negative for chest pain.  Gastrointestinal: Negative for abdominal pain, nausea and vomiting.       No changes in bowel habits.  Genitourinary: Positive for dysuria and urgency. Negative for decreased urine volume, frequency, hematuria, vaginal bleeding and vaginal discharge.  Musculoskeletal: Positive for back pain. Negative for myalgias.  Skin: Negative for rash.  Neurological: Negative for weakness and numbness.  Hematological: Negative for adenopathy. Does not bruise/bleed  easily.  Psychiatric/Behavioral: Negative for confusion. The patient is nervous/anxious.       Current Outpatient Medications on File Prior to Visit  Medication Sig Dispense Refill  . Calcium-Vitamin D-Vitamin K (VIACTIV PO) Take by mouth. Reported on 10/14/2015    . cetirizine (ZYRTEC) 10 MG tablet Take 10 mg by mouth daily.      Marland Kitchen EPIPEN 2-PAK 0.3 MG/0.3ML SOAJ injection USE AS DIRECTED AS NEEDED  1  . Estradiol (VAGIFEM) 10 MCG TABS Place vaginally.      . fish oil-omega-3 fatty acids 1000 MG capsule Take 2 g by mouth daily.      . Multiple Vitamins-Minerals (MULTIVITAMIN WITH MINERALS) tablet Take 1 tablet by mouth daily.      . NON FORMULARY Allergy shots     . psyllium (METAMUCIL) 58.6 % packet Take 1 packet by mouth daily.      Marland Kitchen amoxicillin-clavulanate (AUGMENTIN) 875-125 MG tablet Take 1 tablet by mouth every 12 (twelve) hours. (Patient not taking: Reported on 07/26/2017) 14 tablet 0   Current Facility-Administered Medications on File Prior to Visit  Medication Dose Route Frequency Provider Last Rate Last Dose  . 0.9 %  sodium chloride infusion  500 mL Intravenous Continuous Nandigam, Venia Minks, MD         Past Medical History:  Diagnosis Date  . Allergic rhinitis    allergist evaluation spring fall and dust.  . Ectopic pregnancy    x 2  . GERD (gastroesophageal reflux disease)   . Hematuria, microscopic    eval 2001  Dr Terance Hart   Allergies  Allergen Reactions  . Iohexol Hives    IVP DYE    Social History  Socioeconomic History  . Marital status: Married    Spouse name: None  . Number of children: None  . Years of education: None  . Highest education level: None  Social Needs  . Financial resource strain: None  . Food insecurity - worry: None  . Food insecurity - inability: None  . Transportation needs - medical: None  . Transportation needs - non-medical: None  Occupational History  . None  Tobacco Use  . Smoking status: Never Smoker  . Smokeless  tobacco: Never Used  Substance and Sexual Activity  . Alcohol use: Yes    Comment: occasional wine  . Drug use: No  . Sexual activity: None  Other Topics Concern  . None  Social History Narrative   Married   HH of 2 and rescue dog   Exercises   G2   Husband recovering from severe myocardiopathy viral and chf getting better will not need a heart transplant   Has had ileitis with  pouch   Husband  Retired    No ets.    caretraking  Family elederly.     Vitals:   07/26/17 1221  BP: 118/70  Pulse: 78  Resp: 12  Temp: 97.9 F (36.6 C)  SpO2: 98%   Body mass index is 28.42 kg/m.    Physical Exam  Nursing note and vitals reviewed. Constitutional: She is oriented to person, place, and time. She appears well-developed. No distress.  HENT:  Head: Normocephalic and atraumatic.  Eyes: Conjunctivae are normal.  Cardiovascular: Normal rate and regular rhythm.  No murmur heard. Respiratory: Effort normal and breath sounds normal. No respiratory distress.  GI: Soft. She exhibits no mass. There is no hepatomegaly. There is tenderness in the suprapubic area. There is no rigidity, no guarding and no CVA tenderness.  Musculoskeletal: She exhibits no edema.       Thoracic back: She exhibits no tenderness and no bony tenderness.       Lumbar back: She exhibits no tenderness and no bony tenderness.  Tenderness upon palpation of posterior and lateral right rib cage.  No deformities, edema, or erythema appreciated.  Lymphadenopathy:    She has no cervical adenopathy.  Neurological: She is alert and oriented to person, place, and time. She has normal strength. Gait normal.  Skin: Skin is warm. No erythema.  Psychiatric: Her mood appears anxious.  Well-groomed, good eye contact.     ASSESSMENT AND PLAN:   [name] was seen today for back pain and dysuria.  Orders Placed This Encounter  Procedures  . Urine culture  . Urine Microscopic Only  . POC Urinalysis Dipstick      Dysuria  Urine dipstick abnormal, blood 3+,rest negative.  -     POC Urinalysis Dipstick -     Urine culture  Urinary tract infection without hematuria, site unspecified  Ucx ordered.   Empiric abx treatment started today and will be tailored according to Ucx results and susceptibility report.  Clearly instructed about warning signs. F/U if symptoms persist.  -     Urine culture -     nitrofurantoin, macrocrystal-monohydrate, (MACROBID) 100 MG capsule; Take 1 capsule (100 mg total) by mouth 2 (two) times daily for 5 days.   Hematuria, microscopic  She reported history of microscopic hematuria. Urine sent for microscopic, further recommendations will be given accordingly.  Costal margin pain  Pain seems to be musculoskeletal. I do not think imaging is necessary at this time. Instructed about warning signs. Recommend following with  PCP if pain is persistent for 3-4 weeks, before if pain gets worse.     -[name] was advised to return or notify a doctor immediately if symptoms worsen or persist or new concerns arise.       Ecko Beasley G. Martinique, MD  [facility]. [place] office.

## 2017-07-26 NOTE — Patient Instructions (Addendum)
  Ms.Latamara Ellis Savage I have seen you today for an acute visit.  A few things to remember from today's visit:   Dysuria - Plan: POC Urinalysis Dipstick, Urine culture  Costal margin pain  Urinary tract infection without hematuria, site unspecified - Plan: Urine culture   Medications prescribed today are intended for short period of time and will not be refill upon request, a follow up appointment might be necessary to discuss continuation of of treatment if appropriate.   Adequate fluid intake, avoid holding urine for long hours, and over the counter Vit C OR cranberry capsules might help.  Today we will treat empirically with antibiotic, which we might need to change when urine culture comes back depending of bacteria susceptibility.  Seek immediate medical attention if severe abdominal pain, vomiting, fever/chills, or worsening symptoms. F/U if symptomatic are not any better after 2-3 days of antibiotic treatment.  For side pain over-the-counter icy hot or Aspercreme with lidocaine might help.  Continue monitoring and follow with your PCP if pain is persistent in 3-4 weeks.  In general please monitor for signs of worsening symptoms and seek immediate medical attention if any concerning.  .  I hope you get better soon!

## 2017-07-27 LAB — URINE CULTURE
MICRO NUMBER: 90312724
SPECIMEN QUALITY: ADEQUATE

## 2017-07-29 ENCOUNTER — Encounter: Payer: Self-pay | Admitting: Family Medicine

## 2017-09-08 ENCOUNTER — Encounter: Payer: Self-pay | Admitting: Internal Medicine

## 2017-09-09 NOTE — Telephone Encounter (Signed)
Persons born before [birth year] are presumed immune    ( before  Vaccine)  But  If you want to get a second mmr  You can   Or alternatively blood tests  To check immunity  Has there been a risk problem ?

## 2017-09-12 NOTE — Telephone Encounter (Signed)
You are assumed to be immune cause of age  But it doesn't hurt  You to get a mmr if you are to travel to  High risk cities  And areas of the [place] where there are outbreaks  

## 2017-10-18 DIAGNOSIS — J3081 Allergic rhinitis due to animal (cat) (dog) hair and dander: Secondary | ICD-10-CM | POA: Diagnosis not present

## 2017-10-18 DIAGNOSIS — J3089 Other allergic rhinitis: Secondary | ICD-10-CM | POA: Diagnosis not present

## 2017-10-18 DIAGNOSIS — J301 Allergic rhinitis due to pollen: Secondary | ICD-10-CM | POA: Diagnosis not present

## 2017-10-31 DIAGNOSIS — J301 Allergic rhinitis due to pollen: Secondary | ICD-10-CM | POA: Diagnosis not present

## 2017-10-31 DIAGNOSIS — J3081 Allergic rhinitis due to animal (cat) (dog) hair and dander: Secondary | ICD-10-CM | POA: Diagnosis not present

## 2017-10-31 DIAGNOSIS — J3089 Other allergic rhinitis: Secondary | ICD-10-CM | POA: Diagnosis not present

## 2017-11-14 DIAGNOSIS — J301 Allergic rhinitis due to pollen: Secondary | ICD-10-CM | POA: Diagnosis not present

## 2017-11-14 DIAGNOSIS — J3089 Other allergic rhinitis: Secondary | ICD-10-CM | POA: Diagnosis not present

## 2017-11-14 DIAGNOSIS — J3081 Allergic rhinitis due to animal (cat) (dog) hair and dander: Secondary | ICD-10-CM | POA: Diagnosis not present

## 2017-11-29 DIAGNOSIS — J3081 Allergic rhinitis due to animal (cat) (dog) hair and dander: Secondary | ICD-10-CM | POA: Diagnosis not present

## 2017-11-29 DIAGNOSIS — J3089 Other allergic rhinitis: Secondary | ICD-10-CM | POA: Diagnosis not present

## 2017-11-29 DIAGNOSIS — J301 Allergic rhinitis due to pollen: Secondary | ICD-10-CM | POA: Diagnosis not present

## 2017-12-06 DIAGNOSIS — J3081 Allergic rhinitis due to animal (cat) (dog) hair and dander: Secondary | ICD-10-CM | POA: Diagnosis not present

## 2017-12-06 DIAGNOSIS — J3089 Other allergic rhinitis: Secondary | ICD-10-CM | POA: Diagnosis not present

## 2017-12-06 DIAGNOSIS — J301 Allergic rhinitis due to pollen: Secondary | ICD-10-CM | POA: Diagnosis not present

## 2017-12-22 DIAGNOSIS — J301 Allergic rhinitis due to pollen: Secondary | ICD-10-CM | POA: Diagnosis not present

## 2017-12-22 DIAGNOSIS — J3081 Allergic rhinitis due to animal (cat) (dog) hair and dander: Secondary | ICD-10-CM | POA: Diagnosis not present

## 2017-12-22 DIAGNOSIS — J3089 Other allergic rhinitis: Secondary | ICD-10-CM | POA: Diagnosis not present

## 2017-12-27 DIAGNOSIS — J3089 Other allergic rhinitis: Secondary | ICD-10-CM | POA: Diagnosis not present

## 2017-12-27 DIAGNOSIS — J301 Allergic rhinitis due to pollen: Secondary | ICD-10-CM | POA: Diagnosis not present

## 2017-12-27 DIAGNOSIS — J3081 Allergic rhinitis due to animal (cat) (dog) hair and dander: Secondary | ICD-10-CM | POA: Diagnosis not present

## 2018-01-02 DIAGNOSIS — R69 Illness, unspecified: Secondary | ICD-10-CM | POA: Diagnosis not present

## 2018-01-09 DIAGNOSIS — J301 Allergic rhinitis due to pollen: Secondary | ICD-10-CM | POA: Diagnosis not present

## 2018-01-09 DIAGNOSIS — J3089 Other allergic rhinitis: Secondary | ICD-10-CM | POA: Diagnosis not present

## 2018-01-09 DIAGNOSIS — J3081 Allergic rhinitis due to animal (cat) (dog) hair and dander: Secondary | ICD-10-CM | POA: Diagnosis not present

## 2018-01-13 DIAGNOSIS — J3089 Other allergic rhinitis: Secondary | ICD-10-CM | POA: Diagnosis not present

## 2018-01-13 DIAGNOSIS — J3081 Allergic rhinitis due to animal (cat) (dog) hair and dander: Secondary | ICD-10-CM | POA: Diagnosis not present

## 2018-01-13 DIAGNOSIS — J301 Allergic rhinitis due to pollen: Secondary | ICD-10-CM | POA: Diagnosis not present

## 2018-01-17 DIAGNOSIS — J301 Allergic rhinitis due to pollen: Secondary | ICD-10-CM | POA: Diagnosis not present

## 2018-01-17 DIAGNOSIS — J3089 Other allergic rhinitis: Secondary | ICD-10-CM | POA: Diagnosis not present

## 2018-01-17 DIAGNOSIS — J3081 Allergic rhinitis due to animal (cat) (dog) hair and dander: Secondary | ICD-10-CM | POA: Diagnosis not present

## 2018-01-26 DIAGNOSIS — H524 Presbyopia: Secondary | ICD-10-CM | POA: Diagnosis not present

## 2018-01-26 DIAGNOSIS — H5201 Hypermetropia, right eye: Secondary | ICD-10-CM | POA: Diagnosis not present

## 2018-01-26 DIAGNOSIS — H52202 Unspecified astigmatism, left eye: Secondary | ICD-10-CM | POA: Diagnosis not present

## 2018-02-01 DIAGNOSIS — J3089 Other allergic rhinitis: Secondary | ICD-10-CM | POA: Diagnosis not present

## 2018-02-01 DIAGNOSIS — J3081 Allergic rhinitis due to animal (cat) (dog) hair and dander: Secondary | ICD-10-CM | POA: Diagnosis not present

## 2018-02-01 DIAGNOSIS — J301 Allergic rhinitis due to pollen: Secondary | ICD-10-CM | POA: Diagnosis not present

## 2018-02-07 DIAGNOSIS — J301 Allergic rhinitis due to pollen: Secondary | ICD-10-CM | POA: Diagnosis not present

## 2018-02-07 DIAGNOSIS — J3089 Other allergic rhinitis: Secondary | ICD-10-CM | POA: Diagnosis not present

## 2018-02-07 DIAGNOSIS — J3081 Allergic rhinitis due to animal (cat) (dog) hair and dander: Secondary | ICD-10-CM | POA: Diagnosis not present

## 2018-02-14 DIAGNOSIS — D2271 Melanocytic nevi of right lower limb, including hip: Secondary | ICD-10-CM | POA: Diagnosis not present

## 2018-02-14 DIAGNOSIS — D2272 Melanocytic nevi of left lower limb, including hip: Secondary | ICD-10-CM | POA: Diagnosis not present

## 2018-02-14 DIAGNOSIS — L821 Other seborrheic keratosis: Secondary | ICD-10-CM | POA: Diagnosis not present

## 2018-02-14 DIAGNOSIS — L820 Inflamed seborrheic keratosis: Secondary | ICD-10-CM | POA: Diagnosis not present

## 2018-02-14 DIAGNOSIS — D485 Neoplasm of uncertain behavior of skin: Secondary | ICD-10-CM | POA: Diagnosis not present

## 2018-02-14 DIAGNOSIS — L603 Nail dystrophy: Secondary | ICD-10-CM | POA: Diagnosis not present

## 2018-02-14 DIAGNOSIS — D2261 Melanocytic nevi of right upper limb, including shoulder: Secondary | ICD-10-CM | POA: Diagnosis not present

## 2018-02-14 DIAGNOSIS — D2262 Melanocytic nevi of left upper limb, including shoulder: Secondary | ICD-10-CM | POA: Diagnosis not present

## 2018-02-14 DIAGNOSIS — L570 Actinic keratosis: Secondary | ICD-10-CM | POA: Diagnosis not present

## 2018-02-16 DIAGNOSIS — J3081 Allergic rhinitis due to animal (cat) (dog) hair and dander: Secondary | ICD-10-CM | POA: Diagnosis not present

## 2018-02-16 DIAGNOSIS — J3089 Other allergic rhinitis: Secondary | ICD-10-CM | POA: Diagnosis not present

## 2018-02-16 DIAGNOSIS — J301 Allergic rhinitis due to pollen: Secondary | ICD-10-CM | POA: Diagnosis not present

## 2018-03-03 DIAGNOSIS — J3089 Other allergic rhinitis: Secondary | ICD-10-CM | POA: Diagnosis not present

## 2018-03-03 DIAGNOSIS — J3081 Allergic rhinitis due to animal (cat) (dog) hair and dander: Secondary | ICD-10-CM | POA: Diagnosis not present

## 2018-03-03 DIAGNOSIS — J301 Allergic rhinitis due to pollen: Secondary | ICD-10-CM | POA: Diagnosis not present

## 2018-03-06 DIAGNOSIS — J3089 Other allergic rhinitis: Secondary | ICD-10-CM | POA: Diagnosis not present

## 2018-03-06 DIAGNOSIS — J3081 Allergic rhinitis due to animal (cat) (dog) hair and dander: Secondary | ICD-10-CM | POA: Diagnosis not present

## 2018-03-06 DIAGNOSIS — J301 Allergic rhinitis due to pollen: Secondary | ICD-10-CM | POA: Diagnosis not present

## 2018-03-09 ENCOUNTER — Ambulatory Visit (INDEPENDENT_AMBULATORY_CARE_PROVIDER_SITE_OTHER): Payer: Medicare HMO | Admitting: *Deleted

## 2018-03-09 DIAGNOSIS — Z23 Encounter for immunization: Secondary | ICD-10-CM | POA: Diagnosis not present

## 2018-03-13 DIAGNOSIS — J3089 Other allergic rhinitis: Secondary | ICD-10-CM | POA: Diagnosis not present

## 2018-03-13 DIAGNOSIS — J3081 Allergic rhinitis due to animal (cat) (dog) hair and dander: Secondary | ICD-10-CM | POA: Diagnosis not present

## 2018-03-13 DIAGNOSIS — J301 Allergic rhinitis due to pollen: Secondary | ICD-10-CM | POA: Diagnosis not present

## 2018-03-20 DIAGNOSIS — J3081 Allergic rhinitis due to animal (cat) (dog) hair and dander: Secondary | ICD-10-CM | POA: Diagnosis not present

## 2018-03-20 DIAGNOSIS — J3089 Other allergic rhinitis: Secondary | ICD-10-CM | POA: Diagnosis not present

## 2018-03-20 DIAGNOSIS — J301 Allergic rhinitis due to pollen: Secondary | ICD-10-CM | POA: Diagnosis not present

## 2018-03-27 DIAGNOSIS — J3089 Other allergic rhinitis: Secondary | ICD-10-CM | POA: Diagnosis not present

## 2018-03-27 DIAGNOSIS — J3081 Allergic rhinitis due to animal (cat) (dog) hair and dander: Secondary | ICD-10-CM | POA: Diagnosis not present

## 2018-03-27 DIAGNOSIS — J301 Allergic rhinitis due to pollen: Secondary | ICD-10-CM | POA: Diagnosis not present

## 2018-04-03 NOTE — Progress Notes (Signed)
Chief Complaint  Patient presents with  . Annual Exam    cold symptoms x 2 days - congestiion, PND, sore throat and cough. Pt also having some fatigue. Denies fever, chills and body aches    HPI: Kimberly Schroeder 66 y.o. comes in today for  Welcome to Preventive Medicare exam/ wellness visit .Since last visit.  Doing ok but has 1-2 days of cold  resp sx  No fever .  husband with same.  Sees gyne  Derm and gi and utd so far.  She is listed as diabetic and has not been  Had  A sugar borderline  At 100 range .  Has had dexa per [facility] Maintenance  Topic Date Due  . OPHTHALMOLOGY EXAM  11/09/1960  . URINE MICROALBUMIN  11/09/1962  . MAMMOGRAM  07/15/2016  . PAP SMEAR  07/15/2017  . HEMOGLOBIN A1C  09/26/2017  . DEXA SCAN  11/08/2017  . PNA vac Low Risk Adult (2 of 2 - PPSV23) 04/05/2019  . COLONOSCOPY  07/08/2021  . TETANUS/TDAP  03/30/2027  . INFLUENZA VACCINE  Completed   Health Maintenance Review LIFESTYLE:  Exercise:     gyme 3 x per week  Tobacco/ETS:   no Alcohol:   ocass Sugar beverages:  ocass sweet tea  Sleep: about  8 hours  Drug use: no HH:  2 1 dog    Hearing:  Ok  Disc    No need for formal testing   Vision:  No limitations at present . Glasses   See eye doc   Safety:  Has smoke detector and wears seat belts.  No firearms. No excess sun exposure. Sees dentist regularly.  Falls: n  Advance directive :  Reviewed  Has one.  Memory: Felt to be good  , no concern from her or her family.  Depression: No anhedonia unusual crying or depressive symptoms  Nutrition: Eats well balanced diet; adequate calcium and vitamin D. No swallowing chewing problems.  Other healthcare providers:  Reviewed today .  Social:  Lives with spouse married.   Preventive parameters: up-to-date  Reviewed   ADLS:   There are no problems or need for assistance  driving, feeding, obtaining food, dressing, toileting and bathing, managing money using phone. She is  independent.   ROS:  See hpi    GEN/ HEENT: No fever, significant weight changes sweats headaches vision problems hearing changes, CV/ PULM; No chest pain shortness of breath cough, syncope,edema  change in exercise tolerance. GI /GU: No adominal pain, vomiting, change in bowel habits. No blood in the stool. No significant GU symptoms. SKIN/HEME: ,no acute skin rashes suspicious lesions or bleeding. No lymphadenopathy, nodules, masses.  NEURO/ PSYCH:  No neurologic signs such as weakness numbness. No depression anxiety. IMM/ Allergy: No unusual infections.  Allergy  Per dr Donneta Romberg .   REST of 12 system review negative except as per HPI   Past Medical History:  Diagnosis Date  . Allergic rhinitis    allergist evaluation spring fall and dust.  . Ectopic pregnancy    x 2  . GERD (gastroesophageal reflux disease)   . Hematuria, microscopic    eval 2001  Dr Terance Hart    Family History  Problem Relation Age of Onset  . Colon cancer Father 58  . Colon cancer Paternal Uncle        54's  . Parkinsonism Unknown   . Arthritis Unknown   . Hypertension Unknown     Social History  Socioeconomic History  . Marital status: Married    Spouse name: Not on file  . Number of children: Not on file  . Years of education: Not on file  . Highest education level: Not on file  Occupational History  . Not on file  Social Needs  . Financial resource strain: Not on file  . Food insecurity:    Worry: Not on file    Inability: Not on file  . Transportation needs:    Medical: Not on file    Non-medical: Not on file  Tobacco Use  . Smoking status: Never Smoker  . Smokeless tobacco: Never Used  Substance and Sexual Activity  . Alcohol use: Yes    Comment: occasional wine  . Drug use: No  . Sexual activity: Not on file  Lifestyle  . Physical activity:    Days per week: Not on file    Minutes per session: Not on file  . Stress: Not on file  Relationships  . Social connections:    Talks  on phone: Not on file    Gets together: Not on file    Attends religious service: Not on file    Active member of club or organization: Not on file    Attends meetings of clubs or organizations: Not on file    Relationship status: Not on file  Other Topics Concern  . 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     Outpatient Encounter Medications as of 04/04/2018  Medication Sig  . Calcium-Vitamin D-Vitamin K (VIACTIV PO) Take by mouth. Reported on 10/14/2015  . cetirizine (ZYRTEC) 10 MG tablet Take 10 mg by mouth daily.    Marland Kitchen EPIPEN 2-PAK 0.3 MG/0.3ML SOAJ injection USE AS DIRECTED AS NEEDED  . Estradiol (VAGIFEM) 10 MCG TABS Place vaginally.    . fish oil-omega-3 fatty acids 1000 MG capsule Take 2 g by mouth daily.    . Multiple Vitamins-Minerals (MULTIVITAMIN WITH MINERALS) tablet Take 1 tablet by mouth daily.    . NON FORMULARY Allergy shots   . psyllium (METAMUCIL) 58.6 % packet Take 1 packet by mouth daily.    . [DISCONTINUED] amoxicillin-clavulanate (AUGMENTIN) 875-125 MG tablet Take 1 tablet by mouth every 12 (twelve) hours. (Patient not taking: Reported on 07/26/2017)   Facility-Administered Encounter Medications as of 04/04/2018  Medication  . 0.9 %  sodium chloride infusion    EXAM:  BP 118/84 (BP Location: Left Arm, Patient Position: Sitting, Cuff Size: Normal)   Pulse 84   Temp 98 F (36.7 C) (Oral)   Ht 5' 1.02" (1.55 m)   Wt 155 lb 11.2 oz (70.6 kg)   BMI 29.40 kg/m   Body mass index is 29.4 kg/m.  Physical Exam: Vital signs reviewed SLH:TDSK is a well-developed well-nourished alert cooperative   who appears stated age in no acute distress.  Mild congestion HEENT: normocephalic atraumatic , Eyes: PERRL  EOM's full, conjunctiva clear, Nares: paten,t no deformity discharge or tenderness., Ears: no deformity EAC's clear TMs with normal landmarks. Mouth: clear OP, no lesions, edema.  Moist mucous membranes. Dentition in adequate repair. NECK: supple without masses, thyromegaly or bruits. CHEST/PULM:  Clear to auscultation and percussion breath sounds equal no wheeze , rales or rhonchi. No chest wall deformities or tenderness. CV: PMI is nondisplaced, S1 S2 no gallops, murmurs, rubs. Peripheral pulses are full without delay.No JVD .  ABDOMEN: Bowel sounds normal nontender  No guard or rebound, no hepato splenomegal no CVA tenderness.   Extremtities:  No clubbing cyanosis or edema, no acute joint swelling or redness no focal atrophy NEURO:  Oriented x3, cranial nerves 3-12 appear to be intact, no obvious focal weakness,gait within normal limits no abnormal reflexes or asymmetrical SKIN: No acute rashes normal turgor, color, no bruising or petechiae. PSYCH: Oriented, good eye contact, no obvious depression anxiety, cognition and judgment appear normal. LN: no cervical axillary inguinal adenopathy No noted deficits in memory, attention, and speech.     ASSESSMENT AND PLAN:  Discussed the following assessment and plan:  Welcome to Medicare preventive visit  Visit for preventive health examination - Plan: Hemoglobin H4T, Basic metabolic panel, Lipid panel  Medication management - Plan: Hemoglobin M5Y, Basic metabolic panel, Lipid panel  Screening, lipid - Plan: Hemoglobin Y5K, Basic metabolic panel, Lipid panel  Elevated blood sugar - Plan: Hemoglobin P5W, Basic metabolic panel, Lipid panel  Need for pneumococcal vaccination - Plan: Pneumococcal conjugate vaccine 13-valent  Elevated blood sugar level  URI, acute - seems viral uncomplcated  She is not diabetic   ??   How to fix this plan send a ticket   Will check a1c today  With fasting  Fu as appropriate or yearly    [facility] for prevnar 13  today    On list for shingrix  Patient Care Team: Hayven Fatima, Standley Brooking, MD as PCP - General Mosetta Anis, MD (Allergy) Dian Queen, MD (Obstetrics and Gynecology) Mauri Pole, MD as Consulting Physician (Gastroenterology) Martinique, Amy, MD as Consulting Physician (Dermatology)  Patient Instructions  Will send in a ticket to help correct the EHR  That you are not diabetic.   prenar 13 today   Pneumovax next year .   Will notify you  of labs when available.   Continue lifestyle intervention healthy eating and exercise .  Preventive Care 14 Years and Older, Female Preventive care refers to lifestyle choices and visits with your health care provider that can promote health and wellness. What does preventive care include?  A yearly physical exam. This is also called an annual well check.  Dental exams once or twice a year.  Routine eye exams. Ask your health care provider how often you should have your eyes checked.  Personal lifestyle choices, including: ? Daily care of your teeth and gums. ? Regular physical activity. ? Eating a healthy diet. ? Avoiding tobacco and drug use. ? Limiting alcohol use. ? Practicing safe sex. ? Taking low-dose aspirin every day. ? Taking vitamin and mineral supplements as recommended by your health care provider. What happens during an annual well check? The services and screenings done by your health care provider during your annual well check will depend on your age, overall health, lifestyle risk factors, and family history of disease. Counseling Your health care provider may ask you questions about your:  Alcohol use.  Tobacco use.  Drug use.  Emotional well-being.  Home and relationship well-being.  Sexual activity.  Eating habits.  History of falls.  Memory and ability to understand (cognition).  Work and work Statistician.  Reproductive health.  Screening You may have the following tests or  measurements:  Height, weight, and BMI.  Blood  pressure.  Lipid and cholesterol levels. These may be checked every 5 years, or more frequently if you are over 48 years old.  Skin check.  Lung cancer screening. You may have this screening every year starting at age 46 if you have a 30-pack-year history of smoking and currently smoke or have quit within the past 15 years.  Fecal occult blood test (FOBT) of the stool. You may have this test every year starting at age 56.  Flexible sigmoidoscopy or colonoscopy. You may have a sigmoidoscopy every 5 years or a colonoscopy every 10 years starting at age 82.  Hepatitis C blood test.  Hepatitis B blood test.  Sexually transmitted disease (STD) testing.  Diabetes screening. This is done by checking your blood sugar (glucose) after you have not eaten for a while (fasting). You may have this done every 1-3 years.  Bone density scan. This is done to screen for osteoporosis. You may have this done starting at age 61.  Mammogram. This may be done every 1-2 years. Talk to your health care provider about how often you should have regular mammograms.  Talk with your health care provider about your test results, treatment options, and if necessary, the need for more tests. Vaccines Your health care provider may recommend certain vaccines, such as:  Influenza vaccine. This is recommended every year.  Tetanus, diphtheria, and acellular pertussis (Tdap, Td) vaccine. You may need a Td booster every 10 years.  Varicella vaccine. You may need this if you have not been vaccinated.  Zoster vaccine. You may need this after age 10.  Measles, mumps, and rubella (MMR) vaccine. You may need at least one dose of MMR if you were born in 1957 or later. You may also need a second dose.  Pneumococcal 13-valent conjugate (PCV13) vaccine. One dose is recommended after age 41.  Pneumococcal polysaccharide (PPSV23) vaccine. One dose is recommended after age  72.  Meningococcal vaccine. You may need this if you have certain conditions.  Hepatitis A vaccine. You may need this if you have certain conditions or if you travel or work in places where you may be exposed to hepatitis A.  Hepatitis B vaccine. You may need this if you have certain conditions or if you travel or work in places where you may be exposed to hepatitis B.  Haemophilus influenzae type b (Hib) vaccine. You may need this if you have certain conditions.  Talk to your health care provider about which screenings and vaccines you need and how often you need them. This information is not intended to replace advice given to you by your health care provider. Make sure you discuss any questions you have with your health care provider. Document Released: 05/30/2015 Document Revised: 01/21/2016 Document Reviewed: 03/04/2015 Elsevier Interactive Patient Education  2018 [place]. Yousof Alderman M.D.

## 2018-04-04 ENCOUNTER — Ambulatory Visit (INDEPENDENT_AMBULATORY_CARE_PROVIDER_SITE_OTHER): Payer: Medicare HMO | Admitting: Internal Medicine

## 2018-04-04 ENCOUNTER — Encounter: Payer: Self-pay | Admitting: Internal Medicine

## 2018-04-04 VITALS — BP 118/84 | HR 84 | Temp 98.0°F | Ht 61.02 in | Wt 155.7 lb

## 2018-04-04 DIAGNOSIS — Z79899 Other long term (current) drug therapy: Secondary | ICD-10-CM

## 2018-04-04 DIAGNOSIS — Z23 Encounter for immunization: Secondary | ICD-10-CM

## 2018-04-04 DIAGNOSIS — J069 Acute upper respiratory infection, unspecified: Secondary | ICD-10-CM

## 2018-04-04 DIAGNOSIS — R739 Hyperglycemia, unspecified: Secondary | ICD-10-CM

## 2018-04-04 DIAGNOSIS — Z0000 Encounter for general adult medical examination without abnormal findings: Secondary | ICD-10-CM

## 2018-04-04 DIAGNOSIS — Z13220 Encounter for screening for lipoid disorders: Secondary | ICD-10-CM | POA: Diagnosis not present

## 2018-04-04 LAB — LIPID PANEL
CHOLESTEROL: 194 mg/dL (ref 0–200)
HDL: 62 mg/dL (ref >39.00)
LDL CALC: 109 mg/dL — AB (ref 0–99)
NonHDL: 131.74
Total CHOL/HDL Ratio: 3
Triglycerides: 114 mg/dL (ref 0.0–149.0)
VLDL: 22.8 mg/dL (ref 0.0–40.0)

## 2018-04-04 LAB — HEMOGLOBIN A1C: Hgb A1c MFr Bld: 5.6 % (ref 4.6–6.5)

## 2018-04-04 LAB — BASIC METABOLIC PANEL
BUN: 18 mg/dL (ref 6–23)
CO2: 28 mEq/L (ref 19–32)
Calcium: 9.6 mg/dL (ref 8.4–10.5)
Chloride: 107 mEq/L (ref 96–112)
Creatinine, Ser: 0.91 mg/dL (ref 0.40–1.20)
GFR: 65.86 mL/min (ref >60.00)
Glucose, Bld: 104 mg/dL — ABNORMAL HIGH (ref 70–99)
POTASSIUM: 4.8 meq/L (ref 3.5–5.1)
SODIUM: 141 meq/L (ref 135–145)

## 2018-04-04 NOTE — Patient Instructions (Addendum)
Will send in a ticket to help correct the EHR  That you are not diabetic.   prenar 13 today   Pneumovax next year .   Will notify you  of labs when available.   Continue lifestyle intervention healthy eating and exercise .  Preventive Care 32 Years and Older, Female Preventive care refers to lifestyle choices and visits with your health care provider that can promote health and wellness. What does preventive care include?  A yearly physical exam. This is also called an annual well check.  Dental exams once or twice a year.  Routine eye exams. Ask your health care provider how often you should have your eyes checked.  Personal lifestyle choices, including: ? Daily care of your teeth and gums. ? Regular physical activity. ? Eating a healthy diet. ? Avoiding tobacco and drug use. ? Limiting alcohol use. ? Practicing safe sex. ? Taking low-dose aspirin every day. ? Taking vitamin and mineral supplements as recommended by your health care provider. What happens during an annual well check? The services and screenings done by your health care provider during your annual well check will depend on your age, overall health, lifestyle risk factors, and family history of disease. Counseling Your health care provider may ask you questions about your:  Alcohol use.  Tobacco use.  Drug use.  Emotional well-being.  Home and relationship well-being.  Sexual activity.  Eating habits.  History of falls.  Memory and ability to understand (cognition).  Work and work Statistician.  Reproductive health.  Screening You may have the following tests or measurements:  Height, weight, and BMI.  Blood pressure.  Lipid and cholesterol levels. These may be checked every 5 years, or more frequently if you are over 53 years old.  Skin check.  Lung cancer screening. You may have this screening every year starting at age 4 if you have a 30-pack-year history of smoking and currently  smoke or have quit within the past 15 years.  Fecal occult blood test (FOBT) of the stool. You may have this test every year starting at age 76.  Flexible sigmoidoscopy or colonoscopy. You may have a sigmoidoscopy every 5 years or a colonoscopy every 10 years starting at age 43.  Hepatitis C blood test.  Hepatitis B blood test.  Sexually transmitted disease (STD) testing.  Diabetes screening. This is done by checking your blood sugar (glucose) after you have not eaten for a while (fasting). You may have this done every 1-3 years.  Bone density scan. This is done to screen for osteoporosis. You may have this done starting at age 69.  Mammogram. This may be done every 1-2 years. Talk to your health care provider about how often you should have regular mammograms.  Talk with your health care provider about your test results, treatment options, and if necessary, the need for more tests. Vaccines Your health care provider may recommend certain vaccines, such as:  Influenza vaccine. This is recommended every year.  Tetanus, diphtheria, and acellular pertussis (Tdap, Td) vaccine. You may need a Td booster every 10 years.  Varicella vaccine. You may need this if you have not been vaccinated.  Zoster vaccine. You may need this after age 79.  Measles, mumps, and rubella (MMR) vaccine. You may need at least one dose of MMR if you were born in 1957 or later. You may also need a second dose.  Pneumococcal 13-valent conjugate (PCV13) vaccine. One dose is recommended after age 73.  Pneumococcal polysaccharide (PPSV23)  vaccine. One dose is recommended after age 55.  Meningococcal vaccine. You may need this if you have certain conditions.  Hepatitis A vaccine. You may need this if you have certain conditions or if you travel or work in places where you may be exposed to hepatitis A.  Hepatitis B vaccine. You may need this if you have certain conditions or if you travel or work in places where  you may be exposed to hepatitis B.  Haemophilus influenzae type b (Hib) vaccine. You may need this if you have certain conditions.  Talk to your health care provider about which screenings and vaccines you need and how often you need them. This information is not intended to replace advice given to you by your health care provider. Make sure you discuss any questions you have with your health care provider. Document Released: 05/30/2015 Document Revised: 01/21/2016 Document Reviewed: 03/04/2015 Elsevier Interactive Patient Education  [employer].

## 2018-04-07 DIAGNOSIS — J3089 Other allergic rhinitis: Secondary | ICD-10-CM | POA: Diagnosis not present

## 2018-04-07 DIAGNOSIS — J3081 Allergic rhinitis due to animal (cat) (dog) hair and dander: Secondary | ICD-10-CM | POA: Diagnosis not present

## 2018-04-07 DIAGNOSIS — J301 Allergic rhinitis due to pollen: Secondary | ICD-10-CM | POA: Diagnosis not present

## 2018-04-18 DIAGNOSIS — J3081 Allergic rhinitis due to animal (cat) (dog) hair and dander: Secondary | ICD-10-CM | POA: Diagnosis not present

## 2018-04-18 DIAGNOSIS — J3089 Other allergic rhinitis: Secondary | ICD-10-CM | POA: Diagnosis not present

## 2018-04-18 DIAGNOSIS — J301 Allergic rhinitis due to pollen: Secondary | ICD-10-CM | POA: Diagnosis not present

## 2018-04-24 DIAGNOSIS — J301 Allergic rhinitis due to pollen: Secondary | ICD-10-CM | POA: Diagnosis not present

## 2018-04-24 DIAGNOSIS — J3089 Other allergic rhinitis: Secondary | ICD-10-CM | POA: Diagnosis not present

## 2018-04-24 DIAGNOSIS — J3081 Allergic rhinitis due to animal (cat) (dog) hair and dander: Secondary | ICD-10-CM | POA: Diagnosis not present

## 2018-05-05 DIAGNOSIS — J3089 Other allergic rhinitis: Secondary | ICD-10-CM | POA: Diagnosis not present

## 2018-05-05 DIAGNOSIS — J3081 Allergic rhinitis due to animal (cat) (dog) hair and dander: Secondary | ICD-10-CM | POA: Diagnosis not present

## 2018-05-05 DIAGNOSIS — J301 Allergic rhinitis due to pollen: Secondary | ICD-10-CM | POA: Diagnosis not present

## 2018-05-15 DIAGNOSIS — J3081 Allergic rhinitis due to animal (cat) (dog) hair and dander: Secondary | ICD-10-CM | POA: Diagnosis not present

## 2018-05-15 DIAGNOSIS — J301 Allergic rhinitis due to pollen: Secondary | ICD-10-CM | POA: Diagnosis not present

## 2018-05-15 DIAGNOSIS — J3089 Other allergic rhinitis: Secondary | ICD-10-CM | POA: Diagnosis not present

## 2018-05-29 DIAGNOSIS — J3089 Other allergic rhinitis: Secondary | ICD-10-CM | POA: Diagnosis not present

## 2018-05-29 DIAGNOSIS — J301 Allergic rhinitis due to pollen: Secondary | ICD-10-CM | POA: Diagnosis not present

## 2018-05-29 DIAGNOSIS — J3081 Allergic rhinitis due to animal (cat) (dog) hair and dander: Secondary | ICD-10-CM | POA: Diagnosis not present

## 2018-06-15 DIAGNOSIS — J3089 Other allergic rhinitis: Secondary | ICD-10-CM | POA: Diagnosis not present

## 2018-06-15 DIAGNOSIS — J3081 Allergic rhinitis due to animal (cat) (dog) hair and dander: Secondary | ICD-10-CM | POA: Diagnosis not present

## 2018-06-15 DIAGNOSIS — J301 Allergic rhinitis due to pollen: Secondary | ICD-10-CM | POA: Diagnosis not present

## 2018-07-03 DIAGNOSIS — J3089 Other allergic rhinitis: Secondary | ICD-10-CM | POA: Diagnosis not present

## 2018-07-03 DIAGNOSIS — J301 Allergic rhinitis due to pollen: Secondary | ICD-10-CM | POA: Diagnosis not present

## 2018-07-03 DIAGNOSIS — J3081 Allergic rhinitis due to animal (cat) (dog) hair and dander: Secondary | ICD-10-CM | POA: Diagnosis not present

## 2018-07-05 DIAGNOSIS — R69 Illness, unspecified: Secondary | ICD-10-CM | POA: Diagnosis not present

## 2018-07-06 DIAGNOSIS — R69 Illness, unspecified: Secondary | ICD-10-CM | POA: Diagnosis not present

## 2018-07-17 DIAGNOSIS — J3081 Allergic rhinitis due to animal (cat) (dog) hair and dander: Secondary | ICD-10-CM | POA: Diagnosis not present

## 2018-07-17 DIAGNOSIS — J301 Allergic rhinitis due to pollen: Secondary | ICD-10-CM | POA: Diagnosis not present

## 2018-07-17 DIAGNOSIS — J3089 Other allergic rhinitis: Secondary | ICD-10-CM | POA: Diagnosis not present

## 2018-07-27 DIAGNOSIS — J3081 Allergic rhinitis due to animal (cat) (dog) hair and dander: Secondary | ICD-10-CM | POA: Diagnosis not present

## 2018-07-27 DIAGNOSIS — J301 Allergic rhinitis due to pollen: Secondary | ICD-10-CM | POA: Diagnosis not present

## 2018-07-27 DIAGNOSIS — J3089 Other allergic rhinitis: Secondary | ICD-10-CM | POA: Diagnosis not present

## 2018-08-23 DIAGNOSIS — N958 Other specified menopausal and perimenopausal disorders: Secondary | ICD-10-CM | POA: Diagnosis not present

## 2018-08-23 DIAGNOSIS — N952 Postmenopausal atrophic vaginitis: Secondary | ICD-10-CM | POA: Diagnosis not present

## 2018-08-23 DIAGNOSIS — Z1231 Encounter for screening mammogram for malignant neoplasm of breast: Secondary | ICD-10-CM | POA: Diagnosis not present

## 2018-08-23 DIAGNOSIS — Z01419 Encounter for gynecological examination (general) (routine) without abnormal findings: Secondary | ICD-10-CM | POA: Diagnosis not present

## 2018-08-23 DIAGNOSIS — M8588 Other specified disorders of bone density and structure, other site: Secondary | ICD-10-CM | POA: Diagnosis not present

## 2018-08-23 DIAGNOSIS — Z6828 Body mass index (BMI) 28.0-28.9, adult: Secondary | ICD-10-CM | POA: Diagnosis not present

## 2018-09-05 DIAGNOSIS — J301 Allergic rhinitis due to pollen: Secondary | ICD-10-CM | POA: Diagnosis not present

## 2018-09-05 DIAGNOSIS — J3089 Other allergic rhinitis: Secondary | ICD-10-CM | POA: Diagnosis not present

## 2018-09-05 DIAGNOSIS — J3081 Allergic rhinitis due to animal (cat) (dog) hair and dander: Secondary | ICD-10-CM | POA: Diagnosis not present

## 2018-09-11 DIAGNOSIS — J301 Allergic rhinitis due to pollen: Secondary | ICD-10-CM | POA: Diagnosis not present

## 2018-09-11 DIAGNOSIS — J3089 Other allergic rhinitis: Secondary | ICD-10-CM | POA: Diagnosis not present

## 2018-09-11 DIAGNOSIS — J3081 Allergic rhinitis due to animal (cat) (dog) hair and dander: Secondary | ICD-10-CM | POA: Diagnosis not present

## 2018-09-20 DIAGNOSIS — J301 Allergic rhinitis due to pollen: Secondary | ICD-10-CM | POA: Diagnosis not present

## 2018-09-20 DIAGNOSIS — J3081 Allergic rhinitis due to animal (cat) (dog) hair and dander: Secondary | ICD-10-CM | POA: Diagnosis not present

## 2018-09-20 DIAGNOSIS — J3089 Other allergic rhinitis: Secondary | ICD-10-CM | POA: Diagnosis not present

## 2018-09-26 DIAGNOSIS — J301 Allergic rhinitis due to pollen: Secondary | ICD-10-CM | POA: Diagnosis not present

## 2018-09-26 DIAGNOSIS — J3089 Other allergic rhinitis: Secondary | ICD-10-CM | POA: Diagnosis not present

## 2018-09-26 DIAGNOSIS — J3081 Allergic rhinitis due to animal (cat) (dog) hair and dander: Secondary | ICD-10-CM | POA: Diagnosis not present

## 2018-10-10 DIAGNOSIS — J3081 Allergic rhinitis due to animal (cat) (dog) hair and dander: Secondary | ICD-10-CM | POA: Diagnosis not present

## 2018-10-10 DIAGNOSIS — J301 Allergic rhinitis due to pollen: Secondary | ICD-10-CM | POA: Diagnosis not present

## 2018-10-10 DIAGNOSIS — J3089 Other allergic rhinitis: Secondary | ICD-10-CM | POA: Diagnosis not present

## 2018-10-24 DIAGNOSIS — J3089 Other allergic rhinitis: Secondary | ICD-10-CM | POA: Diagnosis not present

## 2018-10-24 DIAGNOSIS — J301 Allergic rhinitis due to pollen: Secondary | ICD-10-CM | POA: Diagnosis not present

## 2018-10-24 DIAGNOSIS — J3081 Allergic rhinitis due to animal (cat) (dog) hair and dander: Secondary | ICD-10-CM | POA: Diagnosis not present

## 2018-10-26 DIAGNOSIS — J3081 Allergic rhinitis due to animal (cat) (dog) hair and dander: Secondary | ICD-10-CM | POA: Diagnosis not present

## 2018-10-26 DIAGNOSIS — J3089 Other allergic rhinitis: Secondary | ICD-10-CM | POA: Diagnosis not present

## 2018-10-26 DIAGNOSIS — J301 Allergic rhinitis due to pollen: Secondary | ICD-10-CM | POA: Diagnosis not present

## 2018-11-02 DIAGNOSIS — J301 Allergic rhinitis due to pollen: Secondary | ICD-10-CM | POA: Diagnosis not present

## 2018-11-02 DIAGNOSIS — J3089 Other allergic rhinitis: Secondary | ICD-10-CM | POA: Diagnosis not present

## 2018-11-02 DIAGNOSIS — J3081 Allergic rhinitis due to animal (cat) (dog) hair and dander: Secondary | ICD-10-CM | POA: Diagnosis not present

## 2018-11-08 DIAGNOSIS — J301 Allergic rhinitis due to pollen: Secondary | ICD-10-CM | POA: Diagnosis not present

## 2018-11-08 DIAGNOSIS — J3089 Other allergic rhinitis: Secondary | ICD-10-CM | POA: Diagnosis not present

## 2018-11-08 DIAGNOSIS — J3081 Allergic rhinitis due to animal (cat) (dog) hair and dander: Secondary | ICD-10-CM | POA: Diagnosis not present

## 2018-11-21 DIAGNOSIS — J3081 Allergic rhinitis due to animal (cat) (dog) hair and dander: Secondary | ICD-10-CM | POA: Diagnosis not present

## 2018-11-21 DIAGNOSIS — J301 Allergic rhinitis due to pollen: Secondary | ICD-10-CM | POA: Diagnosis not present

## 2018-11-21 DIAGNOSIS — J3089 Other allergic rhinitis: Secondary | ICD-10-CM | POA: Diagnosis not present

## 2018-11-28 DIAGNOSIS — J3081 Allergic rhinitis due to animal (cat) (dog) hair and dander: Secondary | ICD-10-CM | POA: Diagnosis not present

## 2018-11-28 DIAGNOSIS — J301 Allergic rhinitis due to pollen: Secondary | ICD-10-CM | POA: Diagnosis not present

## 2018-11-28 DIAGNOSIS — J3089 Other allergic rhinitis: Secondary | ICD-10-CM | POA: Diagnosis not present

## 2018-12-05 DIAGNOSIS — J301 Allergic rhinitis due to pollen: Secondary | ICD-10-CM | POA: Diagnosis not present

## 2018-12-05 DIAGNOSIS — J3081 Allergic rhinitis due to animal (cat) (dog) hair and dander: Secondary | ICD-10-CM | POA: Diagnosis not present

## 2018-12-05 DIAGNOSIS — J3089 Other allergic rhinitis: Secondary | ICD-10-CM | POA: Diagnosis not present

## 2018-12-12 DIAGNOSIS — J3081 Allergic rhinitis due to animal (cat) (dog) hair and dander: Secondary | ICD-10-CM | POA: Diagnosis not present

## 2018-12-12 DIAGNOSIS — J3089 Other allergic rhinitis: Secondary | ICD-10-CM | POA: Diagnosis not present

## 2018-12-12 DIAGNOSIS — J301 Allergic rhinitis due to pollen: Secondary | ICD-10-CM | POA: Diagnosis not present

## 2018-12-19 DIAGNOSIS — J3089 Other allergic rhinitis: Secondary | ICD-10-CM | POA: Diagnosis not present

## 2018-12-19 DIAGNOSIS — J3081 Allergic rhinitis due to animal (cat) (dog) hair and dander: Secondary | ICD-10-CM | POA: Diagnosis not present

## 2018-12-19 DIAGNOSIS — J301 Allergic rhinitis due to pollen: Secondary | ICD-10-CM | POA: Diagnosis not present

## 2018-12-27 DIAGNOSIS — J301 Allergic rhinitis due to pollen: Secondary | ICD-10-CM | POA: Diagnosis not present

## 2018-12-27 DIAGNOSIS — J3081 Allergic rhinitis due to animal (cat) (dog) hair and dander: Secondary | ICD-10-CM | POA: Diagnosis not present

## 2018-12-27 DIAGNOSIS — J3089 Other allergic rhinitis: Secondary | ICD-10-CM | POA: Diagnosis not present

## 2019-01-01 DIAGNOSIS — J3089 Other allergic rhinitis: Secondary | ICD-10-CM | POA: Diagnosis not present

## 2019-01-01 DIAGNOSIS — J3081 Allergic rhinitis due to animal (cat) (dog) hair and dander: Secondary | ICD-10-CM | POA: Diagnosis not present

## 2019-01-01 DIAGNOSIS — J301 Allergic rhinitis due to pollen: Secondary | ICD-10-CM | POA: Diagnosis not present

## 2019-01-08 DIAGNOSIS — J3089 Other allergic rhinitis: Secondary | ICD-10-CM | POA: Diagnosis not present

## 2019-01-08 DIAGNOSIS — J301 Allergic rhinitis due to pollen: Secondary | ICD-10-CM | POA: Diagnosis not present

## 2019-01-08 DIAGNOSIS — J3081 Allergic rhinitis due to animal (cat) (dog) hair and dander: Secondary | ICD-10-CM | POA: Diagnosis not present

## 2019-01-11 DIAGNOSIS — R69 Illness, unspecified: Secondary | ICD-10-CM | POA: Diagnosis not present

## 2019-01-17 DIAGNOSIS — R69 Illness, unspecified: Secondary | ICD-10-CM | POA: Diagnosis not present

## 2019-01-23 DIAGNOSIS — J301 Allergic rhinitis due to pollen: Secondary | ICD-10-CM | POA: Diagnosis not present

## 2019-01-23 DIAGNOSIS — J3089 Other allergic rhinitis: Secondary | ICD-10-CM | POA: Diagnosis not present

## 2019-01-23 DIAGNOSIS — J3081 Allergic rhinitis due to animal (cat) (dog) hair and dander: Secondary | ICD-10-CM | POA: Diagnosis not present

## 2019-02-02 DIAGNOSIS — H52222 Regular astigmatism, left eye: Secondary | ICD-10-CM | POA: Diagnosis not present

## 2019-02-02 DIAGNOSIS — H5202 Hypermetropia, left eye: Secondary | ICD-10-CM | POA: Diagnosis not present

## 2019-02-02 DIAGNOSIS — H524 Presbyopia: Secondary | ICD-10-CM | POA: Diagnosis not present

## 2019-02-02 DIAGNOSIS — H5201 Hypermetropia, right eye: Secondary | ICD-10-CM | POA: Diagnosis not present

## 2019-02-07 DIAGNOSIS — J3089 Other allergic rhinitis: Secondary | ICD-10-CM | POA: Diagnosis not present

## 2019-02-07 DIAGNOSIS — J301 Allergic rhinitis due to pollen: Secondary | ICD-10-CM | POA: Diagnosis not present

## 2019-02-07 DIAGNOSIS — J3081 Allergic rhinitis due to animal (cat) (dog) hair and dander: Secondary | ICD-10-CM | POA: Diagnosis not present

## 2019-02-20 DIAGNOSIS — J3089 Other allergic rhinitis: Secondary | ICD-10-CM | POA: Diagnosis not present

## 2019-02-20 DIAGNOSIS — J301 Allergic rhinitis due to pollen: Secondary | ICD-10-CM | POA: Diagnosis not present

## 2019-02-20 DIAGNOSIS — J3081 Allergic rhinitis due to animal (cat) (dog) hair and dander: Secondary | ICD-10-CM | POA: Diagnosis not present

## 2019-03-07 DIAGNOSIS — J301 Allergic rhinitis due to pollen: Secondary | ICD-10-CM | POA: Diagnosis not present

## 2019-03-07 DIAGNOSIS — J3081 Allergic rhinitis due to animal (cat) (dog) hair and dander: Secondary | ICD-10-CM | POA: Diagnosis not present

## 2019-03-07 DIAGNOSIS — J3089 Other allergic rhinitis: Secondary | ICD-10-CM | POA: Diagnosis not present

## 2019-03-23 DIAGNOSIS — J3089 Other allergic rhinitis: Secondary | ICD-10-CM | POA: Diagnosis not present

## 2019-03-23 DIAGNOSIS — J301 Allergic rhinitis due to pollen: Secondary | ICD-10-CM | POA: Diagnosis not present

## 2019-03-23 DIAGNOSIS — J3081 Allergic rhinitis due to animal (cat) (dog) hair and dander: Secondary | ICD-10-CM | POA: Diagnosis not present

## 2019-03-26 DIAGNOSIS — Z0100 Encounter for examination of eyes and vision without abnormal findings: Secondary | ICD-10-CM | POA: Diagnosis not present

## 2019-04-02 DIAGNOSIS — R69 Illness, unspecified: Secondary | ICD-10-CM | POA: Diagnosis not present

## 2019-04-04 DIAGNOSIS — R69 Illness, unspecified: Secondary | ICD-10-CM | POA: Diagnosis not present

## 2019-04-09 DIAGNOSIS — J301 Allergic rhinitis due to pollen: Secondary | ICD-10-CM | POA: Diagnosis not present

## 2019-04-09 DIAGNOSIS — J3089 Other allergic rhinitis: Secondary | ICD-10-CM | POA: Diagnosis not present

## 2019-04-09 DIAGNOSIS — J3081 Allergic rhinitis due to animal (cat) (dog) hair and dander: Secondary | ICD-10-CM | POA: Diagnosis not present

## 2019-04-14 DIAGNOSIS — U071 COVID-19: Secondary | ICD-10-CM | POA: Diagnosis not present

## 2019-04-30 DIAGNOSIS — D2271 Melanocytic nevi of right lower limb, including hip: Secondary | ICD-10-CM | POA: Diagnosis not present

## 2019-04-30 DIAGNOSIS — D2272 Melanocytic nevi of left lower limb, including hip: Secondary | ICD-10-CM | POA: Diagnosis not present

## 2019-04-30 DIAGNOSIS — L821 Other seborrheic keratosis: Secondary | ICD-10-CM | POA: Diagnosis not present

## 2019-04-30 DIAGNOSIS — D2261 Melanocytic nevi of right upper limb, including shoulder: Secondary | ICD-10-CM | POA: Diagnosis not present

## 2019-04-30 DIAGNOSIS — D2262 Melanocytic nevi of left upper limb, including shoulder: Secondary | ICD-10-CM | POA: Diagnosis not present

## 2019-05-09 DIAGNOSIS — J3089 Other allergic rhinitis: Secondary | ICD-10-CM | POA: Diagnosis not present

## 2019-05-09 DIAGNOSIS — J3081 Allergic rhinitis due to animal (cat) (dog) hair and dander: Secondary | ICD-10-CM | POA: Diagnosis not present

## 2019-05-09 DIAGNOSIS — J301 Allergic rhinitis due to pollen: Secondary | ICD-10-CM | POA: Diagnosis not present

## 2019-05-23 DIAGNOSIS — J3081 Allergic rhinitis due to animal (cat) (dog) hair and dander: Secondary | ICD-10-CM | POA: Diagnosis not present

## 2019-05-23 DIAGNOSIS — J3089 Other allergic rhinitis: Secondary | ICD-10-CM | POA: Diagnosis not present

## 2019-05-23 DIAGNOSIS — J301 Allergic rhinitis due to pollen: Secondary | ICD-10-CM | POA: Diagnosis not present

## 2019-06-04 NOTE — Telephone Encounter (Signed)
This patient is not diabetic and I noted  That  a ticket was sent in last year to fix thisbut apparently didn't fix the problem  Please contact  IT or whomever  And have them fix this problem  She is not diabetic and dont send her messages that she needs   Diabetes related  Parameters done. And report to patient   Thanks

## 2019-06-05 DIAGNOSIS — J3089 Other allergic rhinitis: Secondary | ICD-10-CM | POA: Diagnosis not present

## 2019-06-05 DIAGNOSIS — J301 Allergic rhinitis due to pollen: Secondary | ICD-10-CM | POA: Diagnosis not present

## 2019-06-05 DIAGNOSIS — J3081 Allergic rhinitis due to animal (cat) (dog) hair and dander: Secondary | ICD-10-CM | POA: Diagnosis not present

## 2019-06-07 ENCOUNTER — Ambulatory Visit: Payer: Medicare HMO | Attending: Internal Medicine

## 2019-06-07 DIAGNOSIS — Z23 Encounter for immunization: Secondary | ICD-10-CM

## 2019-06-07 NOTE — Progress Notes (Signed)
   Covid-19 Vaccination Clinic  Name:  Kimberly Schroeder    MRN: [id number] DOB: May 24, 1950  06/07/2019  [name] was observed post Covid-19 immunization for 15 minutes without incidence. She was provided with Vaccine Information Sheet and instruction to access the V-Safe system.   [name] was instructed to call 911 with any severe reactions post vaccine: Marland Kitchen Difficulty breathing  . Swelling of your face and throat  . A fast heartbeat  . A bad rash all over your body  . Dizziness and weakness    Immunizations Administered    Name Date Dose VIS Date Route   Pfizer COVID-19 Vaccine 06/07/2019 10:46 AM 0.3 mL 04/27/2019 Intramuscular   Manufacturer: [place]   Lot: [id number]   [place]: [id number]

## 2019-06-13 DIAGNOSIS — J301 Allergic rhinitis due to pollen: Secondary | ICD-10-CM | POA: Diagnosis not present

## 2019-06-13 DIAGNOSIS — J3081 Allergic rhinitis due to animal (cat) (dog) hair and dander: Secondary | ICD-10-CM | POA: Diagnosis not present

## 2019-06-13 DIAGNOSIS — J3089 Other allergic rhinitis: Secondary | ICD-10-CM | POA: Diagnosis not present

## 2019-06-19 DIAGNOSIS — J301 Allergic rhinitis due to pollen: Secondary | ICD-10-CM | POA: Diagnosis not present

## 2019-06-19 DIAGNOSIS — J3089 Other allergic rhinitis: Secondary | ICD-10-CM | POA: Diagnosis not present

## 2019-06-19 DIAGNOSIS — J3081 Allergic rhinitis due to animal (cat) (dog) hair and dander: Secondary | ICD-10-CM | POA: Diagnosis not present

## 2019-06-26 DIAGNOSIS — J3089 Other allergic rhinitis: Secondary | ICD-10-CM | POA: Diagnosis not present

## 2019-06-26 DIAGNOSIS — J3081 Allergic rhinitis due to animal (cat) (dog) hair and dander: Secondary | ICD-10-CM | POA: Diagnosis not present

## 2019-06-26 DIAGNOSIS — J301 Allergic rhinitis due to pollen: Secondary | ICD-10-CM | POA: Diagnosis not present

## 2019-06-28 ENCOUNTER — Ambulatory Visit: Payer: Medicare HMO | Attending: Internal Medicine

## 2019-06-28 DIAGNOSIS — Z23 Encounter for immunization: Secondary | ICD-10-CM | POA: Insufficient documentation

## 2019-06-28 NOTE — Progress Notes (Signed)
   Covid-19 Vaccination Clinic  Name:  Kimberly Schroeder    MRN: [id number] DOB: 10/23/1950  06/28/2019  [name] was observed post Covid-19 immunization for 15 minutes without incidence. She was provided with Vaccine Information Sheet and instruction to access the V-Safe system.   [name] was instructed to call 911 with any severe reactions post vaccine: Marland Kitchen Difficulty breathing  . Swelling of your face and throat  . A fast heartbeat  . A bad rash all over your body  . Dizziness and weakness    Immunizations Administered    Name Date Dose VIS Date Route   Pfizer COVID-19 Vaccine 06/28/2019 11:27 AM 0.3 mL 04/27/2019 Intramuscular   Manufacturer: [place]   Lot: [id number]   [place]: [id number]

## 2019-07-10 DIAGNOSIS — J3081 Allergic rhinitis due to animal (cat) (dog) hair and dander: Secondary | ICD-10-CM | POA: Diagnosis not present

## 2019-07-10 DIAGNOSIS — J3089 Other allergic rhinitis: Secondary | ICD-10-CM | POA: Diagnosis not present

## 2019-07-10 DIAGNOSIS — J301 Allergic rhinitis due to pollen: Secondary | ICD-10-CM | POA: Diagnosis not present

## 2019-07-18 DIAGNOSIS — R69 Illness, unspecified: Secondary | ICD-10-CM | POA: Diagnosis not present

## 2019-07-25 DIAGNOSIS — J3081 Allergic rhinitis due to animal (cat) (dog) hair and dander: Secondary | ICD-10-CM | POA: Diagnosis not present

## 2019-07-25 DIAGNOSIS — J301 Allergic rhinitis due to pollen: Secondary | ICD-10-CM | POA: Diagnosis not present

## 2019-07-25 DIAGNOSIS — J3089 Other allergic rhinitis: Secondary | ICD-10-CM | POA: Diagnosis not present

## 2019-08-07 DIAGNOSIS — R69 Illness, unspecified: Secondary | ICD-10-CM | POA: Diagnosis not present

## 2019-08-07 DIAGNOSIS — J301 Allergic rhinitis due to pollen: Secondary | ICD-10-CM | POA: Diagnosis not present

## 2019-08-07 DIAGNOSIS — J3089 Other allergic rhinitis: Secondary | ICD-10-CM | POA: Diagnosis not present

## 2019-08-07 DIAGNOSIS — J3081 Allergic rhinitis due to animal (cat) (dog) hair and dander: Secondary | ICD-10-CM | POA: Diagnosis not present

## 2019-08-10 DIAGNOSIS — J301 Allergic rhinitis due to pollen: Secondary | ICD-10-CM | POA: Diagnosis not present

## 2019-08-10 DIAGNOSIS — J3089 Other allergic rhinitis: Secondary | ICD-10-CM | POA: Diagnosis not present

## 2019-08-10 DIAGNOSIS — J3081 Allergic rhinitis due to animal (cat) (dog) hair and dander: Secondary | ICD-10-CM | POA: Diagnosis not present

## 2019-08-13 DIAGNOSIS — H5319 Other subjective visual disturbances: Secondary | ICD-10-CM | POA: Diagnosis not present

## 2019-08-13 DIAGNOSIS — H2513 Age-related nuclear cataract, bilateral: Secondary | ICD-10-CM | POA: Diagnosis not present

## 2019-08-21 DIAGNOSIS — J301 Allergic rhinitis due to pollen: Secondary | ICD-10-CM | POA: Diagnosis not present

## 2019-08-21 DIAGNOSIS — J3089 Other allergic rhinitis: Secondary | ICD-10-CM | POA: Diagnosis not present

## 2019-08-21 DIAGNOSIS — J3081 Allergic rhinitis due to animal (cat) (dog) hair and dander: Secondary | ICD-10-CM | POA: Diagnosis not present

## 2019-09-04 DIAGNOSIS — J3081 Allergic rhinitis due to animal (cat) (dog) hair and dander: Secondary | ICD-10-CM | POA: Diagnosis not present

## 2019-09-04 DIAGNOSIS — J301 Allergic rhinitis due to pollen: Secondary | ICD-10-CM | POA: Diagnosis not present

## 2019-09-04 DIAGNOSIS — J3089 Other allergic rhinitis: Secondary | ICD-10-CM | POA: Diagnosis not present

## 2019-09-05 DIAGNOSIS — Z01419 Encounter for gynecological examination (general) (routine) without abnormal findings: Secondary | ICD-10-CM | POA: Diagnosis not present

## 2019-09-05 DIAGNOSIS — Z1231 Encounter for screening mammogram for malignant neoplasm of breast: Secondary | ICD-10-CM | POA: Diagnosis not present

## 2019-09-05 DIAGNOSIS — Z6828 Body mass index (BMI) 28.0-28.9, adult: Secondary | ICD-10-CM | POA: Diagnosis not present

## 2019-09-11 DIAGNOSIS — R070 Pain in throat: Secondary | ICD-10-CM | POA: Diagnosis not present

## 2019-09-11 DIAGNOSIS — J351 Hypertrophy of tonsils: Secondary | ICD-10-CM | POA: Diagnosis not present

## 2019-09-21 DIAGNOSIS — J3081 Allergic rhinitis due to animal (cat) (dog) hair and dander: Secondary | ICD-10-CM | POA: Diagnosis not present

## 2019-09-21 DIAGNOSIS — J3089 Other allergic rhinitis: Secondary | ICD-10-CM | POA: Diagnosis not present

## 2019-09-21 DIAGNOSIS — J301 Allergic rhinitis due to pollen: Secondary | ICD-10-CM | POA: Diagnosis not present

## 2019-09-24 DIAGNOSIS — J301 Allergic rhinitis due to pollen: Secondary | ICD-10-CM | POA: Diagnosis not present

## 2019-09-24 DIAGNOSIS — J3089 Other allergic rhinitis: Secondary | ICD-10-CM | POA: Diagnosis not present

## 2019-09-24 DIAGNOSIS — J3081 Allergic rhinitis due to animal (cat) (dog) hair and dander: Secondary | ICD-10-CM | POA: Diagnosis not present

## 2019-10-02 DIAGNOSIS — J3081 Allergic rhinitis due to animal (cat) (dog) hair and dander: Secondary | ICD-10-CM | POA: Diagnosis not present

## 2019-10-02 DIAGNOSIS — J301 Allergic rhinitis due to pollen: Secondary | ICD-10-CM | POA: Diagnosis not present

## 2019-10-02 DIAGNOSIS — J3089 Other allergic rhinitis: Secondary | ICD-10-CM | POA: Diagnosis not present

## 2019-10-09 DIAGNOSIS — J301 Allergic rhinitis due to pollen: Secondary | ICD-10-CM | POA: Diagnosis not present

## 2019-10-09 DIAGNOSIS — J3081 Allergic rhinitis due to animal (cat) (dog) hair and dander: Secondary | ICD-10-CM | POA: Diagnosis not present

## 2019-10-09 DIAGNOSIS — J3089 Other allergic rhinitis: Secondary | ICD-10-CM | POA: Diagnosis not present

## 2019-10-12 ENCOUNTER — Other Ambulatory Visit: Payer: Self-pay

## 2019-10-12 ENCOUNTER — Encounter: Payer: Self-pay | Admitting: Internal Medicine

## 2019-10-12 ENCOUNTER — Ambulatory Visit (INDEPENDENT_AMBULATORY_CARE_PROVIDER_SITE_OTHER): Payer: Medicare HMO | Admitting: Internal Medicine

## 2019-10-12 VITALS — BP 120/72 | HR 75 | Temp 98.0°F | Ht 61.5 in | Wt 153.4 lb

## 2019-10-12 DIAGNOSIS — R739 Hyperglycemia, unspecified: Secondary | ICD-10-CM

## 2019-10-12 DIAGNOSIS — Z13220 Encounter for screening for lipoid disorders: Secondary | ICD-10-CM | POA: Diagnosis not present

## 2019-10-12 DIAGNOSIS — K219 Gastro-esophageal reflux disease without esophagitis: Secondary | ICD-10-CM | POA: Diagnosis not present

## 2019-10-12 DIAGNOSIS — Z0000 Encounter for general adult medical examination without abnormal findings: Secondary | ICD-10-CM

## 2019-10-12 DIAGNOSIS — Z23 Encounter for immunization: Secondary | ICD-10-CM | POA: Diagnosis not present

## 2019-10-12 NOTE — Patient Instructions (Signed)
Ge tfasting lab appt . Continue lifestyle intervention healthy eating and exercise .    Health Maintenance, Female Adopting a healthy lifestyle and getting preventive care are important in promoting health and wellness. Ask your health care provider about:  The right schedule for you to have regular tests and exams.  Things you can do on your own to prevent diseases and keep yourself healthy. What should I know about diet, weight, and exercise? Eat a healthy diet   Eat a diet that includes plenty of vegetables, fruits, low-fat dairy products, and lean protein.  Do not eat a lot of foods that are high in solid fats, added sugars, or sodium. Maintain a healthy weight Body mass index (BMI) is used to identify weight problems. It estimates body fat based on height and weight. Your health care provider can help determine your BMI and help you achieve or maintain a healthy weight. Get regular exercise Get regular exercise. This is one of the most important things you can do for your health. Most adults should:  Exercise for at least 150 minutes each week. The exercise should increase your heart rate and make you sweat (moderate-intensity exercise).  Do strengthening exercises at least twice a week. This is in addition to the moderate-intensity exercise.  Spend less time sitting. Even light physical activity can be beneficial. Watch cholesterol and blood lipids Have your blood tested for lipids and cholesterol at 67 years of age, then have this test every 5 years. Have your cholesterol levels checked more often if:  Your lipid or cholesterol levels are high.  You are older than 67 years of age.  You are at high risk for heart disease. What should I know about cancer screening? Depending on your health history and family history, you may need to have cancer screening at various ages. This may include screening for:  Breast cancer.  Cervical cancer.  Colorectal cancer.  Skin  cancer.  Lung cancer. What should I know about heart disease, diabetes, and high blood pressure? Blood pressure and heart disease  High blood pressure causes heart disease and increases the risk of stroke. This is more likely to develop in people who have high blood pressure readings, are of African descent, or are overweight.  Have your blood pressure checked: ? Every 3-5 years if you are 27-34 years of age. ? Every year if you are 52 years old or older. Diabetes Have regular diabetes screenings. This checks your fasting blood sugar level. Have the screening done:  Once every three years after age 52 if you are at a normal weight and have a low risk for diabetes.  More often and at a younger age if you are overweight or have a high risk for diabetes. What should I know about preventing infection? Hepatitis B If you have a higher risk for hepatitis B, you should be screened for this virus. Talk with your health care provider to find out if you are at risk for hepatitis B infection. Hepatitis C Testing is recommended for:  Everyone born from [birth year] through 1963.  Anyone with known risk factors for hepatitis C. Sexually transmitted infections (STIs)  Get screened for STIs, including gonorrhea and chlamydia, if: ? You are sexually active and are younger than 67 years of age. ? You are older than 67 years of age and your health care provider tells you that you are at risk for this type of infection. ? Your sexual activity has changed since you were last  screened, and you are at increased risk for chlamydia or gonorrhea. Ask your health care provider if you are at risk.  Ask your health care provider about whether you are at high risk for HIV. Your health care provider may recommend a prescription medicine to help prevent HIV infection. If you choose to take medicine to prevent HIV, you should first get tested for HIV. You should then be tested every 3 months for as long as you are taking  the medicine. Pregnancy  If you are about to stop having your period (premenopausal) and you may become pregnant, seek counseling before you get pregnant.  Take 400 to 800 micrograms (mcg) of folic acid every day if you become pregnant.  Ask for birth control (contraception) if you want to prevent pregnancy. Osteoporosis and menopause Osteoporosis is a disease in which the bones lose minerals and strength with aging. This can result in bone fractures. If you are 82 years old or older, or if you are at risk for osteoporosis and fractures, ask your health care provider if you should:  Be screened for bone loss.  Take a calcium or vitamin D supplement to lower your risk of fractures.  Be given hormone replacement therapy (HRT) to treat symptoms of menopause. Follow these instructions at home: Lifestyle  Do not use any products that contain nicotine or tobacco, such as cigarettes, e-cigarettes, and chewing tobacco. If you need help quitting, ask your health care provider.  Do not use street drugs.  Do not share needles.  Ask your health care provider for help if you need support or information about quitting drugs. Alcohol use  Do not drink alcohol if: ? Your health care provider tells you not to drink. ? You are pregnant, may be pregnant, or are planning to become pregnant.  If you drink alcohol: ? Limit how much you use to 0-1 drink a day. ? Limit intake if you are breastfeeding.  Be aware of how much alcohol is in your drink. In the [place] one drink equals one 12 oz bottle of beer (355 mL), one 5 oz glass of wine (148 mL), or one 1 oz glass of hard liquor (44 mL). General instructions  Schedule regular health, dental, and eye exams.  Stay current with your vaccines.  Tell your health care provider if: ? You often feel depressed. ? You have ever been abused or do not feel safe at home. Summary  Adopting a healthy lifestyle and getting preventive care are important in  promoting health and wellness.  Follow your health care provider's instructions about healthy diet, exercising, and getting tested or screened for diseases.  Follow your health care provider's instructions on monitoring your cholesterol and blood pressure. This information is not intended to replace advice given to you by your health care provider. Make sure you discuss any questions you have with your health care provider. Document Revised: 04/26/2018 Document Reviewed: 04/26/2018 Elsevier Patient Education  2020 [employer].

## 2019-10-12 NOTE — Progress Notes (Signed)
Chief Complaint  Patient presents with  . Annual Exam    Doing well    HPI: Kimberly Schroeder 69 y.o. comes in today for Preventive Medicare exam/ wellness visit .Since last visit. She has  Received her allergy injections   Has nares  Saw dr Benjamine Mola for tonsil. check   Has bone spur  In nose .  Sees [facility] Health Maintenance  Topic Date Due  . MAMMOGRAM  07/15/2016  . INFLUENZA VACCINE  12/16/2019  . COLONOSCOPY  07/08/2021  . TETANUS/TDAP  03/30/2027  . DEXA SCAN  Completed  . COVID-19 Vaccine  Completed  . PNA vac Low Risk Adult  Completed   Health Maintenance Review LIFESTYLE:  Exercise:  Walking  As much possible  Tobacco/ETS: no Alcohol:  Min low Sugar beverages: no Sleep: 8 hours  Drug use: no HH:  2  1 dog    Hearing:  Ok   Vision:  No limitations at present . Last eye check UTD  Safety:  Has smoke detector and wears seat belts. No excess sun exposure. Sees dentist regularly.  Falls:  no  Memory: Felt to be good  , no concern from her or her family.  Depression: No anhedonia unusual crying or depressive symptoms  Nutrition: Eats well balanced diet; adequate calcium and vitamin D. No swallowing chewing problems.  Injury: no major injuries in the last six months.  Other healthcare providers:  Reviewed today .  Preventive parameters: up-to-date  Reviewed  ADLS:   There are no problems or need for assistance  driving, feeding, obtaining food, dressing, toileting and bathing, managing money using phone. She is independent. ROS: gets ocass hb  gerd  GEN/ HEENT: No fever, significant weight changes sweats headaches vision problems hearing changes, CV/ PULM; No chest pain shortness of breath cough, syncope,edema  change in exercise tolerance. GI /GU: No adominal pain, vomiting, change in bowel habits. No blood in the stool. No significant GU symptoms. SKIN/HEME: ,no acute skin rashes suspicious lesions or bleeding. No lymphadenopathy, nodules, masses.  NEURO/  PSYCH:  No neurologic signs such as weakness numbness. No depression anxiety. IMM/ Allergy: No unusual infections.  Allergy .   REST of 12 system review negative except as per HPI   Past Medical History:  Diagnosis Date  . Allergic rhinitis    allergist evaluation spring fall and dust.  . Ectopic pregnancy    x 2  . GERD (gastroesophageal reflux disease)   . Hematuria, microscopic    eval 2001  Dr Terance Hart    Family History  Problem Relation Age of Onset  . Colon cancer Father 48  . Colon cancer Paternal Uncle        57's  . Parkinsonism Other   . Arthritis Other   . Hypertension Other     Social History   Socioeconomic History  . Marital status: Married    Spouse name: Not on file  . Number of children: Not on file  . Years of education: Not on file  . Highest education level: Not on file  Occupational History  . Not on file  Tobacco Use  . Smoking status: Never Smoker  . Smokeless tobacco: Never Used  Substance and Sexual Activity  . Alcohol use: Yes    Comment: occasional wine  . Drug use: No  . Sexual activity: Not on file  Other Topics Concern  . Not on file  Social History Narrative   Married   HH of 2 and rescue  dog   Exercises   G2   Husband recovering from severe myocardiopathy viral and chf getting better will not need a heart transplant   Has had ileitis with  pouch   Husband  Retired    No ets.    caretraking  Family elederly.    Social Determinants of Health   Financial Resource Strain:   . Difficulty of Paying Living Expenses:   Food Insecurity:   . Worried About Charity fundraiser in the Last Year:   . Arboriculturist in the Last Year:   Transportation Needs:   . Film/video editor (Medical):   Marland Kitchen Lack of Transportation (Non-Medical):   Physical Activity:   . Days of Exercise per Week:   . Minutes of Exercise per Session:   Stress:   . Feeling of Stress :   Social Connections:   . Frequency of Communication with Friends and  Family:   . Frequency of Social Gatherings with Friends and Family:   . Attends Religious Services:   . Active Member of Clubs or Organizations:   . Attends Archivist Meetings:   Marland Kitchen Marital Status:     Outpatient Encounter Medications as of 10/12/2019  Medication Sig  . cetirizine (ZYRTEC) 10 MG tablet Take 10 mg by mouth daily.    . Cholecalciferol (VITAMIN D3 MAXIMUM STRENGTH) 125 MCG (5000 UT) capsule Take 5,000 Units by mouth daily. With vitamin K and minerals  . EPIPEN 2-PAK 0.3 MG/0.3ML SOAJ injection USE AS DIRECTED AS NEEDED  . Estradiol (VAGIFEM) 10 MCG TABS Place vaginally.    . fish oil-omega-3 fatty acids 1000 MG capsule Take 2 g by mouth daily.    . NON FORMULARY Allergy shots   . psyllium (METAMUCIL) 58.6 % packet Take 1 packet by mouth daily.    . Calcium-Vitamin D-Vitamin K (VIACTIV PO) Take by mouth. Reported on 10/14/2015  . Multiple Vitamins-Minerals (MULTIVITAMIN WITH MINERALS) tablet Take 1 tablet by mouth daily.     Facility-Administered Encounter Medications as of 10/12/2019  Medication  . 0.9 %  sodium chloride infusion    EXAM:  BP 120/72   Pulse 75   Temp 98 F (36.7 C) (Temporal)   Ht 5' 1.5" (1.562 m)   Wt 153 lb 6.4 oz (69.6 kg)   SpO2 94%   BMI 28.52 kg/m   Body mass index is 28.52 kg/m.  Physical Exam: Vital signs reviewed [id number] is a well-developed well-nourished alert cooperative   who appears stated age in no acute distress.  HEENT: normocephalic atraumatic , Eyes: PERRL EOM's full, conjunctiva clear, , Ears: no deformity EAC's clear TMs with normal landmarks. Mouth:masked NECK: supple without masses, thyromegaly or bruits. CHEST/PULM:  Clear to auscultation and percussion breath sounds equal no wheeze , rales or rhonchi. No chest wall deformities or tenderness. CV: PMI is nondisplaced, S1 S2 no gallops, murmurs, rubs. Peripheral pulses are full without delay.No JVD .  ABDOMEN: Bowel sounds normal nontender  No guard or rebound,  no hepato splenomegal no CVA tenderness.   Extremtities:  No clubbing cyanosis or edema, no acute joint swelling or redness no focal atrophy NEURO:  Oriented x3, cranial nerves 3-12 appear to be intact, no obvious focal weakness,gait within normal limits no abnormal reflexes or asymmetrical SKIN: No acute rashes normal turgor, color, no bruising or petechiae. PSYCH: Oriented, good eye contact, no obvious depression anxiety, cognition and judgment appear normal. LN: no cervical axillary inguinal adenopathy No noted deficits in memory,  attention, and speech.   Lab Results  Component Value Date   WBC 4.5 03/22/2016   HGB 13.8 03/22/2016   HCT 40.5 03/22/2016   PLT 345.0 03/22/2016   GLUCOSE 104 (H) 04/04/2018   CHOL 194 04/04/2018   TRIG 114.0 04/04/2018   HDL 62.00 04/04/2018   LDLDIRECT 132.6 04/16/2011   LDLCALC 109 (H) 04/04/2018   ALT 24 03/22/2016   AST 20 03/22/2016   NA 141 04/04/2018   K 4.8 04/04/2018   CL 107 04/04/2018   CREATININE 0.91 04/04/2018   BUN 18 04/04/2018   CO2 28 04/04/2018   TSH 1.94 03/22/2016   HGBA1C 5.6 04/04/2018    ASSESSMENT AND PLAN:  Discussed the following assessment and plan:  Visit for preventive health examination  Elevated blood sugar - not diabetic  will check fasting lab and 1c  - Plan: Basic metabolic panel, CBC with Differential/Platelet, Hemoglobin A1c, Hepatic function panel, Lipid panel  Gastroesophageal reflux disease, unspecified whether esophagitis present - Plan: Basic metabolic panel, CBC with Differential/Platelet, Hemoglobin A1c, Hepatic function panel, Lipid panel  Need for pneumococcal vaccination - Plan: Pneumococcal polysaccharide vaccine 23-valent greater than or equal to 2yo subcutaneous/IM  Screening, lipid - Plan: Basic metabolic panel, CBC with Differential/Platelet, Hemoglobin A1c, Hepatic function panel, Lipid panel Incorrect albeling by ehr as diabetic and not sure why  See prev notes and calls  So I went in  and tried to take out the  inapprotiate metrics  On this visit  But still should follow  Due for pna 23  Disc hcm update etc   Patient Care Team: Burnis Medin, MD as PCP - General Mosetta Anis, MD (Allergy) Dian Queen, MD (Obstetrics and Gynecology) Mauri Pole, MD as Consulting Physician (Gastroenterology) Martinique, Amy, MD as Consulting Physician (Dermatology)  Patient Instructions    Ge tfasting lab appt . Continue lifestyle intervention healthy eating and exercise .    Health Maintenance, Female Adopting a healthy lifestyle and getting preventive care are important in promoting health and wellness. Ask your health care provider about:  The right schedule for you to have regular tests and exams.  Things you can do on your own to prevent diseases and keep yourself healthy. What should I know about diet, weight, and exercise? Eat a healthy diet   Eat a diet that includes plenty of vegetables, fruits, low-fat dairy products, and lean protein.  Do not eat a lot of foods that are high in solid fats, added sugars, or sodium. Maintain a healthy weight Body mass index (BMI) is used to identify weight problems. It estimates body fat based on height and weight. Your health care provider can help determine your BMI and help you achieve or maintain a healthy weight. Get regular exercise Get regular exercise. This is one of the most important things you can do for your health. Most adults should:  Exercise for at least 150 minutes each week. The exercise should increase your heart rate and make you sweat (moderate-intensity exercise).  Do strengthening exercises at least twice a week. This is in addition to the moderate-intensity exercise.  Spend less time sitting. Even light physical activity can be beneficial. Watch cholesterol and blood lipids Have your blood tested for lipids and cholesterol at 67 years of age, then have this test every 5 years. Have your  cholesterol levels checked more often if:  Your lipid or cholesterol levels are high.  You are older than 67 years of age.  You are at  high risk for heart disease. What should I know about cancer screening? Depending on your health history and family history, you may need to have cancer screening at various ages. This may include screening for:  Breast cancer.  Cervical cancer.  Colorectal cancer.  Skin cancer.  Lung cancer. What should I know about heart disease, diabetes, and high blood pressure? Blood pressure and heart disease  High blood pressure causes heart disease and increases the risk of stroke. This is more likely to develop in people who have high blood pressure readings, are of African descent, or are overweight.  Have your blood pressure checked: ? Every 3-5 years if you are 20-56 years of age. ? Every year if you are 52 years old or older. Diabetes Have regular diabetes screenings. This checks your fasting blood sugar level. Have the screening done:  Once every three years after age 67 if you are at a normal weight and have a low risk for diabetes.  More often and at a younger age if you are overweight or have a high risk for diabetes. What should I know about preventing infection? Hepatitis B If you have a higher risk for hepatitis B, you should be screened for this virus. Talk with your health care provider to find out if you are at risk for hepatitis B infection. Hepatitis C Testing is recommended for:  Everyone born from [birth year] through 1963.  Anyone with known risk factors for hepatitis C. Sexually transmitted infections (STIs)  Get screened for STIs, including gonorrhea and chlamydia, if: ? You are sexually active and are younger than 67 years of age. ? You are older than 67 years of age and your health care provider tells you that you are at risk for this type of infection. ? Your sexual activity has changed since you were last screened, and you are  at increased risk for chlamydia or gonorrhea. Ask your health care provider if you are at risk.  Ask your health care provider about whether you are at high risk for HIV. Your health care provider may recommend a prescription medicine to help prevent HIV infection. If you choose to take medicine to prevent HIV, you should first get tested for HIV. You should then be tested every 3 months for as long as you are taking the medicine. Pregnancy  If you are about to stop having your period (premenopausal) and you may become pregnant, seek counseling before you get pregnant.  Take 400 to 800 micrograms (mcg) of folic acid every day if you become pregnant.  Ask for birth control (contraception) if you want to prevent pregnancy. Osteoporosis and menopause Osteoporosis is a disease in which the bones lose minerals and strength with aging. This can result in bone fractures. If you are 13 years old or older, or if you are at risk for osteoporosis and fractures, ask your health care provider if you should:  Be screened for bone loss.  Take a calcium or vitamin D supplement to lower your risk of fractures.  Be given hormone replacement therapy (HRT) to treat symptoms of menopause. Follow these instructions at home: Lifestyle  Do not use any products that contain nicotine or tobacco, such as cigarettes, e-cigarettes, and chewing tobacco. If you need help quitting, ask your health care provider.  Do not use street drugs.  Do not share needles.  Ask your health care provider for help if you need support or information about quitting drugs. Alcohol use  Do not drink alcohol if: ?  Your health care provider tells you not to drink. ? You are pregnant, may be pregnant, or are planning to become pregnant.  If you drink alcohol: ? Limit how much you use to 0-1 drink a day. ? Limit intake if you are breastfeeding.  Be aware of how much alcohol is in your drink. In the [place] one drink equals one 12 oz  bottle of beer (355 mL), one 5 oz glass of wine (148 mL), or one 1 oz glass of hard liquor (44 mL). General instructions  Schedule regular health, dental, and eye exams.  Stay current with your vaccines.  Tell your health care provider if: ? You often feel depressed. ? You have ever been abused or do not feel safe at home. Summary  Adopting a healthy lifestyle and getting preventive care are important in promoting health and wellness.  Follow your health care provider's instructions about healthy diet, exercising, and getting tested or screened for diseases.  Follow your health care provider's instructions on monitoring your cholesterol and blood pressure. This information is not intended to replace advice given to you by your health care provider. Make sure you discuss any questions you have with your health care provider. Document Revised: 04/26/2018 Document Reviewed: 04/26/2018 Elsevier Patient Education  2020 [employer] Fredrico Beedle M.D.

## 2019-10-16 ENCOUNTER — Other Ambulatory Visit: Payer: Self-pay

## 2019-10-17 ENCOUNTER — Other Ambulatory Visit (INDEPENDENT_AMBULATORY_CARE_PROVIDER_SITE_OTHER): Payer: Medicare HMO

## 2019-10-17 DIAGNOSIS — K219 Gastro-esophageal reflux disease without esophagitis: Secondary | ICD-10-CM

## 2019-10-17 DIAGNOSIS — Z13220 Encounter for screening for lipoid disorders: Secondary | ICD-10-CM | POA: Diagnosis not present

## 2019-10-17 DIAGNOSIS — R739 Hyperglycemia, unspecified: Secondary | ICD-10-CM

## 2019-10-17 LAB — CBC WITH DIFFERENTIAL/PLATELET
Basophils Absolute: 0 10*3/uL (ref 0.0–0.1)
Basophils Relative: 1.2 % (ref 0.0–3.0)
Eosinophils Absolute: 0.1 10*3/uL (ref 0.0–0.7)
Eosinophils Relative: 3.3 % (ref 0.0–5.0)
HCT: 39.3 % (ref 36.0–46.0)
Hemoglobin: 13.3 g/dL (ref 12.0–15.0)
Lymphocytes Relative: 35.8 % (ref 12.0–46.0)
Lymphs Abs: 1.3 10*3/uL (ref 0.7–4.0)
MCHC: 34 g/dL (ref 30.0–36.0)
MCV: 90.2 fl (ref 78.0–100.0)
Monocytes Absolute: 0.3 10*3/uL (ref 0.1–1.0)
Monocytes Relative: 7.6 % (ref 3.0–12.0)
Neutro Abs: 1.9 10*3/uL (ref 1.4–7.7)
Neutrophils Relative %: 52.1 % (ref 43.0–77.0)
Platelets: 326 10*3/uL (ref 150.0–400.0)
RBC: 4.35 Mil/uL (ref 3.87–5.11)
RDW: 12.9 % (ref 11.5–15.5)
WBC: 3.7 10*3/uL — ABNORMAL LOW (ref 4.0–10.5)

## 2019-10-17 LAB — LIPID PANEL
Cholesterol: 196 mg/dL (ref 0–200)
HDL: 56.3 mg/dL (ref >39.00)
LDL Cholesterol: 119 mg/dL — ABNORMAL HIGH (ref 0–99)
NonHDL: 139.81
Total CHOL/HDL Ratio: 3
Triglycerides: 102 mg/dL (ref 0.0–149.0)
VLDL: 20.4 mg/dL (ref 0.0–40.0)

## 2019-10-17 LAB — BASIC METABOLIC PANEL
BUN: 15 mg/dL (ref 6–23)
CO2: 29 mEq/L (ref 19–32)
Calcium: 9.5 mg/dL (ref 8.4–10.5)
Chloride: 108 mEq/L (ref 96–112)
Creatinine, Ser: 0.92 mg/dL (ref 0.40–1.20)
GFR: 60.9 mL/min (ref >60.00)
Glucose, Bld: 89 mg/dL (ref 70–99)
Potassium: 4.5 mEq/L (ref 3.5–5.1)
Sodium: 140 mEq/L (ref 135–145)

## 2019-10-17 LAB — HEPATIC FUNCTION PANEL
ALT: 15 U/L (ref 0–35)
AST: 16 U/L (ref 0–37)
Albumin: 4.2 g/dL (ref 3.5–5.2)
Alkaline Phosphatase: 50 U/L (ref 39–117)
Bilirubin, Direct: 0.1 mg/dL (ref 0.0–0.3)
Total Bilirubin: 0.5 mg/dL (ref 0.2–1.2)
Total Protein: 6.7 g/dL (ref 6.0–8.3)

## 2019-10-17 LAB — HEMOGLOBIN A1C: Hgb A1c MFr Bld: 5.4 % (ref 4.6–6.5)

## 2019-10-22 DIAGNOSIS — J301 Allergic rhinitis due to pollen: Secondary | ICD-10-CM | POA: Diagnosis not present

## 2019-10-22 DIAGNOSIS — J3081 Allergic rhinitis due to animal (cat) (dog) hair and dander: Secondary | ICD-10-CM | POA: Diagnosis not present

## 2019-10-22 DIAGNOSIS — J3089 Other allergic rhinitis: Secondary | ICD-10-CM | POA: Diagnosis not present

## 2019-10-22 NOTE — Progress Notes (Signed)
Blood sugars are normal   no diabetes of concerns  . Results of labs stable    No new advice  Continue lifestyle intervention healthy eating and exercise .

## 2019-10-24 DIAGNOSIS — J3089 Other allergic rhinitis: Secondary | ICD-10-CM | POA: Diagnosis not present

## 2019-10-24 DIAGNOSIS — J3081 Allergic rhinitis due to animal (cat) (dog) hair and dander: Secondary | ICD-10-CM | POA: Diagnosis not present

## 2019-10-24 DIAGNOSIS — J301 Allergic rhinitis due to pollen: Secondary | ICD-10-CM | POA: Diagnosis not present

## 2019-10-29 DIAGNOSIS — J3081 Allergic rhinitis due to animal (cat) (dog) hair and dander: Secondary | ICD-10-CM | POA: Diagnosis not present

## 2019-10-29 DIAGNOSIS — J3089 Other allergic rhinitis: Secondary | ICD-10-CM | POA: Diagnosis not present

## 2019-10-29 DIAGNOSIS — J301 Allergic rhinitis due to pollen: Secondary | ICD-10-CM | POA: Diagnosis not present

## 2019-11-12 DIAGNOSIS — J3089 Other allergic rhinitis: Secondary | ICD-10-CM | POA: Diagnosis not present

## 2019-11-12 DIAGNOSIS — J3081 Allergic rhinitis due to animal (cat) (dog) hair and dander: Secondary | ICD-10-CM | POA: Diagnosis not present

## 2019-11-12 DIAGNOSIS — J301 Allergic rhinitis due to pollen: Secondary | ICD-10-CM | POA: Diagnosis not present

## 2019-11-20 DIAGNOSIS — J301 Allergic rhinitis due to pollen: Secondary | ICD-10-CM | POA: Diagnosis not present

## 2019-11-20 DIAGNOSIS — J3081 Allergic rhinitis due to animal (cat) (dog) hair and dander: Secondary | ICD-10-CM | POA: Diagnosis not present

## 2019-11-20 DIAGNOSIS — J3089 Other allergic rhinitis: Secondary | ICD-10-CM | POA: Diagnosis not present

## 2019-12-03 DIAGNOSIS — J3089 Other allergic rhinitis: Secondary | ICD-10-CM | POA: Diagnosis not present

## 2019-12-03 DIAGNOSIS — J3081 Allergic rhinitis due to animal (cat) (dog) hair and dander: Secondary | ICD-10-CM | POA: Diagnosis not present

## 2019-12-03 DIAGNOSIS — J301 Allergic rhinitis due to pollen: Secondary | ICD-10-CM | POA: Diagnosis not present

## 2019-12-10 DIAGNOSIS — J3081 Allergic rhinitis due to animal (cat) (dog) hair and dander: Secondary | ICD-10-CM | POA: Diagnosis not present

## 2019-12-10 DIAGNOSIS — J3089 Other allergic rhinitis: Secondary | ICD-10-CM | POA: Diagnosis not present

## 2019-12-10 DIAGNOSIS — J301 Allergic rhinitis due to pollen: Secondary | ICD-10-CM | POA: Diagnosis not present

## 2019-12-11 DIAGNOSIS — J342 Deviated nasal septum: Secondary | ICD-10-CM | POA: Diagnosis not present

## 2019-12-11 DIAGNOSIS — J351 Hypertrophy of tonsils: Secondary | ICD-10-CM | POA: Diagnosis not present

## 2019-12-20 DIAGNOSIS — J3089 Other allergic rhinitis: Secondary | ICD-10-CM | POA: Diagnosis not present

## 2019-12-20 DIAGNOSIS — J3081 Allergic rhinitis due to animal (cat) (dog) hair and dander: Secondary | ICD-10-CM | POA: Diagnosis not present

## 2019-12-20 DIAGNOSIS — J301 Allergic rhinitis due to pollen: Secondary | ICD-10-CM | POA: Diagnosis not present

## 2020-01-08 DIAGNOSIS — J3081 Allergic rhinitis due to animal (cat) (dog) hair and dander: Secondary | ICD-10-CM | POA: Diagnosis not present

## 2020-01-08 DIAGNOSIS — J301 Allergic rhinitis due to pollen: Secondary | ICD-10-CM | POA: Diagnosis not present

## 2020-01-08 DIAGNOSIS — J3089 Other allergic rhinitis: Secondary | ICD-10-CM | POA: Diagnosis not present

## 2020-01-22 DIAGNOSIS — R69 Illness, unspecified: Secondary | ICD-10-CM | POA: Diagnosis not present

## 2020-01-23 DIAGNOSIS — J3089 Other allergic rhinitis: Secondary | ICD-10-CM | POA: Diagnosis not present

## 2020-01-23 DIAGNOSIS — J3081 Allergic rhinitis due to animal (cat) (dog) hair and dander: Secondary | ICD-10-CM | POA: Diagnosis not present

## 2020-01-23 DIAGNOSIS — J301 Allergic rhinitis due to pollen: Secondary | ICD-10-CM | POA: Diagnosis not present

## 2020-02-04 DIAGNOSIS — J301 Allergic rhinitis due to pollen: Secondary | ICD-10-CM | POA: Diagnosis not present

## 2020-02-04 DIAGNOSIS — J3089 Other allergic rhinitis: Secondary | ICD-10-CM | POA: Diagnosis not present

## 2020-02-04 DIAGNOSIS — J3081 Allergic rhinitis due to animal (cat) (dog) hair and dander: Secondary | ICD-10-CM | POA: Diagnosis not present

## 2020-02-05 DIAGNOSIS — H5202 Hypermetropia, left eye: Secondary | ICD-10-CM | POA: Diagnosis not present

## 2020-02-05 DIAGNOSIS — H2513 Age-related nuclear cataract, bilateral: Secondary | ICD-10-CM | POA: Diagnosis not present

## 2020-02-05 DIAGNOSIS — H5201 Hypermetropia, right eye: Secondary | ICD-10-CM | POA: Diagnosis not present

## 2020-02-05 DIAGNOSIS — H52222 Regular astigmatism, left eye: Secondary | ICD-10-CM | POA: Diagnosis not present

## 2020-02-05 DIAGNOSIS — H524 Presbyopia: Secondary | ICD-10-CM | POA: Diagnosis not present

## 2020-02-08 ENCOUNTER — Other Ambulatory Visit: Payer: Self-pay

## 2020-02-08 ENCOUNTER — Ambulatory Visit (INDEPENDENT_AMBULATORY_CARE_PROVIDER_SITE_OTHER): Payer: Medicare HMO

## 2020-02-08 DIAGNOSIS — Z23 Encounter for immunization: Secondary | ICD-10-CM

## 2020-02-19 DIAGNOSIS — J3089 Other allergic rhinitis: Secondary | ICD-10-CM | POA: Diagnosis not present

## 2020-02-19 DIAGNOSIS — J301 Allergic rhinitis due to pollen: Secondary | ICD-10-CM | POA: Diagnosis not present

## 2020-02-19 DIAGNOSIS — J3081 Allergic rhinitis due to animal (cat) (dog) hair and dander: Secondary | ICD-10-CM | POA: Diagnosis not present

## 2020-03-06 ENCOUNTER — Other Ambulatory Visit: Payer: Self-pay

## 2020-03-06 ENCOUNTER — Emergency Department (HOSPITAL_COMMUNITY)
Admission: EM | Admit: 2020-03-06 | Discharge: 2020-03-06 | Disposition: A | Discharge status: Home / Self Care | Payer: Medicare HMO | Attending: Emergency Medicine | Admitting: Emergency Medicine

## 2020-03-06 ENCOUNTER — Emergency Department (HOSPITAL_COMMUNITY): Payer: Medicare HMO

## 2020-03-06 ENCOUNTER — Emergency Department
Admission: RE | Admit: 2020-03-06 | Discharge: 2020-03-06 | Disposition: A | Discharge status: Home / Self Care | Payer: Medicare HMO | Source: Ambulatory Visit

## 2020-03-06 ENCOUNTER — Telehealth: Payer: Self-pay | Admitting: *Deleted

## 2020-03-06 ENCOUNTER — Encounter (HOSPITAL_COMMUNITY): Payer: Self-pay | Admitting: *Deleted

## 2020-03-06 VITALS — BP 169/94 | HR 88 | Temp 98.2°F | Resp 17

## 2020-03-06 DIAGNOSIS — I517 Cardiomegaly: Secondary | ICD-10-CM

## 2020-03-06 DIAGNOSIS — R110 Nausea: Secondary | ICD-10-CM | POA: Diagnosis not present

## 2020-03-06 DIAGNOSIS — R072 Precordial pain: Secondary | ICD-10-CM | POA: Insufficient documentation

## 2020-03-06 DIAGNOSIS — R079 Chest pain, unspecified: Secondary | ICD-10-CM

## 2020-03-06 DIAGNOSIS — J3081 Allergic rhinitis due to animal (cat) (dog) hair and dander: Secondary | ICD-10-CM | POA: Diagnosis not present

## 2020-03-06 DIAGNOSIS — J301 Allergic rhinitis due to pollen: Secondary | ICD-10-CM | POA: Diagnosis not present

## 2020-03-06 DIAGNOSIS — R1013 Epigastric pain: Secondary | ICD-10-CM | POA: Insufficient documentation

## 2020-03-06 DIAGNOSIS — J3089 Other allergic rhinitis: Secondary | ICD-10-CM | POA: Diagnosis not present

## 2020-03-06 LAB — CBC
HCT: 43.5 % (ref 36.0–46.0)
Hemoglobin: 14.3 g/dL (ref 12.0–15.0)
MCH: 28.9 pg (ref 26.0–34.0)
MCHC: 32.9 g/dL (ref 30.0–36.0)
MCV: 88.1 fL (ref 80.0–100.0)
Platelets: 354 10*3/uL (ref 150–400)
RBC: 4.94 MIL/uL (ref 3.87–5.11)
RDW: 12.5 % (ref 11.5–15.5)
WBC: 7 10*3/uL (ref 4.0–10.5)
nRBC: 0 % (ref 0.0–0.2)

## 2020-03-06 LAB — BASIC METABOLIC PANEL
Anion gap: 8 (ref 5–15)
BUN: 14 mg/dL (ref 8–23)
CO2: 25 mmol/L (ref 22–32)
Calcium: 10.1 mg/dL (ref 8.9–10.3)
Chloride: 108 mmol/L (ref 98–111)
Creatinine, Ser: 1.1 mg/dL — ABNORMAL HIGH (ref 0.44–1.00)
GFR, Estimated: 55 mL/min — ABNORMAL LOW (ref >60)
Glucose, Bld: 101 mg/dL — ABNORMAL HIGH (ref 70–99)
Potassium: 3.7 mmol/L (ref 3.5–5.1)
Sodium: 141 mmol/L (ref 135–145)

## 2020-03-06 LAB — TROPONIN I (HIGH SENSITIVITY): Troponin I (High Sensitivity): 2 ng/L (ref <18)

## 2020-03-06 MED ORDER — LIDOCAINE VISCOUS HCL 2 % MT SOLN
15.0000 mL | Freq: Once | OROMUCOSAL | Status: AC
  Administered 2020-03-06: 15 mL via ORAL
  Filled 2020-03-06: qty 15

## 2020-03-06 MED ORDER — SUCRALFATE 1 G PO TABS: 1.0000 g | ORAL_TABLET | Freq: Three times a day (TID) | ORAL | 0 refills | Status: DC

## 2020-03-06 MED ORDER — SUCRALFATE 1 G PO TABS
1.0000 g | ORAL_TABLET | Freq: Once | ORAL | Status: AC
  Administered 2020-03-06: 1 g via ORAL
  Filled 2020-03-06: qty 1

## 2020-03-06 MED ORDER — ALUM & MAG HYDROXIDE-SIMETH 200-200-20 MG/5ML PO SUSP
30.0000 mL | Freq: Once | ORAL | Status: AC
  Administered 2020-03-06: 30 mL via ORAL
  Filled 2020-03-06: qty 30

## 2020-03-06 NOTE — Discharge Instructions (Signed)
  Because we do not have a recent EKG on file, and due to your recurrent chest discomfort, it is recommended you are evaluated further in the emergency department today.

## 2020-03-06 NOTE — Telephone Encounter (Signed)
Clinic RN spoke with patient. Patient reports she has pressure in the center of her chest and is unsure if its her Acid Reflux acting up or a problem with her hear. She does report being under more stress currently. Informed patient that Dr.Panosh is out of the office the rest of the day and we have no more openings. I advised patient to go to an urgent care first and that if there is cardiac changes they will have go to hospital. Patient verbalized understanding and will go to an urgent care.

## 2020-03-06 NOTE — ED Triage Notes (Signed)
Pt c/o chest pressure intermittently for 3 weeks. Sometimes feels pain in back, does not radiate anywhere. Was belching some but has resolved some with Pepcid for the last week.  Has seemed to help some, but hasn't gone away. Pain 2/10 No hx of heart problems

## 2020-03-06 NOTE — ED Triage Notes (Signed)
Pt was seen at [facility] in [place] due to 3wk hx of CP which pt initially thought was indigestion.  Pt reports that she did get relief with pepcid but the pain continues to come back.

## 2020-03-06 NOTE — ED Provider Notes (Signed)
[place] EMERGENCY DEPARTMENT Provider Note   CSN: [id number] Arrival date & time: 03/06/20  1557     History Chief Complaint  Patient presents with  . Chest Pain    Kimberly Schroeder is a 69 y.o. female.  The history is provided by the patient and medical records. No language interpreter was used.  Chest Pain Pain location:  Substernal area and epigastric Pain quality: aching and burning   Pain radiates to:  Does not radiate Pain severity:  Moderate Onset quality:  Gradual Timing:  Constant Progression:  Waxing and waning Chronicity:  Recurrent Context: stress   Relieved by:  Nothing Worsened by:  Nothing Ineffective treatments:  None tried Associated symptoms: heartburn and nausea   Associated symptoms: no abdominal pain, no altered mental status, no back pain, no cough, no diaphoresis, no fatigue, no fever, no headache, no lower extremity edema, no near-syncope, no palpitations, no shortness of breath, no syncope and no vomiting        Past Medical History:  Diagnosis Date  . Allergic rhinitis    allergist evaluation spring fall and dust.  . Ectopic pregnancy    x 2  . GERD (gastroesophageal reflux disease)   . Hematuria, microscopic    eval 2001  Dr Terance Hart    Patient Active Problem List   Diagnosis Date Noted  . Elevated blood sugar level 06/22/2017  . Hematuria 04/23/2011  . Other and unspecified hyperlipidemia 04/23/2011  . Allergic rhinitis   . ABDOMINAL PAIN, RIGHT UPPER QUADRANT, HX OF 11/11/2008  . Allergic rhinitis 01/26/2007  . GERD 01/26/2007    Past Surgical History:  Procedure Laterality Date  . LAPAROSCOPY     ectopic pregnancy  . LAPAROSCOPY FOR ECTOPIC PREGNANCY     x2     OB History    Gravida  2   Para  0   Term      Preterm      AB      Living        SAB      TAB      Ectopic      Multiple      Live Births           Obstetric Comments  Ectopic preg x 2        Family History    Problem Relation Age of Onset  . Colon cancer Father 38  . Colon cancer Paternal Uncle        104's  . Parkinsonism Other   . Arthritis Other   . Hypertension Other     Social History   Tobacco Use  . Smoking status: Never Smoker  . Smokeless tobacco: Never Used  Vaping Use  . Vaping Use: Never used  Substance Use Topics  . Alcohol use: Yes    Comment: occasional wine  . Drug use: No    Home Medications Prior to Admission medications   Medication Sig Start Date End Date Taking? Authorizing Provider  cetirizine (ZYRTEC) 10 MG tablet Take 10 mg by mouth daily.     Yes [provider]  Cholecalciferol (VITAMIN D3 MAXIMUM STRENGTH) 125 MCG (5000 UT) capsule Take 5,000 Units by mouth in the morning and at bedtime. With vitamin K and minerals    Yes [provider]  EPIPEN 2-PAK 0.3 MG/0.3ML SOAJ injection USE AS DIRECTED AS NEEDED 05/31/16  Yes [provider]  Estradiol (VAGIFEM) 10 MCG TABS Place 1 each vaginally once a week.  Yes [provider]  fish oil-omega-3 fatty acids 1000 MG capsule Take 2 g by mouth daily.     Yes [provider]  psyllium (METAMUCIL) 58.6 % packet Take 1 packet by mouth every evening.    Yes [provider]    Allergies    Iohexol  Review of Systems   Review of Systems  Constitutional: Negative for chills, diaphoresis, fatigue and fever.  HENT: Negative for congestion.   Eyes: Negative for visual disturbance.  Respiratory: Negative for cough, chest tightness, shortness of breath and wheezing.   Cardiovascular: Positive for chest pain. Negative for palpitations, leg swelling, syncope and near-syncope.  Gastrointestinal: Positive for heartburn and nausea. Negative for abdominal pain, constipation, diarrhea and vomiting.  Genitourinary: Negative for dysuria, flank pain and frequency.  Musculoskeletal: Negative for back pain, neck pain and neck stiffness.  Skin: Negative for rash and wound.   Neurological: Negative for light-headedness and headaches.  Psychiatric/Behavioral: Negative for agitation and confusion.  All other systems reviewed and are negative.   Physical Exam Updated Vital Signs BP (!) 160/83 (BP Location: Right Arm)   Pulse 98   Temp 98.7 F (37.1 C) (Oral)   Resp 18   Ht 5\' 2"  (1.575 m)   Wt 68 kg   SpO2 100%   BMI 27.44 kg/m   Physical Exam Vitals and nursing note reviewed.  Constitutional:      General: She is not in acute distress.    Appearance: She is well-developed. She is not ill-appearing, toxic-appearing or diaphoretic.  HENT:     Head: Normocephalic and atraumatic.     Right Ear: External ear normal.     Left Ear: External ear normal.     Nose: Nose normal.     Mouth/Throat:     Pharynx: No oropharyngeal exudate.  Eyes:     Conjunctiva/sclera: Conjunctivae normal.     Pupils: Pupils are equal, round, and reactive to light.  Cardiovascular:     Rate and Rhythm: Normal rate and regular rhythm.  Pulmonary:     Effort: No respiratory distress.     Breath sounds: No stridor. No decreased breath sounds, wheezing, rhonchi or rales.  Abdominal:     General: There is no distension.     Tenderness: There is no abdominal tenderness. There is no rebound.  Musculoskeletal:     Cervical back: Normal range of motion and neck supple.     Right lower leg: No tenderness. No edema.     Left lower leg: No tenderness. No edema.  Skin:    General: Skin is warm.     Capillary Refill: Capillary refill takes less than 2 seconds.     Coloration: Skin is not pale.     Findings: No erythema or rash.  Neurological:     General: No focal deficit present.     Mental Status: She is alert and oriented to person, place, and time.     Motor: No abnormal muscle tone.     Coordination: Coordination normal.     Deep Tendon Reflexes: Reflexes are normal and symmetric.     ED Results / Procedures / Treatments   Labs (all labs ordered are listed, but only  abnormal results are displayed) Labs Reviewed  BASIC METABOLIC PANEL - Abnormal; Notable for the following components:      Result Value   Glucose, Bld 101 (*)    Creatinine, Ser 1.10 (*)    GFR, Estimated 55 (*)    All  other components within normal limits  CBC  TROPONIN I (HIGH SENSITIVITY)  TROPONIN I (HIGH SENSITIVITY)    EKG EKG Interpretation  Date/Time:  Thursday March 06 2020 16:13:20 EDT Ventricular Rate:  90 PR Interval:  136 QRS Duration: 68 QT Interval:  358 QTC Calculation: 437 R Axis:   -23 Text Interpretation: Normal sinus rhythm Normal ECG When compared to prior, similar appearance. No STEMI Confirmed by Antony Blackbird [phone]) on 03/06/2020 8:07:46 PM   Radiology DG Chest 2 View  Result Date: 03/06/2020 CLINICAL DATA:  Chest pain EXAM: CHEST - 2 VIEW COMPARISON:  None. FINDINGS: Lungs are clear. Heart size and pulmonary vascularity are normal. No adenopathy. No pneumothorax. No bone lesions. IMPRESSION: Lungs clear.  Cardiac silhouette normal. Electronically Signed   By: Lowella Grip III M.D.   On: 03/06/2020 16:45    Procedures Procedures (including critical care time)  Medications Ordered in ED Medications  alum & mag hydroxide-simeth (MAALOX/MYLANTA) 200-200-20 MG/5ML suspension 30 mL (30 mLs Oral Given 03/06/20 2212)    And  lidocaine (XYLOCAINE) 2 % viscous mouth solution 15 mL (15 mLs Oral Given 03/06/20 2212)  sucralfate (CARAFATE) tablet 1 g (1 g Oral Given 03/06/20 2212)    ED Course  I have reviewed the triage vital signs and the nursing notes.  Pertinent labs & imaging results that were available during my care of the patient were reviewed by me and considered in my medical decision making (see chart for details).    MDM Rules/Calculators/A&P                          Kimberly Schroeder is a 69 y.o. female with a past medical history significant for GERD and hyperlipidemia who presents with chest discomfort and upper abdominal  discomfort.  Patient reports that her pain is been going on and off for around 3 weeks.  She reports it feels somewhat similar to when she has had reflux troubles in the past but her home Prilosec has not fully resolved her symptoms.  She does report increase in stress recently as she is trying to sell a family member's home.  She reports some nausea but no vomiting.  She denies any leg pain or leg swelling.  No history of DVT or PE.  No recent trauma.  She denies any flank pain, constipation, diarrhea, or urinary changes.  She denies any fevers, chills, ingestion, or cough.  The pain is not exertional or pleuritic and she reports that sitting up and standing up seems to improve her symptoms.  She reports laying flat makes it worse.  Patient went to urgent care earlier today and due to the discomfort, she was told to come to the ED for evaluation of chest discomfort.  On arrival, EKG shows no STEMI.  Patient was evaluated and her breath sounds were clear and chest was nontender.  Abdomen was nontender.  No murmur.  Good pulses in extremities.  No lower extremity tenderness or swelling.  Vital signs reassuring on arrival aside from elevated blood pressures.  She reports he does not have hypertension but is very anxious as were discussing things.  Patient had work-up here which was reassuring.  Her troponin was negative.  As her symptoms of been ongoing for weeks and she has been in the process of her emergency work-up for multiple hours today before her initial blood draw, we had a shared decision-making conversation and decided to hold on delta troponin as  it was only 2 earlier.  Her labs were also otherwise reassuring.  Chest x-ray reassuring.  Clinically I suspect her symptoms are related to reflux given the laying flat causing it to worsen and the location of discomfort similar to prior.  Patient was given a GI cocktail with possible improvement in symptoms.  She was stable for 6 hours and 45 minutes  during her ED stay with no acute worsening of symptoms.  After reassuring work-up, we agreed to hold on further labs including lipase and liver function as well as delta troponin.  Clinically we have low suspicion for DVT or PE so we agreed to hold on dimer.  She will follow up with both her PCP, and her GI team.  We also included a number to call to set up with cardiology given the discomfort and her age.  She will be given prescription for  Patient agreed with plan of care and had no other questions or concerns.  Patient was discharged in good condition.   Final Clinical Impression(s) / ED Diagnoses Final diagnoses:  Precordial pain    Rx / DC Orders ED Discharge Orders         Ordered    sucralfate (CARAFATE) 1 g tablet  3 times daily with meals & bedtime        03/06/20 2236          Clinical Impression: 1. Precordial pain     Disposition: Admit  This note was prepared with assistance of Dragon voice recognition software. Occasional wrong-word or sound-a-like substitutions may have occurred due to the inherent limitations of voice recognition software.     Keinan Brouillet, Gwenyth Allegra, MD 03/06/20 2300

## 2020-03-06 NOTE — Telephone Encounter (Signed)
Claiborne Billings the RN from team health called stating the patient has been having chest pressure for the last couple weeks. Claiborne Billings made her aware to go to the ER or Urgent Care. Claiborne Billings states the patient refused and wants to come in the office. I made [facility] aware we do not have any openings for in office today. I also made Claiborne Billings aware I would get the message to the nurse regarding this.

## 2020-03-06 NOTE — ED Notes (Signed)
Patient verbalizes understanding of discharge instructions. Opportunity for questioning and answers were provided. Armband removed by staff, pt discharged from ED ambulatory.   

## 2020-03-06 NOTE — ED Notes (Signed)
Patient is being discharged from the Urgent Care and sent to the Emergency Department via POV. Per Leeroy Cha, patient is in need of higher level of care due to chest pain/pressure and abnormal EKG. Patient is aware and verbalizes understanding of plan of care.  Vitals:   03/06/20 1452  BP: (!) 169/94  Pulse: 88  Resp: 17  Temp: 98.2 F (36.8 C)  SpO2: 100%

## 2020-03-06 NOTE — Discharge Instructions (Signed)
Your history and exam today are consistent with a reflux type chest discomfort.  We had a shared decision-making conversation and agreed to hold on further lab or imaging work-up today.  Please follow-up with your GI team and your PCP.  We also include number to call to set up with cardiology.  Please rest and stay hydrated and use your home Prilosec and this Carafate to help with your abdominal discomfort.  I suspect your stress may have worsened your known reflux disease.  If any symptoms change or worsen acutely, please return to the nearest emergency department.

## 2020-03-06 NOTE — ED Provider Notes (Signed)
[name]    CSN: [id number] Arrival date & time: 03/06/20  1444      History   Chief Complaint Chief Complaint  Patient presents with  . Chest pressure    HPI Kimberly Schroeder is a 69 y.o. female.   HPI  Kimberly Schroeder is a 69 y.o. female presenting to UC with c/o intermittent chest pressure for about 3 weeks.  Pain is 2/10.  Pain radiates into her back at times. Today, pain was pressure-like radiating into her back, lasted about an hour around 12PM.  Associated belching, which resolved by taking OTC Pepcid this week.  She called her PCP who did not have availability, pt was direct to UC for an EKG.  No prior hx of heart problems.    Past Medical History:  Diagnosis Date  . Allergic rhinitis    allergist evaluation spring fall and dust.  . Ectopic pregnancy    x 2  . GERD (gastroesophageal reflux disease)   . Hematuria, microscopic    eval 2001  Dr Terance Hart    Patient Active Problem List   Diagnosis Date Noted  . Elevated blood sugar level 06/22/2017  . Hematuria 04/23/2011  . Other and unspecified hyperlipidemia 04/23/2011  . Allergic rhinitis   . ABDOMINAL PAIN, RIGHT UPPER QUADRANT, HX OF 11/11/2008  . Allergic rhinitis 01/26/2007  . GERD 01/26/2007    Past Surgical History:  Procedure Laterality Date  . LAPAROSCOPY     ectopic pregnancy  . LAPAROSCOPY FOR ECTOPIC PREGNANCY     x2    OB History    Gravida  2   Para  0   Term      Preterm      AB      Living        SAB      TAB      Ectopic      Multiple      Live Births           Obstetric Comments  Ectopic preg x 2         Home Medications    Prior to Admission medications   Medication Sig Start Date End Date Taking? Authorizing Provider  Calcium-Vitamin D-Vitamin K (VIACTIV PO) Take by mouth. Reported on 10/14/2015    [provider]  cetirizine (ZYRTEC) 10 MG tablet Take 10 mg by mouth daily.      [provider]  Cholecalciferol  (VITAMIN D3 MAXIMUM STRENGTH) 125 MCG (5000 UT) capsule Take 5,000 Units by mouth daily. With vitamin K and minerals    [provider]  EPIPEN 2-PAK 0.3 MG/0.3ML SOAJ injection USE AS DIRECTED AS NEEDED 05/31/16   [provider]  Estradiol (VAGIFEM) 10 MCG TABS Place vaginally.      [provider]  fish oil-omega-3 fatty acids 1000 MG capsule Take 2 g by mouth daily.      [provider]  Multiple Vitamins-Minerals (MULTIVITAMIN WITH MINERALS) tablet Take 1 tablet by mouth daily.      [provider]  NON FORMULARY Allergy shots     [provider]  psyllium (METAMUCIL) 58.6 % packet Take 1 packet by mouth daily.      [provider]    Family History Family History  Problem Relation Age of Onset  . Colon cancer Father 3  . Colon cancer Paternal Uncle        9's  . Parkinsonism Other   . Arthritis Other   .  Hypertension Other     Social History Social History   Tobacco Use  . Smoking status: Never Smoker  . Smokeless tobacco: Never Used  Vaping Use  . Vaping Use: Never used  Substance Use Topics  . Alcohol use: Yes    Comment: occasional wine  . Drug use: No     Allergies   Iohexol   Review of Systems Review of Systems  Constitutional: Negative for chills, diaphoresis, fatigue and fever.  Cardiovascular: Positive for chest pain. Negative for palpitations.  Gastrointestinal: Negative for nausea and vomiting.  Neurological: Negative for dizziness and headaches.     Physical Exam Triage Vital Signs ED Triage Vitals  Enc Vitals Group     BP 03/06/20 1452 (!) 169/94     Pulse Rate 03/06/20 1452 88     Resp 03/06/20 1452 17     Temp 03/06/20 1452 98.2 F (36.8 C)     Temp Source 03/06/20 1452 Oral     SpO2 03/06/20 1452 100 %     Weight --      Height --      Head Circumference --      Peak Flow --      Pain Score 03/06/20 1455 2     Pain Loc --      Pain Edu? --      Excl. in [place]? --    No  data found.  Updated Vital Signs BP (!) 169/94 (BP Location: Right Arm)   Pulse 88   Temp 98.2 F (36.8 C) (Oral)   Resp 17   SpO2 100%   Visual Acuity Right Eye Distance:   Left Eye Distance:   Bilateral Distance:    Right Eye Near:   Left Eye Near:    Bilateral Near:     Physical Exam Vitals and nursing note reviewed.  Constitutional:      General: She is not in acute distress.    Appearance: Normal appearance. She is well-developed. She is not ill-appearing, toxic-appearing or diaphoretic.  HENT:     Head: Normocephalic and atraumatic.  Cardiovascular:     Rate and Rhythm: Normal rate and regular rhythm.  Pulmonary:     Effort: Pulmonary effort is normal. No respiratory distress.     Breath sounds: Normal breath sounds. No stridor. No wheezing, rhonchi or rales.  Chest:     Chest wall: No tenderness.  Abdominal:     General: There is no distension.     Palpations: Abdomen is soft.     Tenderness: There is no abdominal tenderness. There is no right CVA tenderness or left CVA tenderness.  Musculoskeletal:        General: Normal range of motion.     Cervical back: Normal range of motion.  Skin:    General: Skin is warm and dry.  Neurological:     Mental Status: She is alert and oriented to person, place, and time.  Psychiatric:        Behavior: Behavior normal.      UC Treatments / Results  Labs (all labs ordered are listed, but only abnormal results are displayed) Labs Reviewed - No data to display  EKG Date/Time:03/06/2020   15:03:42 Ventricular Rate: 85 PR Interval: 134 QRS Duration: 70 QT Interval: 376 QTC Calculation: 447 P-R-T axes: 50   -17   32 Text Interpretation: Normal sinus rhythm. Possible Left atrial enlargement. Left ventricular hypertrophy. Abnormal EKG.  Left atrial enlargement and ventricular hypertrophy new since 2010.  Radiology No results found.  Procedures Procedures (including critical care time)  Medications  Ordered in UC Medications - No data to display  Initial Impression / Assessment and Plan / UC Course  I have reviewed the triage vital signs and the nursing notes.  Pertinent labs & imaging results that were available during my care of the patient were reviewed by me and considered in my medical decision making (see chart for details).    Called pt's PCP office to see if more recent EKG available. None available Due to worsening chest pressure, radiating into back and new ventricular hypertrophy, recommend pt evaluated further in emergency department. Pt understanding and agreeable with plan. Declined EMS transport. Pt's husband will drive her POV to hospital.   Final Clinical Impressions(s) / UC Diagnoses   Final diagnoses:  Chest pain, unspecified type  Left ventricular hypertrophy     Discharge Instructions      Because we do not have a recent EKG on file, and due to your recurrent chest discomfort, it is recommended you are evaluated further in the emergency department today.     ED Prescriptions    None     PDMP not reviewed this encounter.   Noe Gens, [place] 03/06/20 1531

## 2020-03-10 ENCOUNTER — Telehealth (INDEPENDENT_AMBULATORY_CARE_PROVIDER_SITE_OTHER): Payer: Medicare HMO | Admitting: Family Medicine

## 2020-03-10 ENCOUNTER — Other Ambulatory Visit: Payer: Self-pay

## 2020-03-10 ENCOUNTER — Encounter: Payer: Self-pay | Admitting: Family Medicine

## 2020-03-10 VITALS — Temp 97.6°F | Wt 150.0 lb

## 2020-03-10 DIAGNOSIS — J069 Acute upper respiratory infection, unspecified: Secondary | ICD-10-CM | POA: Diagnosis not present

## 2020-03-10 MED ORDER — FLUTICASONE PROPIONATE 50 MCG/ACT NA SUSP: 1.0000 | Freq: Every day | NASAL | 0 refills | Status: DC

## 2020-03-10 NOTE — Progress Notes (Signed)
Virtual Visit via Video Note  I connected with Kimberly Schroeder on 03/10/20 at  1:30 PM EDT by a video enabled telemedicine application 2/2 [id number] pandemic and verified that I am speaking with the correct person using two identifiers.  Location patient: home Location provider:work or home office Persons participating in the virtual visit: patient, provider  I discussed the limitations of evaluation and management by telemedicine and the availability of in person appointments. The patient expressed understanding and agreed to proceed.   HPI: Pt is a 69 yo female with pmh sig for allergies, GERD followed by Dr. Regis Bill seen for acute concern.  Pt endorses sinus pressure for the last few days and post nasal drainage in am since Friday (4 days).  Pt took tylenol which eased the pressure.  Pt endorses some ear pressure.  Denies sore throat, rhinorrhea, n/v, diarrhea, fever, sick contacts.  Pt was afraid to take any other OTC medicine as she was recently seen in UC and ED on 10/21 for chest pain 2/2 GERD.  Pt notes BP elevation during those visits.  She notes improvement in sx since started on carfate 1 g QID.    Pt inquires if she should reschedule her COVID booster vaccine on Saturday.   ROS: See pertinent positives and negatives per HPI.  Past Medical History:  Diagnosis Date  . Allergic rhinitis    allergist evaluation spring fall and dust.  . Ectopic pregnancy    x 2  . GERD (gastroesophageal reflux disease)   . Hematuria, microscopic    eval 2001  Dr Terance Hart    Past Surgical History:  Procedure Laterality Date  . LAPAROSCOPY     ectopic pregnancy  . LAPAROSCOPY FOR ECTOPIC PREGNANCY     x2    Family History  Problem Relation Age of Onset  . Colon cancer Father 94  . Colon cancer Paternal Uncle        3's  . Parkinsonism Other   . Arthritis Other   . Hypertension Other       Current Outpatient Medications:  .  cetirizine (ZYRTEC) 10 MG tablet, Take 10 mg by mouth  daily.  , Disp: , Rfl:  .  Cholecalciferol (VITAMIN D3 MAXIMUM STRENGTH) 125 MCG (5000 UT) capsule, Take 5,000 Units by mouth in the morning and at bedtime. With vitamin K and minerals , Disp: , Rfl:  .  EPIPEN 2-PAK 0.3 MG/0.3ML SOAJ injection, USE AS DIRECTED AS NEEDED, Disp: , Rfl: 1 .  Estradiol (VAGIFEM) 10 MCG TABS, Place 1 each vaginally once a week. , Disp: , Rfl:  .  fish oil-omega-3 fatty acids 1000 MG capsule, Take 2 g by mouth daily.  , Disp: , Rfl:  .  psyllium (METAMUCIL) 58.6 % packet, Take 1 packet by mouth every evening. , Disp: , Rfl:  .  sucralfate (CARAFATE) 1 g tablet, Take 1 tablet (1 g total) by mouth 4 (four) times daily -  with meals and at bedtime., Disp: 30 tablet, Rfl: 0  Current Facility-Administered Medications:  .  0.9 %  sodium chloride infusion, 500 mL, Intravenous, Continuous, Nandigam, Kavitha V, MD  EXAM:  VITALS per patient if applicable: RR between 26-71 bpm  GENERAL: alert, oriented, appears well and in no acute distress  HEENT: atraumatic, conjunctiva clear, no obvious abnormalities on inspection of external nose and ears  NECK: normal movements of the head and neck  LUNGS: on inspection no signs of respiratory distress, breathing rate appears normal, no obvious gross SOB,  gasping or wheezing  CV: no obvious cyanosis  MS: moves all visible extremities without noticeable abnormality  PSYCH/NEURO: pleasant and cooperative, no obvious depression or anxiety, speech and thought processing grossly intact  ASSESSMENT AND PLAN:  Discussed the following assessment and plan:  Viral upper respiratory tract infection  -Continue supportive care.  Okay to take Tylenol.  Can also try OTC Coricidin HBP -Continue to monitor for progression into acute sinusitis -Consider Covid testing -Given precautions -Patient advised to wait on Covid booster vaccine until current symptoms resolve. - Plan: fluticasone (FLONASE) 50 MCG/ACT nasal spray  Follow-up as  needed with PCP  I discussed the assessment and treatment plan with the patient. The patient was provided an opportunity to ask questions and all were answered. The patient agreed with the plan and demonstrated an understanding of the instructions.   The patient was advised to call back or seek an in-person evaluation if the symptoms worsen or if the condition fails to improve as anticipated.   Billie Ruddy, MD

## 2020-03-11 ENCOUNTER — Telehealth: Payer: Self-pay

## 2020-03-11 ENCOUNTER — Ambulatory Visit: Payer: Medicare HMO | Admitting: Internal Medicine

## 2020-03-11 NOTE — Telephone Encounter (Signed)
Spoke with patient regarding rescheduling appointment with Dr. Margaretann Loveless today due to being seen in a virtual appointment yesterday due to respiratory symptoms. Advised patient that we do not suggest coming in to the office with any symptoms due to covid. Offered patient to a virtual appointment to discuss issues with Dr. Margaretann Loveless in the mean time until patient can be seen in the office. Patient does not wish to have a virtual appointment at this time and would like to proceed with rescheduling for an in person office visit. Patient rescheduled for 03/21/20 with Dr. Margaretann Loveless.

## 2020-03-15 ENCOUNTER — Ambulatory Visit: Payer: Medicare HMO

## 2020-03-17 ENCOUNTER — Ambulatory Visit (INDEPENDENT_AMBULATORY_CARE_PROVIDER_SITE_OTHER): Payer: Medicare HMO | Admitting: Internal Medicine

## 2020-03-17 ENCOUNTER — Other Ambulatory Visit: Payer: Self-pay

## 2020-03-17 VITALS — BP 162/98 | HR 84 | Temp 98.0°F | Ht 62.0 in | Wt 152.0 lb

## 2020-03-17 DIAGNOSIS — K219 Gastro-esophageal reflux disease without esophagitis: Secondary | ICD-10-CM | POA: Diagnosis not present

## 2020-03-17 DIAGNOSIS — R030 Elevated blood-pressure reading, without diagnosis of hypertension: Secondary | ICD-10-CM

## 2020-03-17 DIAGNOSIS — R079 Chest pain, unspecified: Secondary | ICD-10-CM | POA: Diagnosis not present

## 2020-03-17 DIAGNOSIS — Z79899 Other long term (current) drug therapy: Secondary | ICD-10-CM | POA: Diagnosis not present

## 2020-03-17 DIAGNOSIS — J309 Allergic rhinitis, unspecified: Secondary | ICD-10-CM | POA: Diagnosis not present

## 2020-03-17 MED ORDER — AMLODIPINE BESYLATE 2.5 MG PO TABS: 2.5000 mg | ORAL_TABLET | Freq: Every day | ORAL | 1 refills | Status: DC

## 2020-03-17 MED ORDER — OMEPRAZOLE 20 MG PO CPDR: 20.0000 mg | DELAYED_RELEASE_CAPSULE | Freq: Every day | ORAL | 1 refills | Status: DC

## 2020-03-17 NOTE — Progress Notes (Signed)
Chief Complaint  Patient presents with  . Follow-up    indigestion better, getting headaches, b/p not better    HPI: Kimberly Schroeder 69 y.o. come in for FU ed 10 21  For Cp poss gi cause  And had abn ekg  With nl fu  Had neg tro-ponone felt could be a gi sx   t saw dr banks video  10 25?  For uri  She has allergy and better    Developed  sinus pressure  After that and video per dr Volanda Napoleon felt viral and sx rx  Has appt Cardiology 11 5 delayed cause had a sx HA etc taht could be covid  Taking  Famotidine bid and Carafate  Ran out    Has in scalp   Has had has recently    Not testing for covid.  At home    bp were up but not as much as in ed  Readings  Were high 130/140  And 32 81  Was older machine  She has no hx of ht ongoing  ROS: See pertinent positives and negatives per HPI.some stress with selling moms house in [place] and bro still living there   Past Medical History:  Diagnosis Date  . Allergic rhinitis    allergist evaluation spring fall and dust.  . Ectopic pregnancy    x 2  . GERD (gastroesophageal reflux disease)   . Hematuria, microscopic    eval 2001  Dr Terance Hart    Family History  Problem Relation Age of Onset  . Colon cancer Father 18  . Colon cancer Paternal Uncle        73's  . Parkinsonism Other   . Arthritis Other   . Hypertension Other     Social History   Socioeconomic History  . Marital status: Married    Spouse name: Not on file  . Number of children: Not on file  . Years of education: Not on file  . Highest education level: Not on file  Occupational History  . Not on file  Tobacco Use  . Smoking status: Never Smoker  . Smokeless tobacco: Never Used  Vaping Use  . Vaping Use: Never used  Substance and Sexual Activity  . Alcohol use: Yes    Comment: occasional wine  . Drug use: No  . Sexual activity: Not on file  Other Topics Concern  . Not on file  Social History Narrative   Married   HH of 2 and rescue dog   Exercises   G2    Husband recovering from severe myocardiopathy viral and chf getting better will not need a heart transplant   Has had ileitis with  pouch   Husband  Retired    No ets.    caretraking  Family elederly.    Social Determinants of Health   Financial Resource Strain:   . Difficulty of Paying Living Expenses: Not on file  Food Insecurity:   . Worried About Charity fundraiser in the Last Year: Not on file  . Ran Out of Food in the Last Year: Not on file  Transportation Needs:   . Lack of Transportation (Medical): Not on file  . Lack of Transportation (Non-Medical): Not on file  Physical Activity:   . Days of Exercise per Week: Not on file  . Minutes of Exercise per Session: Not on file  Stress:   . Feeling of Stress : Not on file  Social Connections:   . Frequency  of Communication with Friends and Family: Not on file  . Frequency of Social Gatherings with Friends and Family: Not on file  . Attends Religious Services: Not on file  . Active Member of Clubs or Organizations: Not on file  . Attends Archivist Meetings: Not on file  . Marital Status: Not on file    Outpatient Medications Prior to Visit  Medication Sig Dispense Refill  . cetirizine (ZYRTEC) 10 MG tablet Take 10 mg by mouth daily.      . Cholecalciferol (VITAMIN D3 MAXIMUM STRENGTH) 125 MCG (5000 UT) capsule Take 5,000 Units by mouth in the morning and at bedtime. With vitamin K and minerals     . EPIPEN 2-PAK 0.3 MG/0.3ML SOAJ injection USE AS DIRECTED AS NEEDED  1  . Estradiol (VAGIFEM) 10 MCG TABS Place 1 each vaginally once a week.     . famotidine (PEPCID) 20 MG tablet Take 20 mg by mouth 2 (two) times daily.    . fish oil-omega-3 fatty acids 1000 MG capsule Take 2 g by mouth daily.      . fluticasone (FLONASE) 50 MCG/ACT nasal spray Place 1 spray into both nostrils daily. 16 g 0  . psyllium (METAMUCIL) 58.6 % packet Take 1 packet by mouth every evening.     . sucralfate (CARAFATE) 1 g tablet Take 1 tablet  (1 g total) by mouth 4 (four) times daily -  with meals and at bedtime. (Patient not taking: Reported on 03/17/2020) 30 tablet 0   Facility-Administered Medications Prior to Visit  Medication Dose Route Frequency Provider Last Rate Last Admin  . 0.9 %  sodium chloride infusion  500 mL Intravenous Continuous Nandigam, Kavitha V, MD         EXAM:  BP (!) 162/98 (BP Location: Left Arm)   Pulse 84   Temp 98 F (36.7 C) (Oral)   Ht 5\' 2"  (1.575 m)   Wt 152 lb (68.9 kg)   SpO2 98%   BMI 27.80 kg/m   Body mass index is 27.8 kg/m. Bp repeat  148/82 and 156/80  Reg and larger cuff  After sitting  GENERAL: vitals reviewed and listed above, alert, oriented, appears well hydrated and in no acute distress HEENT: atraumatic, conjunctiva  clear, no obvious abnormalities on inspection of external nose and ears OP : masked  NECK: no obvious masses on inspection palpation  LUNGS: clear to auscultation bilaterally, no wheezes, rales or rhonchi, good air movement CV: HRRR, no clubbing cyanosis or  peripheral edema nl cap refill  MS: moves all extremities without noticeable focal  abnormality PSYCH: pleasant and cooperative, no obvious depression or anxiety Lab Results  Component Value Date   WBC 7.0 03/06/2020   HGB 14.3 03/06/2020   HCT 43.5 03/06/2020   PLT 354 03/06/2020   GLUCOSE 101 (H) 03/06/2020   CHOL 196 10/17/2019   TRIG 102.0 10/17/2019   HDL 56.30 10/17/2019   LDLDIRECT 132.6 04/16/2011   LDLCALC 119 (H) 10/17/2019   ALT 15 10/17/2019   AST 16 10/17/2019   NA 141 03/06/2020   K 3.7 03/06/2020   CL 108 03/06/2020   CREATININE 1.10 (H) 03/06/2020   BUN 14 03/06/2020   CO2 25 03/06/2020   TSH 1.94 03/22/2016   HGBA1C 5.4 10/17/2019   BP Readings from Last 3 Encounters:  03/17/20 (!) 162/98  03/06/20 (!) 156/81  03/06/20 (!) 169/94   reviewed ed and  ekgs  With pt  Most nl one with higher  voltage in 2 leads was reading  lvh  ASSESSMENT AND PLAN:  Discussed the  following assessment and plan:  Chest pain, unspecified type  Gastroesophageal reflux disease, unspecified whether esophagitis present  Medication management  Elevated blood pressure reading  Allergic rhinitis, unspecified seasonality, unspecified trigger Cp may have been gi related   Change to Prilosec  Daily for 3-4 weeks and make fu appt with Gi dr Delane Ginger Keep cards appt.  Bp his  Confirmed up and not in past so much  Has stress but  persistent readings     Add low dose amlodipine   And fu in 2 mos   Opine to cards if prefer different med for bp control . consider secondary ht if  Resistant  Etc .  -Patient advised to return or notify health care team  if  new concerns arise.  Patient Instructions  Your exam is good except BP is up.   Change to  Prilosec  daily  For 3-4 weeks and set up follow appt with  Dr Burna Mortimer for fu and decision about medication.   Keep cardiology appt.   Begin  BP medicine low dose   Please bring your blood pressure cuff to next appointment Take blood pressure readings twice a day for 5-7  days and record .     Take 2 -3 readings at each sitting .   Can send in readings  by My Chart.     Before checking your blood pressure make sure: You are seated and quite for 5 min before checking Feet are flat on the floor Siting in chair with your back supported straight up and down Arm resting on table or arm of chair at heart level Bladder is empty You have NOT had caffeine or tobacco within the last 30 min  If anxiety about checking BP.    readings then back off       [place] K. Pristine Gladhill M.D.

## 2020-03-17 NOTE — Patient Instructions (Addendum)
Your exam is good except BP is up.   Change to  Prilosec  daily  For 3-4 weeks and set up follow appt with  Dr Burna Mortimer for fu and decision about medication.   Keep cardiology appt.   Begin  BP medicine low dose   Please bring your blood pressure cuff to next appointment Take blood pressure readings twice a day for 5-7  days and record .     Take 2 -3 readings at each sitting .   Can send in readings  by My Chart.     Before checking your blood pressure make sure: You are seated and quite for 5 min before checking Feet are flat on the floor Siting in chair with your back supported straight up and down Arm resting on table or arm of chair at heart level Bladder is empty You have NOT had caffeine or tobacco within the last 30 min  If anxiety about checking BP.    readings then back off

## 2020-03-19 ENCOUNTER — Telehealth: Payer: Self-pay

## 2020-03-19 NOTE — Telephone Encounter (Signed)
-----   Message from Mauri Pole, MD sent at 03/19/2020  4:50 PM EDT ----- Thank you, we will get her in for office follow up ----- Message ----- From: Burnis Medin, MD Sent: 03/17/2020   1:01 PM EDT To: Mauri Pole, MD  FYI sending her your way for FU  .

## 2020-03-19 NOTE — Telephone Encounter (Signed)
Called the patient. No answer. Left a message on the voicemail.

## 2020-03-20 NOTE — Telephone Encounter (Signed)
Called the patient. No answer. Left a message on the voicemail explaining the nature of my call. Asked that she call us back and ask to schedule a follow up. Okay to schedule with an APP. She is a patient of Dr Silverio Decamp.

## 2020-03-21 ENCOUNTER — Ambulatory Visit (INDEPENDENT_AMBULATORY_CARE_PROVIDER_SITE_OTHER): Payer: Medicare HMO | Admitting: Internal Medicine

## 2020-03-21 ENCOUNTER — Other Ambulatory Visit: Payer: Self-pay

## 2020-03-21 ENCOUNTER — Encounter: Payer: Self-pay | Admitting: Internal Medicine

## 2020-03-21 VITALS — BP 140/80 | HR 86 | Ht 61.0 in | Wt 152.4 lb

## 2020-03-21 DIAGNOSIS — R072 Precordial pain: Secondary | ICD-10-CM | POA: Diagnosis not present

## 2020-03-21 DIAGNOSIS — I10 Essential (primary) hypertension: Secondary | ICD-10-CM

## 2020-03-21 DIAGNOSIS — E7800 Pure hypercholesterolemia, unspecified: Secondary | ICD-10-CM

## 2020-03-21 MED ORDER — PREDNISONE 50 MG PO TABS: ORAL_TABLET | ORAL | 0 refills | Status: DC

## 2020-03-21 MED ORDER — METOPROLOL TARTRATE 100 MG PO TABS: 100.0000 mg | ORAL_TABLET | Freq: Once | ORAL | 0 refills | Status: DC

## 2020-03-21 MED ORDER — DIPHENHYDRAMINE HCL 50 MG PO TABS: ORAL_TABLET | ORAL | 0 refills | Status: DC

## 2020-03-21 NOTE — Patient Instructions (Addendum)
Medication Instructions:  No Changes In Medications at this time.  *If you need a refill on your cardiac medications before your next appointment, please call your pharmacy*  Lab Work: BMET- 1 week prior to CTA scan  If you have labs (blood work) drawn today and your tests are completely normal, you will receive your results only by: Marland Kitchen MyChart Message (if you have MyChart) OR . A paper copy in the mail If you have any lab test that is abnormal or we need to change your treatment, we will call you to review the results.  Testing/Procedures: Your physician has requested that you have a renal artery duplex. During this test, an ultrasound is used to evaluate blood flow to the kidneys. Allow one hour for this exam. Do not eat after midnight the day before and avoid carbonated beverages. Take your medications as you usually do.  Your physician has requested that you have an echocardiogram. Echocardiography is a painless test that uses sound waves to create images of your heart. It provides your doctor with information about the size and shape of your heart and how well your heart's chambers and valves are working. You may receive an ultrasound enhancing agent through an IV if needed to better visualize your heart during the echo.This procedure takes approximately one hour. There are no restrictions for this procedure. This will take place at the [address].    Your physician has requested that you have cardiac CT. Cardiac computed tomography (CT) is a painless test that uses an x-ray machine to take clear, detailed pictures of your heart. For further information please visit [facility]. Please follow instruction sheet as given.  Follow-Up: At [facility], you and your health needs are our priority.  As part of our continuing mission to provide you with exceptional heart care, we have created designated Provider Care Teams.  These Care Teams include your primary  Cardiologist (physician) and Advanced Practice Providers (APPs -  Physician Assistants and Nurse Practitioners) who all work together to provide you with the care you need, when you need it.  Your next appointment:   2 month(s)  The format for your next appointment:   In Person  Provider:   Cherlynn Kaiser, MD  Other Instructions Your cardiac CT will be scheduled at one of the below locations:   [facility] [address] [place], [place] [address] [phone]  If scheduled at [facility], please arrive at the [facility] main entrance of [facility] 30 minutes prior to test start time. Proceed to the [facility] Radiology Department (first floor) to check-in and test prep.  Please follow these instructions carefully (unless otherwise directed):  On the Night Before the Test: . Be sure to Drink plenty of water. . Do not consume any caffeinated/decaffeinated beverages or chocolate 12 hours prior to your test. . Do not take any antihistamines 12 hours prior to your test. . If the patient has contrast allergy: ? Patient will need a prescription for Prednisone and very clear instructions (as follows): 1. Prednisone 50 mg - take 13 hours prior to test 2. Take another Prednisone 50 mg 7 hours prior to test 3. Take another Prednisone 50 mg 1 hour prior to test 4. Take Benadryl 50 mg 1 hour prior to test . Patient must complete all four doses of above prophylactic medications. . Patient will need a ride after test due to Benadryl.  On the Day of the Test: .  Drink plenty of water. Do not drink any water within one hour of the test. . Do not eat any food 4 hours prior to the test. . You may take your regular medications prior to the test.  . Take metoprolol (Lopressor) 136m (1 tablet) two hours prior to test. . HOLD Furosemide/Hydrochlorothiazide morning of the test. . FEMALES- please wear underwire-free bra if available After the Test: . Drink plenty  of water. . After receiving IV contrast, you may experience a mild flushed feeling. This is normal. . On occasion, you may experience a mild rash up to 24 hours after the test. This is not dangerous. If this occurs, you can take Benadryl 25 mg and increase your fluid intake. . If you experience trouble breathing, this can be serious. If it is severe call 911 IMMEDIATELY. If it is mild, please call our office. . If you take any of these medications: Glipizide/Metformin, Avandament, Glucavance, please do not take 48 hours after completing test unless otherwise instructed.  Once we have confirmed authorization from your insurance company, we will call you to set up a date and time for your test. Based on how quickly your insurance processes prior authorizations requests, please allow up to 4 weeks to be contacted for scheduling your Cardiac CT appointment. Be advised that routine Cardiac CT appointments could be scheduled as many as 8 weeks after your provider has ordered it.  For non-scheduling related questions, please contact the cardiac imaging nurse navigator should you have any questions/concerns: SMarchia Bond Cardiac Imaging Nurse Navigator MBurley Saver Interim Cardiac Imaging Nurse [facility] Direct Office Dial: [phone]  For scheduling needs, including cancellations and rescheduling, please call TVivien Rotaat [phone] option 3.

## 2020-03-21 NOTE — Progress Notes (Signed)
Cardiology Office Note:    Date:  03/21/2020   ID:  Kimberly Schroeder, DOB 1950-09-05, MRN [id number]  PCP:  Burnis Medin, MD  Cardiologist:  No primary care provider on file.  Electrophysiologist:  None   Referring MD: Burnis Medin, MD   Chief Complaint/Reason for Referral: Chest pain, elevated BP  History of Present Illness:    Kimberly Schroeder is a 69 y.o. female with a history of GERD, HLD who presents for evaluation of chest pain and newly elevated BP.   Over the past several weeks or so she has noticed a feeling of indigestion. She has a history of GERD. She did not think much of it initially, but after she contacted her PCP office they recommended urgent care evaluation. There, BP was elevated which is unusual for her - 169/94. Given mild abnormalities on ECG, she went to the ED. There she was ruled out for MI. Negative troponin and no ischemia on ECG. Sx felt consistent with GERD. GI cocktail mildly helpful.   Her chest pain is a really heavy feeling in the center of her chest, minutes at a time, sometimes associated with left shoulder pain. This is sometimes worsened by stress and being upset. Occasionally she can belch and relieve discomfort but that doesn't always happen. She took pepcid for 1 week BID, and it helped a little. When she has chest pain, she knows her BP will be elevated.  She has been having more fatigue, and feels like her legs are walking through water.   FHx significant for PGF with MI in 60s, he was a smoker. Her mother had an MI later in life and had end stage HF in her 62s.   Past Medical History:  Diagnosis Date  . Allergic rhinitis    allergist evaluation spring fall and dust.  . Ectopic pregnancy    x 2  . GERD (gastroesophageal reflux disease)   . Hematuria, microscopic    eval 2001  Dr Terance Hart    Past Surgical History:  Procedure Laterality Date  . LAPAROSCOPY     ectopic pregnancy  . LAPAROSCOPY FOR ECTOPIC PREGNANCY     x2     Current Medications: Current Meds  Medication Sig  . amLODipine (NORVASC) 2.5 MG tablet Take 1 tablet (2.5 mg total) by mouth daily. For blood pressusre  . cetirizine (ZYRTEC) 10 MG tablet Take 10 mg by mouth daily.    . Cholecalciferol (VITAMIN D3 MAXIMUM STRENGTH) 125 MCG (5000 UT) capsule Take 5,000 Units by mouth in the morning and at bedtime. With vitamin K and minerals   . EPIPEN 2-PAK 0.3 MG/0.3ML SOAJ injection USE AS DIRECTED AS NEEDED  . Estradiol (VAGIFEM) 10 MCG TABS Place 1 each vaginally once a week.   . fish oil-omega-3 fatty acids 1000 MG capsule Take 2 g by mouth daily.    . fluticasone (FLONASE) 50 MCG/ACT nasal spray Place 1 spray into both nostrils daily.  Marland Kitchen omeprazole (PRILOSEC) 20 MG capsule Take 1 capsule (20 mg total) by mouth daily.  . psyllium (METAMUCIL) 58.6 % packet Take 1 packet by mouth every evening.    Current Facility-Administered Medications for the 03/21/20 encounter (Office Visit) with Elouise Munroe, MD  Medication  . 0.9 %  sodium chloride infusion     Allergies:   Iohexol   Social History   Tobacco Use  . Smoking status: Never Smoker  . Smokeless tobacco: Never Used  Vaping Use  . Vaping Use: Never  used  Substance Use Topics  . Alcohol use: Yes    Comment: occasional wine  . Drug use: No     Family History: The patient's family history includes Arthritis in an other family member; Colon cancer in her paternal uncle; Colon cancer (age of onset: 52) in her father; Hypertension in an other family member; Parkinsonism in an other family member.  ROS:   Please see the history of present illness.    All other systems reviewed and are negative.  EKGs/Labs/Other Studies Reviewed:    The following studies were reviewed today:  EKG:  NSR, possible LVH.   Recent Labs: 10/17/2019: ALT 15 03/06/2020: BUN 14; Creatinine, Ser 1.10; Hemoglobin 14.3; Platelets 354; Potassium 3.7; Sodium 141  Recent Lipid Panel    Component Value  Date/Time   CHOL 196 10/17/2019 0830   TRIG 102.0 10/17/2019 0830   TRIG 99 04/11/2006 0806   HDL 56.30 10/17/2019 0830   CHOLHDL 3 10/17/2019 0830   VLDL 20.4 10/17/2019 0830   LDLCALC 119 (H) 10/17/2019 0830   LDLDIRECT 132.6 04/16/2011 0930    Physical Exam:    VS:  BP 140/80 (BP Location: Left Arm, Patient Position: Sitting)   Pulse 86   Ht _0  (1.549 m)   Wt 152 lb 6.4 oz (69.1 kg)   SpO2 98%   BMI 28.80 kg/m     Wt Readings from Last 5 Encounters:  03/21/20 152 lb 6.4 oz (69.1 kg)  03/17/20 152 lb (68.9 kg)  03/10/20 150 lb (68 kg)  03/06/20 150 lb (68 kg)  10/12/19 153 lb 6.4 oz (69.6 kg)    Constitutional: No acute distress Eyes: sclera non-icteric, normal conjunctiva and lids ENMT: normal dentition, moist mucous membranes Cardiovascular: regular rhythm, normal rate, no murmurs. S1 and S2 normal. Radial pulses normal bilaterally. No jugular venous distention.  Respiratory: clear to auscultation bilaterally GI : normal bowel sounds, soft and nontender. No distention.   MSK: extremities warm, well perfused. No edema.  NEURO: grossly nonfocal exam, moves all extremities. PSYCH: alert and oriented x 3, normal mood and affect.   ASSESSMENT:    1. Hypertension, unspecified type   2. Precordial pain    PLAN:    Precordial pain - Plan: EKG 12-Lead, ECHOCARDIOGRAM COMPLETE, CTA CORONARY  -Chest pain is concerning in setting of HTN and HLD. No DM, nonsmoker. Intermediate risk for CAD, will proceed with CCTA. We also discussed obtaining an echocardiogram for chest pain and workup for secondary causes of HTN.  Hypertension, unspecified type - Plan: ECHOCARDIOGRAM COMPLETE, VAS US RENAL ARTERY DUPLEX, Basic metabolic panel - new HTN, doing well on amlodipine so far. Will not make changes since she just started this medicine a few days ago. She will continue to keep home BP log.  - will check renal u/s and echo for secondary causes of HTN.   Pure  hypercholesterolemia - recommend diet and lifestyle modification. If evidence of CAD on CCTA, will recommend statin and aspirin.   Total time of encounter: 60 minutes total time of encounter, including 35 minutes spent in face-to-face patient care on the date of this encounter. This time includes coordination of care and counseling regarding above mentioned problem list. Remainder of non-face-to-face time involved reviewing chart documents/testing relevant to the patient encounter and documentation in the medical record. I have independently reviewed documentation from referring provider.   Cherlynn Kaiser, MD [place]  [facility]    Medication Adjustments/Labs and Tests Ordered: Current medicines are reviewed  at length with the patient today.  Concerns regarding medicines are outlined above.   Orders Placed This Encounter  Procedures  . CT CORONARY MORPH W/CTA COR W/SCORE W/CA W/CM &/OR WO/CM  . CT CORONARY FRACTIONAL FLOW RESERVE DATA PREP  . CT CORONARY FRACTIONAL FLOW RESERVE FLUID ANALYSIS  . Basic metabolic panel  . EKG 12-Lead  . ECHOCARDIOGRAM COMPLETE  . VAS US RENAL ARTERY DUPLEX    Meds ordered this encounter  Medications  . predniSONE (DELTASONE) 50 MG tablet    Sig: Take 1 Tablet every 6 hours starting the night before the CT. Take last dose before you leave for the test.    Dispense:  3 tablet    Refill:  0  . diphenhydrAMINE (BENADRYL) 50 MG tablet    Sig: Take with your last prednisone tablet  before leaving for test.    Dispense:  1 tablet    Refill:  0  . metoprolol tartrate (LOPRESSOR) 100 MG tablet    Sig: Take 1 tablet (100 mg total) by mouth once for 1 dose. PLEASE TAKE 2 HOURS PRIOR TO CTA SCAN    Dispense:  1 tablet    Refill:  0    Patient Instructions  Medication Instructions:  No Changes In Medications at this time.  *If you need a refill on your cardiac medications before your next appointment, please call your pharmacy*  Lab  Work: BMET- 1 week prior to CTA scan  If you have labs (blood work) drawn today and your tests are completely normal, you will receive your results only by: Marland Kitchen MyChart Message (if you have MyChart) OR . A paper copy in the mail If you have any lab test that is abnormal or we need to change your treatment, we will call you to review the results.  Testing/Procedures: Your physician has requested that you have a renal artery duplex. During this test, an ultrasound is used to evaluate blood flow to the kidneys. Allow one hour for this exam. Do not eat after midnight the day before and avoid carbonated beverages. Take your medications as you usually do.  Your physician has requested that you have an echocardiogram. Echocardiography is a painless test that uses sound waves to create images of your heart. It provides your doctor with information about the size and shape of your heart and how well your heart's chambers and valves are working. You may receive an ultrasound enhancing agent through an IV if needed to better visualize your heart during the echo.This procedure takes approximately one hour. There are no restrictions for this procedure. This will take place at the [address].    Your physician has requested that you have cardiac CT. Cardiac computed tomography (CT) is a painless test that uses an x-ray machine to take clear, detailed pictures of your heart. For further information please visit [facility]. Please follow instruction sheet as given.  Follow-Up: At [facility], you and your health needs are our priority.  As part of our continuing mission to provide you with exceptional heart care, we have created designated Provider Care Teams.  These Care Teams include your primary Cardiologist (physician) and Advanced Practice Providers (APPs -  Physician Assistants and Nurse Practitioners) who all work together to provide you with the care you need, when you need  it.  Your next appointment:   2 month(s)  The format for your next appointment:   In Person  Provider:   Cherlynn Kaiser, MD  Other Instructions Your cardiac CT will be scheduled at one of the below locations:   [facility] [address] [place], [place] [address] [phone]  If scheduled at [facility], please arrive at the [facility] main entrance of [facility] 30 minutes prior to test start time. Proceed to the [facility] Radiology Department (first floor) to check-in and test prep.  Please follow these instructions carefully (unless otherwise directed):  On the Night Before the Test: . Be sure to Drink plenty of water. . Do not consume any caffeinated/decaffeinated beverages or chocolate 12 hours prior to your test. . Do not take any antihistamines 12 hours prior to your test. . If the patient has contrast allergy: ? Patient will need a prescription for Prednisone and very clear instructions (as follows): 1. Prednisone 50 mg - take 13 hours prior to test 2. Take another Prednisone 50 mg 7 hours prior to test 3. Take another Prednisone 50 mg 1 hour prior to test 4. Take Benadryl 50 mg 1 hour prior to test . Patient must complete all four doses of above prophylactic medications. . Patient will need a ride after test due to Benadryl.  On the Day of the Test: . Drink plenty of water. Do not drink any water within one hour of the test. . Do not eat any food 4 hours prior to the test. . You may take your regular medications prior to the test.  . Take metoprolol (Lopressor) 169m (1 tablet) two hours prior to test. . HOLD Furosemide/Hydrochlorothiazide morning of the test. . FEMALES- please wear underwire-free bra if available After the Test: . Drink plenty of water. . After receiving IV contrast, you may experience a mild flushed feeling. This is normal. . On occasion, you may experience a mild rash up to 24 hours after the test. This is  not dangerous. If this occurs, you can take Benadryl 25 mg and increase your fluid intake. . If you experience trouble breathing, this can be serious. If it is severe call 911 IMMEDIATELY. If it is mild, please call our office. . If you take any of these medications: Glipizide/Metformin, Avandament, Glucavance, please do not take 48 hours after completing test unless otherwise instructed.  Once we have confirmed authorization from your insurance company, we will call you to set up a date and time for your test. Based on how quickly your insurance processes prior authorizations requests, please allow up to 4 weeks to be contacted for scheduling your Cardiac CT appointment. Be advised that routine Cardiac CT appointments could be scheduled as many as 8 weeks after your provider has ordered it.  For non-scheduling related questions, please contact the cardiac imaging nurse navigator should you have any questions/concerns: SMarchia Bond Cardiac Imaging Nurse Navigator MBurley Saver Interim Cardiac Imaging Nurse [facility] Direct Office Dial: [phone]  For scheduling needs, including cancellations and rescheduling, please call TVivien Rotaat [phone] option [phone].

## 2020-03-29 ENCOUNTER — Ambulatory Visit: Payer: Medicare HMO

## 2020-03-31 DIAGNOSIS — J301 Allergic rhinitis due to pollen: Secondary | ICD-10-CM | POA: Diagnosis not present

## 2020-03-31 DIAGNOSIS — J3089 Other allergic rhinitis: Secondary | ICD-10-CM | POA: Diagnosis not present

## 2020-03-31 DIAGNOSIS — J3081 Allergic rhinitis due to animal (cat) (dog) hair and dander: Secondary | ICD-10-CM | POA: Diagnosis not present

## 2020-04-01 ENCOUNTER — Other Ambulatory Visit: Payer: Self-pay | Admitting: Family Medicine

## 2020-04-01 DIAGNOSIS — J069 Acute upper respiratory infection, unspecified: Secondary | ICD-10-CM

## 2020-04-02 ENCOUNTER — Inpatient Hospital Stay: Payer: Medicare HMO | Admitting: Internal Medicine

## 2020-04-07 ENCOUNTER — Other Ambulatory Visit: Payer: Self-pay

## 2020-04-07 ENCOUNTER — Ambulatory Visit (HOSPITAL_COMMUNITY)
Admission: RE | Admit: 2020-04-07 | Discharge: 2020-04-07 | Disposition: A | Discharge status: Home / Self Care | Payer: Medicare HMO | Source: Ambulatory Visit | Attending: Internal Medicine | Admitting: Internal Medicine

## 2020-04-07 DIAGNOSIS — I10 Essential (primary) hypertension: Secondary | ICD-10-CM | POA: Diagnosis not present

## 2020-04-08 ENCOUNTER — Other Ambulatory Visit: Payer: Self-pay | Admitting: Internal Medicine

## 2020-04-14 ENCOUNTER — Other Ambulatory Visit: Payer: Self-pay

## 2020-04-14 ENCOUNTER — Ambulatory Visit (HOSPITAL_COMMUNITY): Payer: Medicare HMO | Attending: Cardiology

## 2020-04-14 DIAGNOSIS — I10 Essential (primary) hypertension: Secondary | ICD-10-CM

## 2020-04-14 DIAGNOSIS — R072 Precordial pain: Secondary | ICD-10-CM

## 2020-04-14 LAB — ECHOCARDIOGRAM COMPLETE
Area-P 1/2: 3.03 cm2
S' Lateral: 2.5 cm

## 2020-04-15 ENCOUNTER — Ambulatory Visit: Payer: Medicare HMO | Admitting: Gastroenterology

## 2020-04-15 ENCOUNTER — Encounter: Payer: Self-pay | Admitting: Gastroenterology

## 2020-04-15 VITALS — BP 120/70 | HR 84 | Ht 60.75 in | Wt 152.0 lb

## 2020-04-15 DIAGNOSIS — R142 Eructation: Secondary | ICD-10-CM

## 2020-04-15 DIAGNOSIS — R0789 Other chest pain: Secondary | ICD-10-CM

## 2020-04-15 LAB — BASIC METABOLIC PANEL
BUN/Creatinine Ratio: 17 (ref 12–28)
BUN: 17 mg/dL (ref 8–27)
CO2: 27 mmol/L (ref 20–29)
Calcium: 10 mg/dL (ref 8.7–10.3)
Chloride: 104 mmol/L (ref 96–106)
Creatinine, Ser: 0.98 mg/dL (ref 0.57–1.00)
GFR calc Af Amer: 69 mL/min/{1.73_m2} (ref >59)
GFR calc non Af Amer: 60 mL/min/{1.73_m2} (ref >59)
Glucose: 101 mg/dL — ABNORMAL HIGH (ref 65–99)
Potassium: 5 mmol/L (ref 3.5–5.2)
Sodium: 142 mmol/L (ref 134–144)

## 2020-04-15 MED ORDER — OMEPRAZOLE 20 MG PO CPDR
20.0000 mg | DELAYED_RELEASE_CAPSULE | Freq: Two times a day (BID) | ORAL | 3 refills | Status: DC
Start: 2020-04-15 — End: 2020-05-06

## 2020-04-15 NOTE — Patient Instructions (Addendum)
If you are age 67 or older, your body mass index should be between 23-30. Your Body mass index is 28.96 kg/m. If this is out of the aforementioned range listed, please consider follow up with your Primary Care Provider.  If you are age 25 or younger, your body mass index should be between 19-25. Your Body mass index is 28.96 kg/m. If this is out of the aformentioned range listed, please consider follow up with your Primary Care Provider.   We have sent the following medications to your pharmacy for you to pick up at your convenience: Omeprazole 20 mg twice daily.  You have been scheduled for an endoscopy. Please follow written instructions given to you at your visit today. If you use inhalers (even only as needed), please bring them with you on the day of your procedure.  Due to recent changes in healthcare laws, you may see the results of your imaging and laboratory studies on MyChart before your provider has had a chance to review them.  We understand that in some cases there may be results that are confusing or concerning to you. Not all laboratory results come back in the same time frame and the provider may be waiting for multiple results in order to interpret others.  Please give [place] 48 hours in order for your provider to thoroughly review all the results before contacting the office for clarification of your results.

## 2020-04-15 NOTE — Progress Notes (Signed)
04/15/2020 Kimberly Schroeder [id number] May 29, 1950   HISTORY OF PRESENT ILLNESS: This is a 69 year old female who is a patient of Dr. Woodward Ku.  She is here today for an ER follow-up of complaints of chest pain.  She was in the ER back in October for this issue and says that it had been going on for a couple of weeks.  She described it as a pressure.  Was not exactly constant, but very frequent.  She did notice it mostly after she would eat, etc.  She went to urgent care and then they sent her to the ER.  EKG and troponins were okay.  She was already taking Pepcid so they added Carafate.  She also received GI cocktail in the ER.  Then she followed-up with her PCP on November 1 and they recommended that she replace her Pepcid with omeprazole 20 mg daily.  She says that she has definitely improved, but does still have gas discomfort and belching at times.  She does not notice any type of burning sensation.  She denies any dysphagia.  She denies any abdominal pain.  She has had EGD in the past, but the last was in 2002.    Past Medical History:  Diagnosis Date  . Allergic rhinitis    allergist evaluation spring fall and dust.  . Ectopic pregnancy    x 2  . GERD (gastroesophageal reflux disease)   . Hematuria, microscopic    eval 2001  Dr Terance Hart   Past Surgical History:  Procedure Laterality Date  . LAPAROSCOPY     ectopic pregnancy  . LAPAROSCOPY FOR ECTOPIC PREGNANCY     x2    reports that she has never smoked. She has never used smokeless tobacco. She reports current alcohol use. She reports that she does not use drugs. family history includes Arthritis in an other family member; Colon cancer in her paternal uncle; Colon cancer (age of onset: 37) in her father; Hypertension in an other family member; Parkinsonism in an other family member. Allergies  Allergen Reactions  . Iohexol Hives    IVP DYE      Outpatient Encounter Medications as of 04/15/2020  Medication Sig  .  amLODipine (NORVASC) 2.5 MG tablet Take 1 tablet (2.5 mg total) by mouth daily. For blood pressusre  . cetirizine (ZYRTEC) 10 MG tablet Take 10 mg by mouth daily.    . Cholecalciferol (VITAMIN D3 MAXIMUM STRENGTH) 125 MCG (5000 UT) capsule Take 5,000 Units by mouth in the morning and at bedtime. With vitamin K and minerals   . diphenhydrAMINE (BENADRYL) 50 MG tablet Take with your last prednisone tablet  before leaving for test.  . EPIPEN 2-PAK 0.3 MG/0.3ML SOAJ injection USE AS DIRECTED AS NEEDED  . Estradiol (VAGIFEM) 10 MCG TABS Place 1 each vaginally once a week.   . fish oil-omega-3 fatty acids 1000 MG capsule Take 2 g by mouth daily.    . fluticasone (FLONASE) 50 MCG/ACT nasal spray SPRAY 1 SPRAY INTO BOTH NOSTRILS DAILY.  Marland Kitchen omeprazole (PRILOSEC) 20 MG capsule Take 1 capsule (20 mg total) by mouth daily.  . predniSONE (DELTASONE) 50 MG tablet Take 1 Tablet every 6 hours starting the night before the CT. Take last dose before you leave for the test.  . psyllium (METAMUCIL) 58.6 % packet Take 1 packet by mouth every evening.   . metoprolol tartrate (LOPRESSOR) 100 MG tablet Take 1 tablet (100 mg total) by mouth once for 1 dose. PLEASE TAKE  2 HOURS PRIOR TO CTA SCAN  . [DISCONTINUED] famotidine (PEPCID) 20 MG tablet Take 20 mg by mouth 2 (two) times daily.   Facility-Administered Encounter Medications as of 04/15/2020  Medication  . 0.9 %  sodium chloride infusion    REVIEW OF SYSTEMS  : All other systems reviewed and negative except where noted in the History of Present Illness.   PHYSICAL EXAM: BP 120/70   Pulse 84   Ht 5' 0.75" (1.543 m)   Wt 152 lb (68.9 kg)   BMI 28.96 kg/m  General: Well developed white female in no acute distress Head: Normocephalic and atraumatic Eyes:  Sclerae anicteric, conjunctiva pink. Ears: Normal auditory acuity Lungs: Clear throughout to auscultation; no W/R/R. Heart: Regular rate and rhythm; no M/R/G. Abdomen: Soft, non-distended.  BS present.   Non-tender. Musculoskeletal: Symmetrical with no gross deformities. Skin: No lesions on visible extremities Extremities: No edema  Neurological: Alert oriented x 4, grossly. Psychological:  Alert and cooperative. Normal mood and affect  ASSESSMENT AND PLAN: *Atypical chest pain, belching:  Much improved on omeprazole 20 mg daily, but still with some gas/belching.  Had some cardiac work-up with an echo yesterday and then has a cardiac CT scheduled next week.  So far evaluation has been unremarkable.  We will increase omeprazole to 20 mg twice daily for a period of time to see if this helps with her symptoms further.  New prescription was sent to her pharmacy.  We will plan for EGD as well with Dr. Silverio Decamp.  The risks, benefits, and alternatives to EGD were discussed with the patient and she consents to proceed.   CC:  Panosh, Standley Brooking, MD

## 2020-04-17 ENCOUNTER — Telehealth (HOSPITAL_COMMUNITY): Payer: Self-pay | Admitting: Emergency Medicine

## 2020-04-17 NOTE — Telephone Encounter (Signed)
Attempted to call patient regarding upcoming cardiac CT appointment. °Left message on voicemail with name and callback number °Giovonnie Trettel RN Navigator Cardiac Imaging °[facility] °[phone] Office °[phone] Cell ° °

## 2020-04-17 NOTE — Telephone Encounter (Signed)
Pt returning phone call regarding upcoming cardiac imaging study; pt verbalizes understanding of appt date/time, parking situation and where to check in, pre-test NPO status and medications ordered, and verified current allergies; name and call back number provided for further questions should they arise Marchia Bond RN Navigator Cardiac Imaging [facility] [phone] office [phone] cell   Pt instructed to take 50mg  prednisone (11:15p the night prior to scan) 50mg  prednisone (5:15a day of scan) 50mg  prednisone (11:15a day of scan) + 50mg  benadryl (11:15a day of scan)  Pt instructed to have a ride to and from appt  Pt also instructed to take 100mg  metoprolol tartrate 2 hr prior to scan. Holding zyrtec and amlodipine. Clarise Cruz

## 2020-04-17 NOTE — Telephone Encounter (Signed)
Attempted to call patient regarding upcoming cardiac CT appointment. °Left message on voicemail with name and callback number °Sharone Picchi RN Navigator Cardiac Imaging °[facility] °[phone] Office °[phone] Cell ° °

## 2020-04-18 ENCOUNTER — Telehealth: Payer: Self-pay | Admitting: *Deleted

## 2020-04-18 NOTE — Telephone Encounter (Signed)
PA submitted and approved for Omeprazole 20 mg twice daily. Faxed approval to pharmacy.

## 2020-04-21 ENCOUNTER — Other Ambulatory Visit: Payer: Self-pay

## 2020-04-21 ENCOUNTER — Ambulatory Visit (HOSPITAL_COMMUNITY)
Admission: RE | Admit: 2020-04-21 | Discharge: 2020-04-21 | Disposition: A | Discharge status: Home / Self Care | Payer: Medicare HMO | Source: Ambulatory Visit | Attending: Internal Medicine | Admitting: Internal Medicine

## 2020-04-21 ENCOUNTER — Encounter: Payer: Medicare HMO | Admitting: *Deleted

## 2020-04-21 DIAGNOSIS — R072 Precordial pain: Secondary | ICD-10-CM

## 2020-04-21 DIAGNOSIS — Z006 Encounter for examination for normal comparison and control in clinical research program: Secondary | ICD-10-CM

## 2020-04-21 MED ORDER — IOHEXOL 350 MG/ML SOLN
80.0000 mL | Freq: Once | INTRAVENOUS | Status: AC | PRN
  Administered 2020-04-21: 80 mL via INTRAVENOUS

## 2020-04-21 MED ORDER — METOPROLOL TARTRATE 5 MG/5ML IV SOLN
INTRAVENOUS | Status: AC
  Filled 2020-04-21: qty 5

## 2020-04-21 MED ORDER — NITROGLYCERIN 0.4 MG SL SUBL
0.8000 mg | SUBLINGUAL_TABLET | Freq: Once | SUBLINGUAL | Status: AC
  Administered 2020-04-21: 0.8 mg via SUBLINGUAL

## 2020-04-21 MED ORDER — METOPROLOL TARTRATE 5 MG/5ML IV SOLN
5.0000 mg | INTRAVENOUS | Status: DC | PRN
  Administered 2020-04-21: 5 mg via INTRAVENOUS

## 2020-04-21 MED ORDER — NITROGLYCERIN 0.4 MG SL SUBL
SUBLINGUAL_TABLET | SUBLINGUAL | Status: AC
  Filled 2020-04-21: qty 2

## 2020-04-21 NOTE — Research (Signed)
Subject Name: Kimberly Schroeder  Subject met inclusion and exclusion criteria.  The informed consent form, study requirements and expectations were reviewed with the subject and questions and concerns were addressed prior to the signing of the consent form.  The subject verbalized understanding of the trial requirements.  The subject agreed to participate in the IDENTIFY trial and signed the informed consent at 1128 on 04/21/20  The informed consent was obtained prior to performance of any protocol-specific procedures for the subject.  A copy of the signed informed consent was given to the subject and a copy was placed in the subject's medical record.   Timoteo Gaul

## 2020-04-21 NOTE — Progress Notes (Signed)
Took Metoprolol 100 mg and Prednisone and Benedryl pre med for Iohexol allergy as ordered.

## 2020-04-23 NOTE — Progress Notes (Signed)
Reviewed and agree with documentation and assessment and plan. K. Veena Donnah Levert , MD   

## 2020-04-25 ENCOUNTER — Other Ambulatory Visit: Payer: Self-pay

## 2020-04-25 ENCOUNTER — Other Ambulatory Visit: Payer: Self-pay | Admitting: Family Medicine

## 2020-04-25 ENCOUNTER — Encounter: Payer: Self-pay | Admitting: Gastroenterology

## 2020-04-25 ENCOUNTER — Ambulatory Visit (AMBULATORY_SURGERY_CENTER): Payer: Medicare HMO | Admitting: Gastroenterology

## 2020-04-25 VITALS — BP 134/65 | HR 64 | Temp 98.4°F | Resp 12 | Ht 60.75 in | Wt 152.0 lb

## 2020-04-25 DIAGNOSIS — R12 Heartburn: Secondary | ICD-10-CM

## 2020-04-25 DIAGNOSIS — R0789 Other chest pain: Secondary | ICD-10-CM | POA: Diagnosis not present

## 2020-04-25 DIAGNOSIS — R142 Eructation: Secondary | ICD-10-CM | POA: Diagnosis not present

## 2020-04-25 DIAGNOSIS — J069 Acute upper respiratory infection, unspecified: Secondary | ICD-10-CM

## 2020-04-25 DIAGNOSIS — K2950 Unspecified chronic gastritis without bleeding: Secondary | ICD-10-CM | POA: Diagnosis not present

## 2020-04-25 DIAGNOSIS — K2970 Gastritis, unspecified, without bleeding: Secondary | ICD-10-CM | POA: Diagnosis not present

## 2020-04-25 DIAGNOSIS — K219 Gastro-esophageal reflux disease without esophagitis: Secondary | ICD-10-CM

## 2020-04-25 MED ORDER — SODIUM CHLORIDE 0.9 % IV SOLN: 500.0000 mL | Freq: Once | INTRAVENOUS | Status: DC

## 2020-04-25 NOTE — Patient Instructions (Signed)
Handouts given:  Antireflux regimen Resume previous diet Continue current medications Follow anti-reflux regimen Await pathology results Start using omeprazole 20mg  by mouth twice daily.  ( take with water only and 30 min before eating.)  YOU HAD AN ENDOSCOPIC PROCEDURE TODAY AT [facility]:   Refer to the procedure report that was given to you for any specific questions about what was found during the examination.  If the procedure report does not answer your questions, please call your gastroenterologist to clarify.  If you requested that your care partner not be given the details of your procedure findings, then the procedure report has been included in a sealed envelope for you to review at your convenience later.  YOU SHOULD EXPECT: Some feelings of bloating in the abdomen. Passage of more gas than usual.  Walking can help get rid of the air that was put into your GI tract during the procedure and reduce the bloating. If you had a lower endoscopy (such as a colonoscopy or flexible sigmoidoscopy) you may notice spotting of blood in your stool or on the toilet paper. If you underwent a bowel prep for your procedure, you may not have a normal bowel movement for a few days.  Please Note:  You might notice some irritation and congestion in your nose or some drainage.  This is from the oxygen used during your procedure.  There is no need for concern and it should clear up in a day or so.  SYMPTOMS TO REPORT IMMEDIATELY:   Following upper endoscopy (EGD)  Vomiting of blood or coffee ground material  New chest pain or pain under the shoulder blades  Painful or persistently difficult swallowing  New shortness of breath  Fever of 100F or higher  Black, tarry-looking stools  For urgent or emergent issues, a gastroenterologist can be reached at any hour by calling [phone]. Do not use MyChart messaging for urgent concerns.   DIET:  We do recommend a small meal at first,  but then you may proceed to your regular diet.  Drink plenty of fluids but you should avoid alcoholic beverages for 24 hours.  ACTIVITY:  You should plan to take it easy for the rest of today and you should NOT DRIVE or use heavy machinery until tomorrow (because of the sedation medicines used during the test).    FOLLOW UP: Our staff will call the number listed on your records 48-72 hours following your procedure to check on you and address any questions or concerns that you may have regarding the information given to you following your procedure. If we do not reach you, we will leave a message.  We will attempt to reach you two times.  During this call, we will ask if you have developed any symptoms of COVID 19. If you develop any symptoms (ie: fever, flu-like symptoms, shortness of breath, cough etc.) before then, please call [phone].  If you test positive for Covid 19 in the 2 weeks post procedure, please call and report this information to [place].    If any biopsies were taken you will be contacted by phone or by letter within the next 1-3 weeks.  Please call us at [phone] if you have not heard about the biopsies in 3 weeks.   SIGNATURES/CONFIDENTIALITY: You and/or your care partner have signed paperwork which will be entered into your electronic medical record.  These signatures attest to the fact that that the information above on your After Visit Summary has  been reviewed and is understood.  Full responsibility of the confidentiality of this discharge information lies with you and/or your care-partner.

## 2020-04-25 NOTE — Progress Notes (Signed)
Medical history reviewed with no changes noted. VS assessed by C.W 

## 2020-04-25 NOTE — Op Note (Signed)
[facility] Patient Name: Kimberly Schroeder Procedure Date: 04/25/2020 3:58 PM MRN: [id number] Endoscopist: Mauri Pole , MD Age: 69 Referring MD:  Date of Birth: 09-Aug-1950 Gender: Female Account #: [id number] Procedure:                Upper GI endoscopy Indications:              Heartburn, Suspected esophageal reflux, Chest pain                            (non cardiac) Medicines:                Monitored Anesthesia Care Procedure:                Pre-Anesthesia Assessment:                           - Prior to the procedure, a History and Physical                            was performed, and patient medications and                            allergies were reviewed. The patient's tolerance of                            previous anesthesia was also reviewed. The risks                            and benefits of the procedure and the sedation                            options and risks were discussed with the patient.                            All questions were answered, and informed consent                            was obtained. Prior Anticoagulants: The patient has                            taken no previous anticoagulant or antiplatelet                            agents. ASA Grade Assessment: II - A patient with                            mild systemic disease. After reviewing the risks                            and benefits, the patient was deemed in                            satisfactory condition to undergo the procedure.  After obtaining informed consent, the endoscope was                            passed under direct vision. Throughout the                            procedure, the patient's blood pressure, pulse, and                            oxygen saturations were monitored continuously. The                            Endoscope was introduced through the mouth, and                            advanced to the second part of  duodenum. The upper                            GI endoscopy was accomplished without difficulty.                            The patient tolerated the procedure well. Scope In: Scope Out: Findings:                 The Z-line was regular and was found 35 cm from the                            incisors.                           The examined esophagus was normal.                           The gastroesophageal flap valve was visualized                            endoscopically and classified as Hill Grade II                            (fold present, opens with respiration).                           The entire examined stomach was normal. Biopsies                            were taken with a cold forceps for Helicobacter                            pylori testing.                           The examined duodenum was normal.                           The cardia and gastric fundus were normal on  retroflexion. Complications:            No immediate complications. Estimated Blood Loss:     Estimated blood loss: none. Impression:               - Z-line regular, 35 cm from the incisors.                           - Normal esophagus.                           - Gastroesophageal flap valve classified as Hill                            Grade II (fold present, opens with respiration).                           - Normal stomach. Biopsied.                           - Normal examined duodenum. Recommendation:           - Patient has a contact number available for                            emergencies. The signs and symptoms of potential                            delayed complications were discussed with the                            patient. Return to normal activities tomorrow.                            Written discharge instructions were provided to the                            patient.                           - Resume previous diet.                           -  Continue present medications.                           - Await pathology results.                           - Follow an antireflux regimen.                           - Use Prilosec (omeprazole) 20 mg PO BID. Mauri Pole, MD 04/25/2020 4:18:26 PM This report has been signed electronically.

## 2020-04-25 NOTE — Progress Notes (Signed)
PT taken to PACU. Monitors in place. VSS. Report given to RN. 

## 2020-04-29 ENCOUNTER — Telehealth: Payer: Self-pay

## 2020-04-29 DIAGNOSIS — J3089 Other allergic rhinitis: Secondary | ICD-10-CM | POA: Diagnosis not present

## 2020-04-29 DIAGNOSIS — J3081 Allergic rhinitis due to animal (cat) (dog) hair and dander: Secondary | ICD-10-CM | POA: Diagnosis not present

## 2020-04-29 DIAGNOSIS — J301 Allergic rhinitis due to pollen: Secondary | ICD-10-CM | POA: Diagnosis not present

## 2020-04-29 NOTE — Telephone Encounter (Signed)
Attempted to reach patient for post-procedure f/u call. No answer. Left message that we will make another attempt to reach her later today and for her to please not hesitate to call us if she has any questions/concerns regarding her care.

## 2020-04-29 NOTE — Telephone Encounter (Signed)
Second attempt follow up call to pt, lm on vm. 

## 2020-05-03 ENCOUNTER — Other Ambulatory Visit: Payer: Self-pay | Admitting: Internal Medicine

## 2020-05-05 ENCOUNTER — Encounter: Payer: Self-pay | Admitting: Gastroenterology

## 2020-05-05 DIAGNOSIS — L821 Other seborrheic keratosis: Secondary | ICD-10-CM | POA: Diagnosis not present

## 2020-05-05 DIAGNOSIS — D224 Melanocytic nevi of scalp and neck: Secondary | ICD-10-CM | POA: Diagnosis not present

## 2020-05-05 DIAGNOSIS — D1801 Hemangioma of skin and subcutaneous tissue: Secondary | ICD-10-CM | POA: Diagnosis not present

## 2020-05-05 DIAGNOSIS — D2271 Melanocytic nevi of right lower limb, including hip: Secondary | ICD-10-CM | POA: Diagnosis not present

## 2020-05-05 DIAGNOSIS — D2272 Melanocytic nevi of left lower limb, including hip: Secondary | ICD-10-CM | POA: Diagnosis not present

## 2020-05-05 DIAGNOSIS — D225 Melanocytic nevi of trunk: Secondary | ICD-10-CM | POA: Diagnosis not present

## 2020-05-06 DIAGNOSIS — J3081 Allergic rhinitis due to animal (cat) (dog) hair and dander: Secondary | ICD-10-CM | POA: Diagnosis not present

## 2020-05-06 DIAGNOSIS — J301 Allergic rhinitis due to pollen: Secondary | ICD-10-CM | POA: Diagnosis not present

## 2020-05-06 DIAGNOSIS — J3089 Other allergic rhinitis: Secondary | ICD-10-CM | POA: Diagnosis not present

## 2020-05-07 ENCOUNTER — Other Ambulatory Visit: Payer: Self-pay | Admitting: Internal Medicine

## 2020-05-08 ENCOUNTER — Other Ambulatory Visit: Payer: Self-pay | Admitting: Family Medicine

## 2020-05-08 DIAGNOSIS — J069 Acute upper respiratory infection, unspecified: Secondary | ICD-10-CM

## 2020-05-26 NOTE — Progress Notes (Signed)
Chief Complaint  Patient presents with  . Follow-up    HPI: Kimberly Schroeder 70 y.o. come in for Chronic disease management  Last seen by me in November 21 for chest pain.  She has had evaluation with cardiology and GI  Nl cac score 0 Her PPI was increased to twice a day since that time she is so much better and not having chest pain or new symptoms. Blood pressure seems to be controlled on current situation. She does have a follow-up with cardiology. No new concerns seem to arise.   Now  42 of omeprazole  short term to see jhow works     Now rarely  Any issues  And if  In am  No cp in day or other .   Has fu cards     Echo   Lv mild   And ct  cac 0 .    No med changes   ROS: See pertinent positives and negatives per HPI.  Past Medical History:  Diagnosis Date  . Allergic rhinitis    allergist evaluation spring fall and dust.  . Ectopic pregnancy    x 2  . GERD (gastroesophageal reflux disease)   . Hematuria, microscopic    eval 2001  Dr Terance Hart    Family History  Problem Relation Age of Onset  . Colon cancer Father 43  . Colon cancer Paternal Uncle        76's  . Parkinsonism Other   . Arthritis Other   . Hypertension Other   . Pancreatic cancer Neg Hx   . Prostate cancer Neg Hx   . Rectal cancer Neg Hx   . Stomach cancer Neg Hx     Social History   Socioeconomic History  . Marital status: Married    Spouse name: Not on file  . Number of children: 0  . Years of education: Not on file  . Highest education level: Not on file  Occupational History  . Occupation: retired  Tobacco Use  . Smoking status: Never Smoker  . Smokeless tobacco: Never Used  Vaping Use  . Vaping Use: Never used  Substance and Sexual Activity  . Alcohol use: Yes    Comment: occasional wine  . Drug use: No  . Sexual activity: Not on file  Other Topics Concern  . Not on file  Social History Narrative   Married   HH of 2 and rescue dog   Exercises   G2   Husband  recovering from severe myocardiopathy viral and chf getting better will not need a heart transplant   Has had ileitis with  pouch   Husband  Retired    No ets.    caretraking  Family elederly.    Social Determinants of Health   Financial Resource Strain: Not on file  Food Insecurity: Not on file  Transportation Needs: Not on file  Physical Activity: Not on file  Stress: Not on file  Social Connections: Not on file    Outpatient Medications Prior to Visit  Medication Sig Dispense Refill  . amLODipine (NORVASC) 2.5 MG tablet TAKE 1 TABLET BY MOUTH DAILY. FOR BLOOD PRESSURE 30 tablet 1  . cetirizine (ZYRTEC) 10 MG tablet Take 10 mg by mouth daily.    . Cholecalciferol (VITAMIN D3 MAXIMUM STRENGTH) 125 MCG (5000 UT) capsule Take 5,000 Units by mouth in the morning and at bedtime. With vitamin K and minerals    . EPIPEN 2-PAK 0.3 MG/0.3ML SOAJ injection  1  . Estradiol 10 MCG TABS vaginal tablet Place 1 each vaginally once a week.    . fish oil-omega-3 fatty acids 1000 MG capsule Take 2 g by mouth daily.    . fluticasone (FLONASE) 50 MCG/ACT nasal spray INSTILL 1 SPRAY INTO BOTH NOSTRILS DAILY 48 mL 1  . omeprazole (PRILOSEC) 20 MG capsule TAKE 1 CAPSULE BY MOUTH EVERY DAY 30 capsule 1  . psyllium (METAMUCIL) 58.6 % packet Take 1 packet by mouth every evening.    . diphenhydrAMINE (BENADRYL) 50 MG tablet Take with your last prednisone tablet  before leaving for test. (Patient not taking: Reported on 04/25/2020) 1 tablet 0  . metoprolol tartrate (LOPRESSOR) 100 MG tablet Take 1 tablet (100 mg total) by mouth once for 1 dose. PLEASE TAKE 2 HOURS PRIOR TO CTA SCAN 1 tablet 0  . predniSONE (DELTASONE) 50 MG tablet Take 1 Tablet every 6 hours starting the night before the CT. Take last dose before you leave for the test. (Patient not taking: No sig reported) 3 tablet 0   No facility-administered medications prior to visit.     EXAM:  BP 110/80 (BP Location: Left Arm, Patient Position:  Sitting, Cuff Size: Normal)   Pulse 72   Temp 97.8 F (36.6 C) (Oral)   Ht 5' 0.75" (1.543 m)   Wt 151 lb 3.2 oz (68.6 kg)   SpO2 98%   BMI 28.80 kg/m   Body mass index is 28.8 kg/m.  GENERAL: vitals reviewed and listed above, alert, oriented, appears well hydrated and in no acute distress HEENT: atraumatic, conjunctiva  clear, no obvious abnormalities on inspection of external nose and ears OP :masked  NECK: no obvious masses on inspection palpation  LUNGS: clear to auscultation bilaterally, no wheezes, rales or rhonchi, good air movement CV: HRRR, no clubbing cyanosis or  peripheral edema nl cap refill  Abdomen:  Sof,t normal bowel sounds without hepatosplenomegaly, no guarding rebound or masses no CVA tenderness MS: moves all extremities without noticeable focal  abnormality PSYCH: pleasant and cooperative, no obvious depression or anxiety Lab Results  Component Value Date   WBC 7.0 03/06/2020   HGB 14.3 03/06/2020   HCT 43.5 03/06/2020   PLT 354 03/06/2020   GLUCOSE 101 (H) 04/14/2020   CHOL 196 10/17/2019   TRIG 102.0 10/17/2019   HDL 56.30 10/17/2019   LDLDIRECT 132.6 04/16/2011   LDLCALC 119 (H) 10/17/2019   ALT 15 10/17/2019   AST 16 10/17/2019   NA 142 04/14/2020   K 5.0 04/14/2020   CL 104 04/14/2020   CREATININE 0.98 04/14/2020   BUN 17 04/14/2020   CO2 27 04/14/2020   TSH 1.94 03/22/2016   HGBA1C 5.4 10/17/2019   BP Readings from Last 3 Encounters:  05/27/20 110/80  04/25/20 134/65  04/21/20 122/75   Wt Readings from Last 3 Encounters:  05/27/20 151 lb 3.2 oz (68.6 kg)  04/25/20 152 lb (68.9 kg)  04/15/20 152 lb (68.9 kg)   Record review  CAC score 0 ASSESSMENT AND PLAN:  Discussed the following assessment and plan:  Chest pain, unspecified type  Medication management  Elevated blood pressure reading  Gastroesophageal reflux disease, unspecified whether esophagitis present Improved symptoms are probably GI related After cardiology  follow-up reach out to the GI team about how long to stay on high dosing of the omeprazole and how to wean. Plan her yearly check in the summer follow-up if needed in interim. -Patient advised to return or notify health care team  if  new concerns arise.  Patient Instructions  Glad  you are doing better .    Plan yearly cpx in the summer  6 months .  Contact Gi about advice  On medication and  Weaning possibility of the high dose  Omeprazole.   Continue healthy eating.      Standley Brooking. Nakiyah Beverley M.D.

## 2020-05-27 ENCOUNTER — Ambulatory Visit: Payer: Medicare HMO | Admitting: Internal Medicine

## 2020-05-27 ENCOUNTER — Other Ambulatory Visit: Payer: Self-pay

## 2020-05-27 ENCOUNTER — Ambulatory Visit (INDEPENDENT_AMBULATORY_CARE_PROVIDER_SITE_OTHER): Payer: Medicare HMO | Admitting: Internal Medicine

## 2020-05-27 ENCOUNTER — Encounter: Payer: Self-pay | Admitting: Internal Medicine

## 2020-05-27 VITALS — BP 110/80 | HR 72 | Temp 97.8°F | Ht 60.75 in | Wt 151.2 lb

## 2020-05-27 DIAGNOSIS — R079 Chest pain, unspecified: Secondary | ICD-10-CM

## 2020-05-27 DIAGNOSIS — K219 Gastro-esophageal reflux disease without esophagitis: Secondary | ICD-10-CM

## 2020-05-27 DIAGNOSIS — R030 Elevated blood-pressure reading, without diagnosis of hypertension: Secondary | ICD-10-CM

## 2020-05-27 DIAGNOSIS — Z79899 Other long term (current) drug therapy: Secondary | ICD-10-CM | POA: Diagnosis not present

## 2020-05-27 NOTE — Patient Instructions (Signed)
Glad  you are doing better .    Plan yearly cpx in the summer  6 months .  Contact Gi about advice  On medication and  Weaning possibility of the high dose  Omeprazole.   Continue healthy eating.

## 2020-05-28 DIAGNOSIS — J3089 Other allergic rhinitis: Secondary | ICD-10-CM | POA: Diagnosis not present

## 2020-05-28 DIAGNOSIS — J3081 Allergic rhinitis due to animal (cat) (dog) hair and dander: Secondary | ICD-10-CM | POA: Diagnosis not present

## 2020-05-28 DIAGNOSIS — J301 Allergic rhinitis due to pollen: Secondary | ICD-10-CM | POA: Diagnosis not present

## 2020-06-03 ENCOUNTER — Encounter: Payer: Self-pay | Admitting: Gastroenterology

## 2020-06-03 ENCOUNTER — Other Ambulatory Visit: Payer: Self-pay | Admitting: Internal Medicine

## 2020-06-09 DIAGNOSIS — J3081 Allergic rhinitis due to animal (cat) (dog) hair and dander: Secondary | ICD-10-CM | POA: Diagnosis not present

## 2020-06-09 DIAGNOSIS — J301 Allergic rhinitis due to pollen: Secondary | ICD-10-CM | POA: Diagnosis not present

## 2020-06-09 DIAGNOSIS — J3089 Other allergic rhinitis: Secondary | ICD-10-CM | POA: Diagnosis not present

## 2020-06-20 ENCOUNTER — Encounter: Payer: Self-pay | Admitting: Internal Medicine

## 2020-06-20 ENCOUNTER — Other Ambulatory Visit: Payer: Self-pay

## 2020-06-20 ENCOUNTER — Ambulatory Visit: Payer: Medicare HMO | Admitting: Internal Medicine

## 2020-06-20 VITALS — BP 110/66 | HR 81 | Ht 61.0 in | Wt 150.0 lb

## 2020-06-20 DIAGNOSIS — R072 Precordial pain: Secondary | ICD-10-CM | POA: Diagnosis not present

## 2020-06-20 DIAGNOSIS — E7800 Pure hypercholesterolemia, unspecified: Secondary | ICD-10-CM | POA: Diagnosis not present

## 2020-06-20 DIAGNOSIS — I10 Essential (primary) hypertension: Secondary | ICD-10-CM

## 2020-06-20 NOTE — Patient Instructions (Signed)

## 2020-06-20 NOTE — Progress Notes (Signed)
Cardiology Office Note:    Date:  06/20/2020   ID:  Kimberly, Schroeder 1951/01/04, MRN [id number]  PCP:  Burnis Medin, MD  Cardiologist:  No primary care provider on file.  Electrophysiologist:  None   Referring MD: Burnis Medin, MD   Chief Complaint/Reason for Referral: Chest pain, elevated BP  History of Present Illness:    Kimberly Schroeder is a 70 y.o. female with a history of GERD, HLD who presents for follow up evaluation of chest pain and newly elevated BP.   We did a thorough evaluation all of which came back very reassuring. We discussed normal CCTA and normal renal ultrasound. Echo grossly normal with borderline LVH and G1DD. We discussed that these findings on echo are probably secondary to elevated BP. She is tolerating amlodipine 2.5 mg daily with normal BP today.   The patient denies chest pain, chest pressure, dyspnea at rest or with exertion, palpitations, PND, orthopnea, or leg swelling. Denies cough, fever, chills. Denies nausea, vomiting. Denies syncope or presyncope. Denies dizziness or lightheadedness.  Past Medical History:  Diagnosis Date  . Allergic rhinitis    allergist evaluation spring fall and dust.  . Ectopic pregnancy    x 2  . GERD (gastroesophageal reflux disease)   . Hematuria, microscopic    eval 2001  Dr Terance Hart    Past Surgical History:  Procedure Laterality Date  . LAPAROSCOPY     ectopic pregnancy  . LAPAROSCOPY FOR ECTOPIC PREGNANCY     x2    Current Medications: Current Meds  Medication Sig  . amLODipine (NORVASC) 2.5 MG tablet TAKE 1 TABLET BY MOUTH DAILY FOR BLOOD PRESSURE  . cetirizine (ZYRTEC) 10 MG tablet Take 10 mg by mouth daily.  . Cholecalciferol (VITAMIN D3 MAXIMUM STRENGTH) 125 MCG (5000 UT) capsule Take 5,000 Units by mouth in the morning and at bedtime. With vitamin K and minerals  . EPIPEN 2-PAK 0.3 MG/0.3ML SOAJ injection   . Estradiol 10 MCG TABS vaginal tablet Place 1 each vaginally once a week.  . fish  oil-omega-3 fatty acids 1000 MG capsule Take 2 g by mouth daily.  . fluticasone (FLONASE) 50 MCG/ACT nasal spray INSTILL 1 SPRAY INTO BOTH NOSTRILS DAILY  . omeprazole (PRILOSEC) 20 MG capsule TAKE 1 CAPSULE BY MOUTH EVERY DAY  . psyllium (METAMUCIL) 58.6 % packet Take 1 packet by mouth every evening.     Allergies:   Iohexol   Social History   Tobacco Use  . Smoking status: Never Smoker  . Smokeless tobacco: Never Used  Vaping Use  . Vaping Use: Never used  Substance Use Topics  . Alcohol use: Yes    Comment: occasional wine  . Drug use: No     Family History: The patient's family history includes Arthritis in an other family member; Colon cancer in her paternal uncle; Colon cancer (age of onset: 2) in her father; Hypertension in an other family member; Parkinsonism in an other family member. There is no history of Pancreatic cancer, Prostate cancer, Rectal cancer, or Stomach cancer.  ROS:   Please see the history of present illness.    All other systems reviewed and are negative.  EKGs/Labs/Other Studies Reviewed:    The following studies were reviewed today:  Recent Labs: 10/17/2019: ALT 15 03/06/2020: Hemoglobin 14.3; Platelets 354 04/14/2020: BUN 17; Creatinine, Ser 0.98; Potassium 5.0; Sodium 142  Recent Lipid Panel    Component Value Date/Time   CHOL 196 10/17/2019 0830   TRIG  102.0 10/17/2019 0830   TRIG 99 04/11/2006 0806   HDL 56.30 10/17/2019 0830   CHOLHDL 3 10/17/2019 0830   VLDL 20.4 10/17/2019 0830   LDLCALC 119 (H) 10/17/2019 0830   LDLDIRECT 132.6 04/16/2011 0930    Physical Exam:    VS:  BP 110/66   Pulse 81   Ht 5\' 1"  (1.549 m)   Wt 150 lb (68 kg)   SpO2 98%   BMI 28.34 kg/m     Wt Readings from Last 5 Encounters:  06/20/20 150 lb (68 kg)  05/27/20 151 lb 3.2 oz (68.6 kg)  04/25/20 152 lb (68.9 kg)  04/15/20 152 lb (68.9 kg)  03/21/20 152 lb 6.4 oz (69.1 kg)    Constitutional: No acute distress Eyes: sclera non-icteric, normal  conjunctiva and lids ENMT: normal dentition, moist mucous membranes Cardiovascular: regular rhythm, normal rate, no murmurs. S1 and S2 normal. Radial pulses normal bilaterally. No jugular venous distention.  Respiratory: clear to auscultation bilaterally GI : normal bowel sounds, soft and nontender. No distention.   MSK: extremities warm, well perfused. No edema.  NEURO: grossly nonfocal exam, moves all extremities. PSYCH: alert and oriented x 3, normal mood and affect.   ASSESSMENT:    1. Precordial pain   2. Hypertension, unspecified type   3. Pure hypercholesterolemia    PLAN:    Precordial pain - normal CCTA. Chest pain has resolved. Monitor symptoms.  Hypertension, unspecified type - BP well controlled on single low dose agent, continue amlodipine. Normal renal ultrasound.   Pure hypercholesterolemia - mildly elevated LDL 119 from 6/21. Repeat at follow up. Consider low dose statin if unable to modify with diet and exercise.   Exercise recommendations: Goal of exercising for at least 30 minutes a day, at least 5 times per week.  Please exercise to a moderate exertion.  This means that while exercising it is difficult to speak in full sentences, however you are not so short of breath that you feel you must stop, and not so comfortable that you can carry on a full conversation.  Exertion level should be approximately a 5/10, if 10 is the most exertion you can perform.  Diet recommendations: Recommend a heart healthy diet such as the Mediterranean diet.  This diet consists of plant based foods, healthy fats, lean meats, olive oil.  It suggests limiting the intake of simple carbohydrates such as white breads, pastries, and pastas.  It also limits the amount of red meat, wine, and dairy products such as cheese that one should consume on a daily basis.   Total time of encounter: 20 minutes total time of encounter, including 15 minutes spent in face-to-face patient care on the date of  this encounter. This time includes coordination of care and counseling regarding above mentioned problem list. Remainder of non-face-to-face time involved reviewing chart documents/testing relevant to the patient encounter and documentation in the medical record. I have independently reviewed documentation from referring provider.   Cherlynn Kaiser, MD [place]  [facility]    Medication Adjustments/Labs and Tests Ordered: Current medicines are reviewed at length with the patient today.  Concerns regarding medicines are outlined above.   No orders of the defined types were placed in this encounter.   No orders of the defined types were placed in this encounter.   Patient Instructions  Medication Instructions:  No Changes In Medications at this time.  *If you need a refill on your cardiac medications before your next appointment, please call your  pharmacy*  Follow-Up: At [facility], you and your health needs are our priority.  As part of our continuing mission to provide you with exceptional heart care, we have created designated Provider Care Teams.  These Care Teams include your primary Cardiologist (physician) and Advanced Practice Providers (APPs -  Physician Assistants and Nurse Practitioners) who all work together to provide you with the care you need, when you need it.  Your next appointment:   1 year(s)  The format for your next appointment:   In Person  Provider:   Cherlynn Kaiser, MD

## 2020-06-27 ENCOUNTER — Other Ambulatory Visit: Payer: Self-pay | Admitting: Internal Medicine

## 2020-06-30 DIAGNOSIS — J3081 Allergic rhinitis due to animal (cat) (dog) hair and dander: Secondary | ICD-10-CM | POA: Diagnosis not present

## 2020-06-30 DIAGNOSIS — J301 Allergic rhinitis due to pollen: Secondary | ICD-10-CM | POA: Diagnosis not present

## 2020-06-30 DIAGNOSIS — J3089 Other allergic rhinitis: Secondary | ICD-10-CM | POA: Diagnosis not present

## 2020-07-14 ENCOUNTER — Other Ambulatory Visit: Payer: Self-pay | Admitting: Gastroenterology

## 2020-07-14 ENCOUNTER — Telehealth: Payer: Self-pay | Admitting: Internal Medicine

## 2020-07-14 NOTE — Telephone Encounter (Signed)
Left message for patient to call back and schedule Medicare Annual Wellness Visit (AWV) either virtually or in office. No detailed message left   AWVI  please schedule at anytime with [facility] Nurse Health Advisor 1 or 2   This should be a 45 minute visit. 

## 2020-07-21 DIAGNOSIS — J3089 Other allergic rhinitis: Secondary | ICD-10-CM | POA: Diagnosis not present

## 2020-07-21 DIAGNOSIS — J301 Allergic rhinitis due to pollen: Secondary | ICD-10-CM | POA: Diagnosis not present

## 2020-07-21 DIAGNOSIS — J3081 Allergic rhinitis due to animal (cat) (dog) hair and dander: Secondary | ICD-10-CM | POA: Diagnosis not present

## 2020-07-22 ENCOUNTER — Other Ambulatory Visit: Payer: Self-pay | Admitting: Internal Medicine

## 2020-07-23 NOTE — Telephone Encounter (Signed)
No problem I will be happy to continue prescribing the amlodipine   Please make sure she has a prescription can send in a new 1 4 dispense 90 refill x1 if needed enough for 6 months.

## 2020-07-24 ENCOUNTER — Other Ambulatory Visit: Payer: Self-pay

## 2020-07-24 MED ORDER — AMLODIPINE BESYLATE 2.5 MG PO TABS: ORAL_TABLET | ORAL | 1 refills | Status: DC

## 2020-08-20 DIAGNOSIS — J301 Allergic rhinitis due to pollen: Secondary | ICD-10-CM | POA: Diagnosis not present

## 2020-08-20 DIAGNOSIS — J3089 Other allergic rhinitis: Secondary | ICD-10-CM | POA: Diagnosis not present

## 2020-08-20 DIAGNOSIS — J3081 Allergic rhinitis due to animal (cat) (dog) hair and dander: Secondary | ICD-10-CM | POA: Diagnosis not present

## 2020-09-02 DIAGNOSIS — J301 Allergic rhinitis due to pollen: Secondary | ICD-10-CM | POA: Diagnosis not present

## 2020-09-02 DIAGNOSIS — J3089 Other allergic rhinitis: Secondary | ICD-10-CM | POA: Diagnosis not present

## 2020-09-02 DIAGNOSIS — J3081 Allergic rhinitis due to animal (cat) (dog) hair and dander: Secondary | ICD-10-CM | POA: Diagnosis not present

## 2020-09-04 ENCOUNTER — Telehealth: Payer: Self-pay | Admitting: *Deleted

## 2020-09-04 DIAGNOSIS — Z006 Encounter for examination for normal comparison and control in clinical research program: Secondary | ICD-10-CM

## 2020-09-04 NOTE — Telephone Encounter (Signed)
I called patient for 90-day Identify Study phone call. Patient is doing well with no cardiac symptoms. I reminded patient I would call in December for 1 year follow-up.

## 2020-09-05 DIAGNOSIS — J301 Allergic rhinitis due to pollen: Secondary | ICD-10-CM | POA: Diagnosis not present

## 2020-09-05 DIAGNOSIS — J3089 Other allergic rhinitis: Secondary | ICD-10-CM | POA: Diagnosis not present

## 2020-09-05 DIAGNOSIS — J3081 Allergic rhinitis due to animal (cat) (dog) hair and dander: Secondary | ICD-10-CM | POA: Diagnosis not present

## 2020-09-18 DIAGNOSIS — J3089 Other allergic rhinitis: Secondary | ICD-10-CM | POA: Diagnosis not present

## 2020-09-18 DIAGNOSIS — J3081 Allergic rhinitis due to animal (cat) (dog) hair and dander: Secondary | ICD-10-CM | POA: Diagnosis not present

## 2020-09-18 DIAGNOSIS — J301 Allergic rhinitis due to pollen: Secondary | ICD-10-CM | POA: Diagnosis not present

## 2020-09-25 DIAGNOSIS — Z6827 Body mass index (BMI) 27.0-27.9, adult: Secondary | ICD-10-CM | POA: Diagnosis not present

## 2020-09-25 DIAGNOSIS — M8588 Other specified disorders of bone density and structure, other site: Secondary | ICD-10-CM | POA: Diagnosis not present

## 2020-09-25 DIAGNOSIS — Z124 Encounter for screening for malignant neoplasm of cervix: Secondary | ICD-10-CM | POA: Diagnosis not present

## 2020-09-25 DIAGNOSIS — N958 Other specified menopausal and perimenopausal disorders: Secondary | ICD-10-CM | POA: Diagnosis not present

## 2020-09-25 DIAGNOSIS — Z1231 Encounter for screening mammogram for malignant neoplasm of breast: Secondary | ICD-10-CM | POA: Diagnosis not present

## 2020-10-02 DIAGNOSIS — J3081 Allergic rhinitis due to animal (cat) (dog) hair and dander: Secondary | ICD-10-CM | POA: Diagnosis not present

## 2020-10-02 DIAGNOSIS — J3089 Other allergic rhinitis: Secondary | ICD-10-CM | POA: Diagnosis not present

## 2020-10-02 DIAGNOSIS — J301 Allergic rhinitis due to pollen: Secondary | ICD-10-CM | POA: Diagnosis not present

## 2020-10-07 DIAGNOSIS — J3081 Allergic rhinitis due to animal (cat) (dog) hair and dander: Secondary | ICD-10-CM | POA: Diagnosis not present

## 2020-10-07 DIAGNOSIS — J301 Allergic rhinitis due to pollen: Secondary | ICD-10-CM | POA: Diagnosis not present

## 2020-10-07 DIAGNOSIS — J3089 Other allergic rhinitis: Secondary | ICD-10-CM | POA: Diagnosis not present

## 2020-10-15 DIAGNOSIS — J3081 Allergic rhinitis due to animal (cat) (dog) hair and dander: Secondary | ICD-10-CM | POA: Diagnosis not present

## 2020-10-15 DIAGNOSIS — J301 Allergic rhinitis due to pollen: Secondary | ICD-10-CM | POA: Diagnosis not present

## 2020-10-15 DIAGNOSIS — J3089 Other allergic rhinitis: Secondary | ICD-10-CM | POA: Diagnosis not present

## 2020-10-30 ENCOUNTER — Other Ambulatory Visit: Payer: Self-pay

## 2020-10-30 MED ORDER — OMEPRAZOLE 20 MG PO CPDR: DELAYED_RELEASE_CAPSULE | ORAL | 1 refills | Status: DC

## 2020-11-04 ENCOUNTER — Ambulatory Visit (INDEPENDENT_AMBULATORY_CARE_PROVIDER_SITE_OTHER): Payer: Medicare HMO

## 2020-11-04 DIAGNOSIS — J3081 Allergic rhinitis due to animal (cat) (dog) hair and dander: Secondary | ICD-10-CM | POA: Diagnosis not present

## 2020-11-04 DIAGNOSIS — J301 Allergic rhinitis due to pollen: Secondary | ICD-10-CM | POA: Diagnosis not present

## 2020-11-04 DIAGNOSIS — Z0000 Encounter for general adult medical examination without abnormal findings: Secondary | ICD-10-CM | POA: Diagnosis not present

## 2020-11-04 DIAGNOSIS — J3089 Other allergic rhinitis: Secondary | ICD-10-CM | POA: Diagnosis not present

## 2020-11-04 NOTE — Progress Notes (Signed)
Subjective:   Kimberly Schroeder is a 70 y.o. female who presents for an Initial Medicare Annual Wellness Visit.  I connected with Kimberly Schroeder today by telephone and verified that I am speaking with the correct person using two identifiers. Location patient: home Location provider: work Persons participating in the virtual visit: patient, provider.   I discussed the limitations, risks, security and privacy concerns of performing an evaluation and management service by telephone and the availability of in person appointments. I also discussed with the patient that there may be a patient responsible charge related to this service. The patient expressed understanding and verbally consented to this telephonic visit.    Interactive audio and video telecommunications were attempted between this provider and patient, however failed, due to patient having technical difficulties OR patient did not have access to video capability.  We continued and completed visit with audio only.    Review of Systems    N/a       Objective:    There were no vitals filed for this visit. There is no height or weight on file to calculate BMI.  Advanced Directives 03/06/2020 06/23/2016 10/14/2015  Does Patient Have a Medical Advance Directive? No Yes Yes  Type of Advance Directive - [place];Living will [place];Living will  Does patient want to make changes to medical advance directive? - - Yes - information given  Copy of [place] in Chart? - - No - copy requested    Current Medications (verified) Outpatient Encounter Medications as of 11/04/2020  Medication Sig   amLODipine (NORVASC) 2.5 MG tablet TAKE 1 TABLET BY MOUTH EVERY DAY FOR BLOOD PRESSURE   cetirizine (ZYRTEC) 10 MG tablet Take 10 mg by mouth daily.   Cholecalciferol (VITAMIN D3 MAXIMUM STRENGTH) 125 MCG (5000 UT) capsule Take 5,000 Units by mouth in the morning and at bedtime. With  vitamin K and minerals   EPIPEN 2-PAK 0.3 MG/0.3ML SOAJ injection    Estradiol 10 MCG TABS vaginal tablet Place 1 each vaginally once a week.   fish oil-omega-3 fatty acids 1000 MG capsule Take 2 g by mouth daily.   fluticasone (FLONASE) 50 MCG/ACT nasal spray INSTILL 1 SPRAY INTO BOTH NOSTRILS DAILY   omeprazole (PRILOSEC) 20 MG capsule TAKE 1 CAPSULE BY MOUTH TWICE A DAY BEFORE A MEAL   psyllium (METAMUCIL) 58.6 % packet Take 1 packet by mouth every evening.   No facility-administered encounter medications on file as of 11/04/2020.    Allergies (verified) Iohexol   History: Past Medical History:  Diagnosis Date   Allergic rhinitis    allergist evaluation spring fall and dust.   Ectopic pregnancy    x 2   GERD (gastroesophageal reflux disease)    Hematuria, microscopic    eval 2001  Dr Terance Hart   Past Surgical History:  Procedure Laterality Date   LAPAROSCOPY     ectopic pregnancy   LAPAROSCOPY FOR ECTOPIC PREGNANCY     x2   Family History  Problem Relation Age of Onset   Colon cancer Father 42   Colon cancer Paternal Uncle        67's   Parkinsonism Other    Arthritis Other    Hypertension Other    Pancreatic cancer Neg Hx    Prostate cancer Neg Hx    Rectal cancer Neg Hx    Stomach cancer Neg Hx    Social History   Socioeconomic History   Marital status: Married  Spouse name: Not on file   Number of children: 0   Years of education: Not on file   Highest education level: Not on file  Occupational History   Occupation: retired  Tobacco Use   Smoking status: Never   Smokeless tobacco: Never  Vaping Use   Vaping Use: Never used  Substance and Sexual Activity   Alcohol use: Yes    Comment: occasional wine   Drug use: No   Sexual activity: Not on file  Other Topics Concern   Not on file  Social History Narrative   Married   [place] [employer] and rescue dog   Exercises   G2   Husband recovering from severe myocardiopathy viral and chf getting better will  not need a heart transplant   Has had ileitis with  pouch   Husband  Retired    No ets.    [employer]  Family elederly.    Social Determinants of Health   Financial Resource Strain: Not on file  Food Insecurity: Not on file  Transportation Needs: Not on file  Physical Activity: Not on file  Stress: Not on file  Social Connections: Not on file    Tobacco Counseling Counseling given: Not Answered   Clinical Intake:                 Diabetic?no         Activities of Daily Living No flowsheet data found.  Patient Care Team: Panosh, Standley Brooking, MD as PCP - General Mosetta Anis, MD (Allergy) Dian Queen, MD (Obstetrics and Gynecology) Mauri Pole, MD as Consulting Physician (Gastroenterology) Martinique, Amy, MD as Consulting Physician (Dermatology)  Indicate any recent Medical Services you may have received from other than [facility] providers in the past year (date may be approximate).     Assessment:   This is a routine wellness examination for Kimberly Schroeder.  Hearing/Vision screen No results found.  Dietary issues and exercise activities discussed:     Goals Addressed   None    Depression Screen PHQ 2/9 Scores 10/12/2019 04/04/2018 03/29/2017  PHQ - 2 Score 0 0 0  PHQ- 9 Score 0 - -    Fall Risk Fall Risk  10/12/2019 04/04/2018  Falls in the past year? 0 0    FALL RISK PREVENTION PERTAINING TO THE HOME:  Any stairs in or around the home? Yes  If so, are there any without handrails? No  Home free of loose throw rugs in walkways, pet beds, electrical cords, etc? Yes  Adequate lighting in your home to reduce risk of falls? Yes   ASSISTIVE DEVICES UTILIZED TO PREVENT FALLS:  Life alert? No  Use of a cane, walker or w/c? No  Grab bars in the bathroom? No  Shower chair or bench in shower? No  Elevated toilet seat or a handicapped toilet? No   T Cognitive Function:    Normal cognitive status assessed by direct observation by this Nurse  Health Advisor. No abnormalities found.      Immunizations Immunization History  Administered Date(s) Administered   Fluad Quad(high Dose 65+) 01/17/2019, 02/08/2020   Influenza Split 02/17/2011   Influenza, High Dose Seasonal PF 03/09/2018   Influenza,inj,Quad PF,6+ Mos 01/25/2013, 02/12/2014, 02/23/2016, 03/04/2017   PFIZER(Purple Top)SARS-COV-2 Vaccination 06/07/2019, 06/28/2019, 04/16/2020   Pneumococcal Conjugate-13 04/04/2018   Pneumococcal Polysaccharide-23 10/12/2019   Td 04/28/2006   Tdap 03/29/2017   Zoster Recombinat (Shingrix) 11/29/2018   Zoster, Live 11/20/2012    TDAP status: Up to date  Flu Vaccine status: Up to date  Pneumococcal vaccine status: Up to date  Covid-19 vaccine status: Completed vaccines  Qualifies for Shingles Vaccine? Yes   Zostavax completed Yes   Shingrix Completed?: Yes  Screening Tests Health Maintenance  Topic Date Due   Zoster Vaccines- Shingrix (2 of 2) 01/24/2019   INFLUENZA VACCINE  12/15/2020   COLONOSCOPY (Pts 45-57yrs Insurance coverage will need to be confirmed)  07/08/2021   MAMMOGRAM  08/15/2021   TETANUS/TDAP  03/30/2027   DEXA SCAN  Completed   COVID-19 Vaccine  Completed   PNA vac Low Risk Adult  Completed   HPV VACCINES  Aged Out    Health Maintenance  Health Maintenance Due  Topic Date Due   Zoster Vaccines- Shingrix (2 of 2) 01/24/2019    Colorectal cancer screening: Type of screening: Colonoscopy. Completed 07/08/2016. Repeat every 5 years  Mammogram status: Completed 09/2020. Repeat every year  Bone Density status: Completed 09/2020. Results reflect: Bone density results: OSTEOPENIA. Repeat every 2 years.  Lung Cancer Screening: (Low Dose CT Chest recommended if Age 2-80 years, 30 pack-year currently smoking OR have quit w/in 15years.) does not qualify.   Lung Cancer Screening Referral: n/a  Additional Screening:  Hepatitis C Screening: does not qualify  Vision Screening: Recommended annual  ophthalmology exams for early detection of glaucoma and other disorders of the eye. Is the patient up to date with their annual eye exam?  Yes  Who is the provider or what is the name of the office in which the patient attends annual eye exams? [facility]  If pt is not established with a provider, would they like to be referred to a provider to establish care? No .   Dental Screening: Recommended annual dental exams for proper oral hygiene  Community Resource Referral / Chronic Care Management: CRR required this visit?  No   CCM required this visit?  No      Plan:     I have personally reviewed and noted the following in the patient's chart:   Medical and social history Use of alcohol, tobacco or illicit drugs  Current medications and supplements including opioid prescriptions. Patient is not currently taking opioid prescriptions. Functional ability and status Nutritional status Physical activity Advanced directives List of other physicians Hospitalizations, surgeries, and ER visits in previous 12 months Vitals Screenings to include cognitive, depression, and falls Referrals and appointments  In addition, I have reviewed and discussed with patient certain preventive protocols, quality metrics, and best practice recommendations. A written personalized care plan for preventive services as well as general preventive health recommendations were provided to patient.     Randel Pigg, LPN   [id number]   Nurse Notes: none

## 2020-11-04 NOTE — Patient Instructions (Signed)
Kimberly Schroeder , Thank you for taking time to come for your Medicare Wellness Visit. I appreciate your ongoing commitment to your health goals. Please review the following plan we discussed and let me know if I can assist you in the future.   Screening recommendations/referrals: Colonoscopy: due 07/08/2021 Mammogram: due 09/2021 Bone Density: due 09/2022 Recommended yearly ophthalmology/optometry visit for glaucoma screening and checkup Recommended yearly dental visit for hygiene and checkup  Vaccinations: Influenza vaccine: due fall 2022 Pneumococcal vaccine: completed series Tdap vaccine: current due 2028 Shingles vaccine: completed series  Advanced directives: will provide copies   Conditions/risks identified: none  Next appointment: none    Preventive Care 39 Years and Older, Female Preventive care refers to lifestyle choices and visits with your health care provider that can promote health and wellness. What does preventive care include? A yearly physical exam. This is also called an annual well check. Dental exams once or twice a year. Routine eye exams. Ask your health care provider how often you should have your eyes checked. Personal lifestyle choices, including: Daily care of your teeth and gums. Regular physical activity. Eating a healthy diet. Avoiding tobacco and drug use. Limiting alcohol use. Practicing safe sex. Taking low-dose aspirin every day. Taking vitamin and mineral supplements as recommended by your health care provider. What happens during an annual well check? The services and screenings done by your health care provider during your annual well check will depend on your age, overall health, lifestyle risk factors, and family history of disease. Counseling  Your health care provider may ask you questions about your: Alcohol use. Tobacco use. Drug use. Emotional well-being. Home and relationship well-being. Sexual activity. Eating habits. History of  falls. Memory and ability to understand (cognition). Work and work Statistician. Reproductive health. Screening  You may have the following tests or measurements: Height, weight, and BMI. Blood pressure. Lipid and cholesterol levels. These may be checked every 5 years, or more frequently if you are over 65 years old. Skin check. Lung cancer screening. You may have this screening every year starting at age 32 if you have a 30-pack-year history of smoking and currently smoke or have quit within the past 15 years. Fecal occult blood test (FOBT) of the stool. You may have this test every year starting at age 69. Flexible sigmoidoscopy or colonoscopy. You may have a sigmoidoscopy every 5 years or a colonoscopy every 10 years starting at age 52. Hepatitis C blood test. Hepatitis B blood test. Sexually transmitted disease (STD) testing. Diabetes screening. This is done by checking your blood sugar (glucose) after you have not eaten for a while (fasting). You may have this done every 1-3 years. Bone density scan. This is done to screen for osteoporosis. You may have this done starting at age 13. Mammogram. This may be done every 1-2 years. Talk to your health care provider about how often you should have regular mammograms. Talk with your health care provider about your test results, treatment options, and if necessary, the need for more tests. Vaccines  Your health care provider may recommend certain vaccines, such as: Influenza vaccine. This is recommended every year. Tetanus, diphtheria, and acellular pertussis (Tdap, Td) vaccine. You may need a Td booster every 10 years. Zoster vaccine. You may need this after age 20. Pneumococcal 13-valent conjugate (PCV13) vaccine. One dose is recommended after age 39. Pneumococcal polysaccharide (PPSV23) vaccine. One dose is recommended after age 106. Talk to your health care provider about which screenings and vaccines you  need and how often you need  them. This information is not intended to replace advice given to you by your health care provider. Make sure you discuss any questions you have with your health care provider. Document Released: 05/30/2015 Document Revised: 01/21/2016 Document Reviewed: 03/04/2015 Elsevier Interactive Patient Education  2017 [place] Prevention in the Home Falls can cause injuries. They can happen to people of all ages. There are many things you can do to make your home safe and to help prevent falls. What can I do on the outside of my home? Regularly fix the edges of walkways and driveways and fix any cracks. Remove anything that might make you trip as you walk through a door, such as a raised step or threshold. Trim any bushes or trees on the path to your home. Use bright outdoor lighting. Clear any walking paths of anything that might make someone trip, such as rocks or tools. Regularly check to see if handrails are loose or broken. Make sure that both sides of any steps have handrails. Any raised decks and porches should have guardrails on the edges. Have any leaves, snow, or ice cleared regularly. Use sand or salt on walking paths during winter. Clean up any spills in your garage right away. This includes oil or grease spills. What can I do in the bathroom? Use night lights. Install grab bars by the toilet and in the tub and shower. Do not use towel bars as grab bars. Use non-skid mats or decals in the tub or shower. If you need to sit down in the shower, use a plastic, non-slip stool. Keep the floor dry. Clean up any water that spills on the floor as soon as it happens. Remove soap buildup in the tub or shower regularly. Attach bath mats securely with double-sided non-slip rug tape. Do not have throw rugs and other things on the floor that can make you trip. What can I do in the bedroom? Use night lights. Make sure that you have a light by your bed that is easy to reach. Do not use  any sheets or blankets that are too big for your bed. They should not hang down onto the floor. Have a firm chair that has side arms. You can use this for support while you get dressed. Do not have throw rugs and other things on the floor that can make you trip. What can I do in the kitchen? Clean up any spills right away. Avoid walking on wet floors. Keep items that you use a lot in easy-to-reach places. If you need to reach something above you, use a strong step stool that has a grab bar. Keep electrical cords out of the way. Do not use floor polish or wax that makes floors slippery. If you must use wax, use non-skid floor wax. Do not have throw rugs and other things on the floor that can make you trip. What can I do with my stairs? Do not leave any items on the stairs. Make sure that there are handrails on both sides of the stairs and use them. Fix handrails that are broken or loose. Make sure that handrails are as long as the stairways. Check any carpeting to make sure that it is firmly attached to the stairs. Fix any carpet that is loose or worn. Avoid having throw rugs at the top or bottom of the stairs. If you do have throw rugs, attach them to the floor with carpet tape. Make sure that  you have a light switch at the top of the stairs and the bottom of the stairs. If you do not have them, ask someone to add them for you. What else can I do to help prevent falls? Wear shoes that: Do not have high heels. Have rubber bottoms. Are comfortable and fit you well. Are closed at the toe. Do not wear sandals. If you use a stepladder: Make sure that it is fully opened. Do not climb a closed stepladder. Make sure that both sides of the stepladder are locked into place. Ask someone to hold it for you, if possible. Clearly mark and make sure that you can see: Any grab bars or handrails. First and last steps. Where the edge of each step is. Use tools that help you move around (mobility aids)  if they are needed. These include: Canes. Walkers. Scooters. Crutches. Turn on the lights when you go into a dark area. Replace any light bulbs as soon as they burn out. Set up your furniture so you have a clear path. Avoid moving your furniture around. If any of your floors are uneven, fix them. If there are any pets around you, be aware of where they are. Review your medicines with your doctor. Some medicines can make you feel dizzy. This can increase your chance of falling. Ask your doctor what other things that you can do to help prevent falls. This information is not intended to replace advice given to you by your health care provider. Make sure you discuss any questions you have with your health care provider. Document Released: 02/27/2009 Document Revised: 10/09/2015 Document Reviewed: 06/07/2014 Elsevier Interactive Patient Education  2017 [employer].

## 2020-11-12 DIAGNOSIS — J3089 Other allergic rhinitis: Secondary | ICD-10-CM | POA: Diagnosis not present

## 2020-11-12 DIAGNOSIS — J301 Allergic rhinitis due to pollen: Secondary | ICD-10-CM | POA: Diagnosis not present

## 2020-11-12 DIAGNOSIS — J3081 Allergic rhinitis due to animal (cat) (dog) hair and dander: Secondary | ICD-10-CM | POA: Diagnosis not present

## 2020-11-19 DIAGNOSIS — J3089 Other allergic rhinitis: Secondary | ICD-10-CM | POA: Diagnosis not present

## 2020-11-19 DIAGNOSIS — J3081 Allergic rhinitis due to animal (cat) (dog) hair and dander: Secondary | ICD-10-CM | POA: Diagnosis not present

## 2020-11-19 DIAGNOSIS — J301 Allergic rhinitis due to pollen: Secondary | ICD-10-CM | POA: Diagnosis not present

## 2020-11-23 NOTE — Progress Notes (Signed)
Chief Complaint  Patient presents with   Annual Exam     HPI: Kimberly Schroeder 70 y.o. comes in today for Preventive Medicare exam/ wellness visit  and med check .Since last visit.  No major changes in health doing pretty well Have some concerns about her right knee more than left when she is doing her regular exercise and walking is sometimes bother some medial without swelling.  No injury. Hx of IT band  in past  Bp has been good when checked.  Low-dose amlodipine  Gi down to 20 mg per day .  Omeprazole planning to wean more  Allergy shots continues..  Health Maintenance  Topic Date Due   Zoster Vaccines- Shingrix (2 of 2) 01/24/2019   MAMMOGRAM  08/15/2020   INFLUENZA VACCINE  12/15/2020   COLONOSCOPY (Pts 45-49yrs Insurance coverage will need to be confirmed)  07/08/2021   TETANUS/TDAP  03/30/2027   DEXA SCAN  Completed   COVID-19 Vaccine  Completed   PNA vac Low Risk Adult  Completed   HPV VACCINES  Aged Out   Health Maintenance Review LIFESTYLE:  Exercise:  walking   still ,  knee not as good  r more than left.  Up stairs .  Tobacco/ETS:n Alcohol:  sometimes  Sugar beverages: Sleep: 6-8  Drug use: no HH:  2 dog   Mom oa and father RA  older . Hearing:  ok  Vision:  No limitations at present . Last eye check UTD Safety:  Has smoke detector and wears seat belts.   No excess sun exposure. Sees dentist regularly. To get implant .  Depression: No anhedonia unusual crying or depressive symptoms Nutrition: Eats well balanced diet; adequate calcium and vitamin D. No swallowing chewing problems. Injury: no major injuries in the last six months. Other healthcare providers:  Reviewed today . Preventive parameters: up-to-date  Reviewed  ADLS:   There are no problems or need for assistance  driving, feeding, obtaining food, dressing, toileting and bathing, managing money using phone. She is independent.   ROS:  GEN/ HEENT: No fever, significant weight changes sweats  headaches vision problems hearing changes, CV/ PULM; No chest pain shortness of breath cough, syncope,edema  change in exercise tolerance. GI /GU: No adominal pain, vomiting, change in bowel habits. No blood in the stool. No significant GU symptoms. SKIN/HEME: ,no acute skin rashes suspicious lesions or bleeding. No lymphadenopathy, nodules, masses.  NEURO/ PSYCH:  No neurologic signs such as weakness numbness. No depression anxiety. IMM/ Allergy: No unusual infections.  Allergy .   REST of 12 system review negative except as per HPI   Past Medical History:  Diagnosis Date   Allergic rhinitis    allergist evaluation spring fall and dust.   Ectopic pregnancy    x 2   GERD (gastroesophageal reflux disease)    Hematuria, microscopic    eval 2001  Dr Terance Hart    Family History  Problem Relation Age of Onset   Colon cancer Father 1   Colon cancer Paternal Uncle        60's   Parkinsonism Other    Arthritis Other    Hypertension Other    Pancreatic cancer Neg Hx    Prostate cancer Neg Hx    Rectal cancer Neg Hx    Stomach cancer Neg Hx     Social History   Socioeconomic History   Marital status: Married    Spouse name: Not on file   Number of children: 0  Years of education: Not on file   Highest education level: Not on file  Occupational History   Occupation: retired  Tobacco Use   Smoking status: Never   Smokeless tobacco: Never  Vaping Use   Vaping Use: Never used  Substance and Sexual Activity   Alcohol use: Yes    Comment: occasional wine   Drug use: No   Sexual activity: Not on file  Other Topics Concern   Not on file  Social History Narrative   Married   [place] of 2 and rescue dog   Exercises   G2   Husband recovering from severe myocardiopathy viral and chf getting better will not need a heart transplant   Has had ileitis with  pouch   Husband  Retired    No ets.    caretraking  Family elederly.    Social Determinants of Health   Financial Resource  Strain: Low Risk    Difficulty of Paying Living Expenses: Not hard at all  Food Insecurity: No Food Insecurity   Worried About Charity fundraiser in the Last Year: Never true   [place] in the Last Year: Never true  Transportation Needs: No Transportation Needs   Lack of Transportation (Medical): No   Lack of Transportation (Non-Medical): No  Physical Activity: Sufficiently Active   Days of Exercise per Week: 6 days   Minutes of Exercise per Session: 40 min  Stress: No Stress Concern Present   Feeling of Stress : Not at all  Social Connections: Socially Integrated   Frequency of Communication with Friends and Family: Twice a week   Frequency of Social Gatherings with Friends and Family: Twice a week   Attends Religious Services: 1 to 4 times per year   Active Member of [employer] or Organizations: Yes   Attends Archivist Meetings: 1 to 4 times per year   Marital Status: Married    Outpatient Encounter Medications as of 11/24/2020  Medication Sig   amLODipine (NORVASC) 2.5 MG tablet TAKE 1 TABLET BY MOUTH EVERY DAY FOR BLOOD PRESSURE   cetirizine (ZYRTEC) 10 MG tablet Take 10 mg by mouth daily.   Cholecalciferol (VITAMIN D3 MAXIMUM STRENGTH) 125 MCG (5000 UT) capsule Take 5,000 Units by mouth in the morning and at bedtime. With vitamin K and minerals   EPIPEN 2-PAK 0.3 MG/0.3ML SOAJ injection    Estradiol 10 MCG TABS vaginal tablet Place 1 each vaginally once a week.   fish oil-omega-3 fatty acids 1000 MG capsule Take 2 g by mouth daily.   fluticasone (FLONASE) 50 MCG/ACT nasal spray INSTILL 1 SPRAY INTO BOTH NOSTRILS DAILY   omeprazole (PRILOSEC) 20 MG capsule TAKE 1 CAPSULE BY MOUTH TWICE A DAY BEFORE A MEAL   psyllium (METAMUCIL) 58.6 % packet Take 1 packet by mouth every evening.   No facility-administered encounter medications on file as of 11/24/2020.    EXAM:  BP 130/84 (BP Location: Left Arm, Patient Position: Sitting, Cuff Size: Normal)   Pulse 73   Temp  (!) 96.7 F (35.9 C) (Temporal)   Ht 5' 1.5" (1.562 m)   Wt 146 lb 3.2 oz (66.3 kg)   SpO2 97%   BMI 27.18 kg/m   Body mass index is 27.18 kg/m.  Physical Exam: Vital signs reviewed PTW:SFKC is a well-developed well-nourished alert cooperative   who appears stated age in no acute distress.  HEENT: normocephalic atraumatic , Eyes: PERRL EOM's full, conjunctiva clear, Nares: paten,t no deformity discharge or  tenderness., Ears: no deformity EAC's clear TMs with normal landmarks. Mouth: masked  NECK: supple without masses, thyromegaly or bruits. CHEST/PULM:  Clear to auscultation and percussion breath sounds equal no wheeze , rales or rhonchi. No chest wall deformities or tenderness. CV: PMI is nondisplaced, S1 S2 no gallops, murmurs, rubs. Peripheral pulses are full without delay.No JVD .  ABDOMEN: Bowel sounds normal nontender  No guard or rebound, no hepato splenomegal no CVA tenderness.   Extremtities:  No clubbing cyanosis or edema, no acute joint swelling or redness no focal atrophy r knee no effusion  pponts to medial bursa at area of tender  nl rom and muscle mass  NEURO:  Oriented x3, cranial nerves 3-12 appear to be intact, no obvious focal weakness,gait within normal limits no abnormal reflexes or asymmetrical SKIN: No acute rashes normal turgor, color, no bruising or petechiae. PSYCH: Oriented, good eye contact, no obvious depression anxiety, cognition and judgment appear normal. LN: no cervical axillary inguinal adenopathy No noted deficits in memory, attention, and speech.   Lab Results  Component Value Date   WBC 7.0 03/06/2020   HGB 14.3 03/06/2020   HCT 43.5 03/06/2020   PLT 354 03/06/2020   GLUCOSE 101 (H) 04/14/2020   CHOL 196 10/17/2019   TRIG 102.0 10/17/2019   HDL 56.30 10/17/2019   LDLDIRECT 132.6 04/16/2011   LDLCALC 119 (H) 10/17/2019   ALT 15 10/17/2019   AST 16 10/17/2019   NA 142 04/14/2020   K 5.0 04/14/2020   CL 104 04/14/2020   CREATININE 0.98  04/14/2020   BUN 17 04/14/2020   CO2 27 04/14/2020   TSH 1.94 03/22/2016   HGBA1C 5.4 10/17/2019    ASSESSMENT AND PLAN:  Discussed the following assessment and plan:  Visit for preventive health examination  Medication management - Plan: Basic metabolic panel, Lipid panel, Hepatic function panel, CBC with Differential/Platelet, Hemoglobin A1c, Hemoglobin A1c, CBC with Differential/Platelet, Hepatic function panel, Lipid panel, Basic metabolic panel  Elevated blood pressure reading - Plan: Basic metabolic panel, Lipid panel, Hepatic function panel, CBC with Differential/Platelet, Hemoglobin A1c, Hemoglobin A1c, CBC with Differential/Platelet, Hepatic function panel, Lipid panel, Basic metabolic panel  Elevated blood sugar - Plan: Basic metabolic panel, Lipid panel, Hepatic function panel, CBC with Differential/Platelet, Hemoglobin A1c, Hemoglobin A1c, CBC with Differential/Platelet, Hepatic function panel, Lipid panel, Basic metabolic panel  Chronic pain of right knee - Plan: Basic metabolic panel, Lipid panel, Hepatic function panel, CBC with Differential/Platelet, Hemoglobin A1c, Hemoglobin A1c, CBC with Differential/Platelet, Hepatic function panel, Lipid panel, Basic metabolic panel, Ambulatory referral to Sports Medicine  Patient Care Team: Burnis Medin, MD as PCP - General Mosetta Anis, MD (Allergy) Dian Queen, MD (Obstetrics and Gynecology) Mauri Pole, MD as Consulting Physician (Gastroenterology) [place], Amy, MD as Consulting Physician (Dermatology)  Patient Instructions  Good seeing you today . You will be contacted about  appt regarding the knee pain.  Will notify you  of labs when available.   If all ok then yearly  exam labs  as indicated .     Health Maintenance, Female Adopting a healthy lifestyle and getting preventive care are important in promoting health and wellness. Ask your health care provider about: The right schedule for you to have  regular tests and exams. Things you can do on your own to prevent diseases and keep yourself healthy. What should I know about diet, weight, and exercise? Eat a healthy diet  Eat a diet that includes plenty of vegetables, fruits, low-fat  dairy products, and lean protein. Do not eat a lot of foods that are high in solid fats, added sugars, or sodium.  Maintain a healthy weight Body mass index (BMI) is used to identify weight problems. It estimates body fat based on height and weight. Your health care provider can help determineyour BMI and help you achieve or maintain a healthy weight. Get regular exercise Get regular exercise. This is one of the most important things you can do for your health. Most adults should: Exercise for at least 150 minutes each week. The exercise should increase your heart rate and make you sweat (moderate-intensity exercise). Do strengthening exercises at least twice a week. This is in addition to the moderate-intensity exercise. Spend less time sitting. Even light physical activity can be beneficial. Watch cholesterol and blood lipids Have your blood tested for lipids and cholesterol at 68 years of age, then havethis test every 5 years. Have your cholesterol levels checked more often if: Your lipid or cholesterol levels are high. You are older than 68 years of age. You are at high risk for heart disease. What should I know about cancer screening? Depending on your health history and family history, you may need to have cancer screening at various ages. This may include screening for: Breast cancer. Cervical cancer. Colorectal cancer. Skin cancer. Lung cancer. What should I know about heart disease, diabetes, and high blood pressure? Blood pressure and heart disease High blood pressure causes heart disease and increases the risk of stroke. This is more likely to develop in people who have high blood pressure readings, are of African descent, or are  overweight. Have your blood pressure checked: Every 3-5 years if you are 47-6 years of age. Every year if you are 12 years old or older. Diabetes Have regular diabetes screenings. This checks your fasting blood sugar level. Have the screening done: Once every three years after age 48 if you are at a normal weight and have a low risk for diabetes. More often and at a younger age if you are overweight or have a high risk for diabetes. What should I know about preventing infection? Hepatitis B If you have a higher risk for hepatitis B, you should be screened for this virus. Talk with your health care provider to find out if you are at risk forhepatitis B infection. Hepatitis C Testing is recommended for: Everyone born from [birth year] through 1965. Anyone with known risk factors for hepatitis C. Sexually transmitted infections (STIs) Get screened for STIs, including gonorrhea and chlamydia, if: You are sexually active and are younger than 68 years of age. You are older than 68 years of age and your health care provider tells you that you are at risk for this type of infection. Your sexual activity has changed since you were last screened, and you are at increased risk for chlamydia or gonorrhea. Ask your health care provider if you are at risk. Ask your health care provider about whether you are at high risk for HIV. Your health care provider may recommend a prescription medicine to help prevent HIV infection. If you choose to take medicine to prevent HIV, you should first get tested for HIV. You should then be tested every 3 months for as long as you are taking the medicine. Pregnancy If you are about to stop having your period (premenopausal) and you may become pregnant, seek counseling before you get pregnant. Take 400 to 800 micrograms (mcg) of folic acid every day if you become pregnant.  Ask for birth control (contraception) if you want to prevent pregnancy. Osteoporosis and  menopause Osteoporosis is a disease in which the bones lose minerals and strength with aging. This can result in bone fractures. If you are 63 years old or older, or if you are at risk for osteoporosis and fractures, ask your health care provider if you should: Be screened for bone loss. Take a calcium or vitamin D supplement to lower your risk of fractures. Be given hormone replacement therapy (HRT) to treat symptoms of menopause. Follow these instructions at home: Lifestyle Do not use any products that contain nicotine or tobacco, such as cigarettes, e-cigarettes, and chewing tobacco. If you need help quitting, ask your health care provider. Do not use street drugs. Do not share needles. Ask your health care provider for help if you need support or information about quitting drugs. Alcohol use Do not drink alcohol if: Your health care provider tells you not to drink. You are pregnant, may be pregnant, or are planning to become pregnant. If you drink alcohol: Limit how much you use to 0-1 drink a day. Limit intake if you are breastfeeding. Be aware of how much alcohol is in your drink. In the [place] one drink equals one 12 oz bottle of beer (355 mL), one 5 oz glass of wine (148 mL), or one 1 oz glass of hard liquor (44 mL). General instructions Schedule regular health, dental, and eye exams. Stay current with your vaccines. Tell your health care provider if: You often feel depressed. You have ever been abused or do not feel safe at home. Summary Adopting a healthy lifestyle and getting preventive care are important in promoting health and wellness. Follow your health care provider's instructions about healthy diet, exercising, and getting tested or screened for diseases. Follow your health care provider's instructions on monitoring your cholesterol and blood pressure. This information is not intended to replace advice given to you by your health care provider. Make sure you discuss any  questions you have with your healthcare provider. Document Revised: 04/26/2018 Document Reviewed: 04/26/2018 Elsevier Patient Education  2022 [place]. Tereza Gilham M.D.

## 2020-11-24 ENCOUNTER — Ambulatory Visit (INDEPENDENT_AMBULATORY_CARE_PROVIDER_SITE_OTHER): Payer: Medicare HMO | Admitting: Internal Medicine

## 2020-11-24 ENCOUNTER — Other Ambulatory Visit: Payer: Self-pay

## 2020-11-24 ENCOUNTER — Encounter: Payer: Self-pay | Admitting: Internal Medicine

## 2020-11-24 VITALS — BP 130/84 | HR 73 | Temp 96.7°F | Ht 61.5 in | Wt 146.2 lb

## 2020-11-24 DIAGNOSIS — J301 Allergic rhinitis due to pollen: Secondary | ICD-10-CM | POA: Diagnosis not present

## 2020-11-24 DIAGNOSIS — J3081 Allergic rhinitis due to animal (cat) (dog) hair and dander: Secondary | ICD-10-CM | POA: Diagnosis not present

## 2020-11-24 DIAGNOSIS — M25561 Pain in right knee: Secondary | ICD-10-CM | POA: Diagnosis not present

## 2020-11-24 DIAGNOSIS — G8929 Other chronic pain: Secondary | ICD-10-CM

## 2020-11-24 DIAGNOSIS — J3089 Other allergic rhinitis: Secondary | ICD-10-CM | POA: Diagnosis not present

## 2020-11-24 DIAGNOSIS — R739 Hyperglycemia, unspecified: Secondary | ICD-10-CM | POA: Diagnosis not present

## 2020-11-24 DIAGNOSIS — R030 Elevated blood-pressure reading, without diagnosis of hypertension: Secondary | ICD-10-CM

## 2020-11-24 DIAGNOSIS — Z0000 Encounter for general adult medical examination without abnormal findings: Secondary | ICD-10-CM | POA: Diagnosis not present

## 2020-11-24 DIAGNOSIS — Z79899 Other long term (current) drug therapy: Secondary | ICD-10-CM

## 2020-11-24 LAB — BASIC METABOLIC PANEL
BUN: 17 mg/dL (ref 6–23)
CO2: 29 mEq/L (ref 19–32)
Calcium: 9.8 mg/dL (ref 8.4–10.5)
Chloride: 105 mEq/L (ref 96–112)
Creatinine, Ser: 0.87 mg/dL (ref 0.40–1.20)
GFR: 68.64 mL/min (ref >60.00)
Glucose, Bld: 95 mg/dL (ref 70–99)
Potassium: 4.3 mEq/L (ref 3.5–5.1)
Sodium: 141 mEq/L (ref 135–145)

## 2020-11-24 LAB — HEPATIC FUNCTION PANEL
ALT: 19 U/L (ref 0–35)
AST: 19 U/L (ref 0–37)
Albumin: 4.4 g/dL (ref 3.5–5.2)
Alkaline Phosphatase: 56 U/L (ref 39–117)
Bilirubin, Direct: 0.1 mg/dL (ref 0.0–0.3)
Total Bilirubin: 0.7 mg/dL (ref 0.2–1.2)
Total Protein: 7.1 g/dL (ref 6.0–8.3)

## 2020-11-24 LAB — CBC WITH DIFFERENTIAL/PLATELET
Basophils Absolute: 0 10*3/uL (ref 0.0–0.1)
Basophils Relative: 1 % (ref 0.0–3.0)
Eosinophils Absolute: 0.1 10*3/uL (ref 0.0–0.7)
Eosinophils Relative: 1.8 % (ref 0.0–5.0)
HCT: 44.5 % (ref 36.0–46.0)
Hemoglobin: 15.1 g/dL — ABNORMAL HIGH (ref 12.0–15.0)
Lymphocytes Relative: 36.9 % (ref 12.0–46.0)
Lymphs Abs: 1.5 10*3/uL (ref 0.7–4.0)
MCHC: 34 g/dL (ref 30.0–36.0)
MCV: 89.4 fl (ref 78.0–100.0)
Monocytes Absolute: 0.3 10*3/uL (ref 0.1–1.0)
Monocytes Relative: 6.4 % (ref 3.0–12.0)
Neutro Abs: 2.2 10*3/uL (ref 1.4–7.7)
Neutrophils Relative %: 53.9 % (ref 43.0–77.0)
Platelets: 312 10*3/uL (ref 150.0–400.0)
RBC: 4.98 Mil/uL (ref 3.87–5.11)
RDW: 12.9 % (ref 11.5–15.5)
WBC: 4.2 10*3/uL (ref 4.0–10.5)

## 2020-11-24 LAB — HEMOGLOBIN A1C: Hgb A1c MFr Bld: 5.4 % (ref 4.6–6.5)

## 2020-11-24 LAB — LIPID PANEL
Cholesterol: 208 mg/dL — ABNORMAL HIGH (ref 0–200)
HDL: 62.4 mg/dL (ref >39.00)
LDL Cholesterol: 130 mg/dL — ABNORMAL HIGH (ref 0–99)
NonHDL: 145.95
Total CHOL/HDL Ratio: 3
Triglycerides: 79 mg/dL (ref 0.0–149.0)
VLDL: 15.8 mg/dL (ref 0.0–40.0)

## 2020-11-24 NOTE — Patient Instructions (Signed)
Good seeing you today . You will be contacted about  appt regarding the knee pain.  Will notify you  of labs when available.   If all ok then yearly  exam labs  as indicated .     Health Maintenance, Female Adopting a healthy lifestyle and getting preventive care are important in promoting health and wellness. Ask your health care provider about: The right schedule for you to have regular tests and exams. Things you can do on your own to prevent diseases and keep yourself healthy. What should I know about diet, weight, and exercise? Eat a healthy diet  Eat a diet that includes plenty of vegetables, fruits, low-fat dairy products, and lean protein. Do not eat a lot of foods that are high in solid fats, added sugars, or sodium.  Maintain a healthy weight Body mass index (BMI) is used to identify weight problems. It estimates body fat based on height and weight. Your health care provider can help determineyour BMI and help you achieve or maintain a healthy weight. Get regular exercise Get regular exercise. This is one of the most important things you can do for your health. Most adults should: Exercise for at least 150 minutes each week. The exercise should increase your heart rate and make you sweat (moderate-intensity exercise). Do strengthening exercises at least twice a week. This is in addition to the moderate-intensity exercise. Spend less time sitting. Even light physical activity can be beneficial. Watch cholesterol and blood lipids Have your blood tested for lipids and cholesterol at 68 years of age, then havethis test every 5 years. Have your cholesterol levels checked more often if: Your lipid or cholesterol levels are high. You are older than 68 years of age. You are at high risk for heart disease. What should I know about cancer screening? Depending on your health history and family history, you may need to have cancer screening at various ages. This may include screening  for: Breast cancer. Cervical cancer. Colorectal cancer. Skin cancer. Lung cancer. What should I know about heart disease, diabetes, and high blood pressure? Blood pressure and heart disease High blood pressure causes heart disease and increases the risk of stroke. This is more likely to develop in people who have high blood pressure readings, are of African descent, or are overweight. Have your blood pressure checked: Every 3-5 years if you are 26-47 years of age. Every year if you are 58 years old or older. Diabetes Have regular diabetes screenings. This checks your fasting blood sugar level. Have the screening done: Once every three years after age 44 if you are at a normal weight and have a low risk for diabetes. More often and at a younger age if you are overweight or have a high risk for diabetes. What should I know about preventing infection? Hepatitis B If you have a higher risk for hepatitis B, you should be screened for this virus. Talk with your health care provider to find out if you are at risk forhepatitis B infection. Hepatitis C Testing is recommended for: Everyone born from [birth year] through 1965. Anyone with known risk factors for hepatitis C. Sexually transmitted infections (STIs) Get screened for STIs, including gonorrhea and chlamydia, if: You are sexually active and are younger than 68 years of age. You are older than 68 years of age and your health care provider tells you that you are at risk for this type of infection. Your sexual activity has changed since you were last screened, and you  are at increased risk for chlamydia or gonorrhea. Ask your health care provider if you are at risk. Ask your health care provider about whether you are at high risk for HIV. Your health care provider may recommend a prescription medicine to help prevent HIV infection. If you choose to take medicine to prevent HIV, you should first get tested for HIV. You should then be tested every 3  months for as long as you are taking the medicine. Pregnancy If you are about to stop having your period (premenopausal) and you may become pregnant, seek counseling before you get pregnant. Take 400 to 800 micrograms (mcg) of folic acid every day if you become pregnant. Ask for birth control (contraception) if you want to prevent pregnancy. Osteoporosis and menopause Osteoporosis is a disease in which the bones lose minerals and strength with aging. This can result in bone fractures. If you are 46 years old or older, or if you are at risk for osteoporosis and fractures, ask your health care provider if you should: Be screened for bone loss. Take a calcium or vitamin D supplement to lower your risk of fractures. Be given hormone replacement therapy (HRT) to treat symptoms of menopause. Follow these instructions at home: Lifestyle Do not use any products that contain nicotine or tobacco, such as cigarettes, e-cigarettes, and chewing tobacco. If you need help quitting, ask your health care provider. Do not use street drugs. Do not share needles. Ask your health care provider for help if you need support or information about quitting drugs. Alcohol use Do not drink alcohol if: Your health care provider tells you not to drink. You are pregnant, may be pregnant, or are planning to become pregnant. If you drink alcohol: Limit how much you use to 0-1 drink a day. Limit intake if you are breastfeeding. Be aware of how much alcohol is in your drink. In the [place] one drink equals one 12 oz bottle of beer (355 mL), one 5 oz glass of wine (148 mL), or one 1 oz glass of hard liquor (44 mL). General instructions Schedule regular health, dental, and eye exams. Stay current with your vaccines. Tell your health care provider if: You often feel depressed. You have ever been abused or do not feel safe at home. Summary Adopting a healthy lifestyle and getting preventive care are important in promoting  health and wellness. Follow your health care provider's instructions about healthy diet, exercising, and getting tested or screened for diseases. Follow your health care provider's instructions on monitoring your cholesterol and blood pressure. This information is not intended to replace advice given to you by your health care provider. Make sure you discuss any questions you have with your healthcare provider. Document Revised: 04/26/2018 Document Reviewed: 04/26/2018 Elsevier Patient Education  2022 [employer].

## 2020-12-02 DIAGNOSIS — J301 Allergic rhinitis due to pollen: Secondary | ICD-10-CM | POA: Diagnosis not present

## 2020-12-02 DIAGNOSIS — J3089 Other allergic rhinitis: Secondary | ICD-10-CM | POA: Diagnosis not present

## 2020-12-02 DIAGNOSIS — J3081 Allergic rhinitis due to animal (cat) (dog) hair and dander: Secondary | ICD-10-CM | POA: Diagnosis not present

## 2020-12-02 NOTE — Progress Notes (Signed)
Results generally normal or favorable hemoglobin borderline up may be clinically insignificant Blood sugar borderline but no diabetes Cholesterol slightly higher than last year .  And to intensification of healthy lifestyle eating and activity for cholesterol control T

## 2020-12-03 ENCOUNTER — Encounter: Payer: Self-pay | Admitting: Family Medicine

## 2020-12-03 ENCOUNTER — Ambulatory Visit: Payer: Self-pay

## 2020-12-03 ENCOUNTER — Ambulatory Visit (INDEPENDENT_AMBULATORY_CARE_PROVIDER_SITE_OTHER): Payer: Medicare HMO

## 2020-12-03 ENCOUNTER — Other Ambulatory Visit: Payer: Self-pay

## 2020-12-03 ENCOUNTER — Ambulatory Visit: Payer: Medicare HMO | Admitting: Family Medicine

## 2020-12-03 VITALS — BP 124/84 | HR 73 | Ht 61.5 in | Wt 148.0 lb

## 2020-12-03 DIAGNOSIS — M25562 Pain in left knee: Secondary | ICD-10-CM | POA: Diagnosis not present

## 2020-12-03 DIAGNOSIS — M1712 Unilateral primary osteoarthritis, left knee: Secondary | ICD-10-CM | POA: Diagnosis not present

## 2020-12-03 DIAGNOSIS — M25561 Pain in right knee: Secondary | ICD-10-CM | POA: Diagnosis not present

## 2020-12-03 DIAGNOSIS — M1711 Unilateral primary osteoarthritis, right knee: Secondary | ICD-10-CM | POA: Diagnosis not present

## 2020-12-03 NOTE — Progress Notes (Signed)
    Subjective:    CC: B knee pain, R>L  I, Kimberly Schroeder, am serving as a scribe for Dr. Lynne Leader.   HPI: Pt is a 70 y/o female presenting w/ c/o B knee pain, R>L, x 4-5 months gotten worse more recently.  She locates her pain to below patella and medial/lateral patella.  Knee swelling: no Knee mechanical symptoms:no Aggravating factors: Kneeling, going up stairs, squats Treatments tried: cushions for kneeling, elevation  Pertinent review of Systems: No fevers or chills  Relevant historical information: IT syndrome diagnosed on right knee in her 30's   Objective:    Vitals:   12/03/20 1259  BP: 124/84  Pulse: 73  SpO2: 99%   General: Well Developed, well nourished, and in no acute distress.   MSK: Bilateral knees normal-appearing without significant effusion.  Normal motion with crepitation.  Mildly tender palpation medial joint lines. Stable ligamentous exam. Intact strength. Normal gait.  Lab and Radiology Results  X-ray images bilateral knees obtained today personally and independently interpreted  Right knee: Mild DJD.  No acute fractures.  Left knee: Mild DJD.  No acute fractures.  Await formal radiology review   Impression and Recommendations:    Assessment and Plan: 70 y.o. female with bilateral anterior knee pain.  Patient has mild degenerative changes on x-ray of bilateral knees and physical exam findings consistent with patellofemoral chondromalacia.  Discussed conservative management strategies.  Plan Voltaren gel and physical therapy referral focused on quad strengthening.  Check back in about 8 weeks.  If not improved would consider steroid injections or hyaluronic acid injections.  Patient likes this plan.Marland Kitchen  PDMP not reviewed this encounter. Orders Placed This Encounter  Procedures   DG Knee AP/LAT W/Sunrise Right    Standing Status:   Future    Number of Occurrences:   1    Standing Expiration Date:   12/03/2021    Order Specific  Question:   Reason for Exam (SYMPTOM  OR DIAGNOSIS REQUIRED)    Answer:   eval BL knee pain    Order Specific Question:   Preferred imaging location?    Answer:   [facility]   DG Knee AP/LAT W/Sunrise Left    Standing Status:   Future    Number of Occurrences:   1    Standing Expiration Date:   12/03/2021    Order Specific Question:   Reason for Exam (SYMPTOM  OR DIAGNOSIS REQUIRED)    Answer:   Eval BL knee pain    Order Specific Question:   Preferred imaging location?    Answer:   [facility]   Ambulatory referral to Physical Therapy    Referral Priority:   Routine    Referral Type:   Physical Medicine    Referral Reason:   Specialty Services Required    Requested Specialty:   Physical Therapy    Number of Visits Requested:   1   No orders of the defined types were placed in this encounter.   Discussed warning signs or symptoms. Please see discharge instructions. Patient expresses understanding.   The above documentation has been reviewed and is accurate and complete Lynne Leader, M.D.

## 2020-12-03 NOTE — Patient Instructions (Signed)
Thank you for coming in today.   Please get an Xray today before you leave   I've referred you to Physical Therapy.  Let us know if you don't hear from them in one week.    Please use Voltaren gel (Generic Diclofenac Gel) up to 4x daily for pain as needed.  This is available over-the-counter as both the name brand Voltaren gel and the generic diclofenac gel.   [place] [facility] psychotherapist in about 8 weeks. If not better we can injection or other treatments.

## 2020-12-05 NOTE — Progress Notes (Signed)
Left knee x-ray shows mild arthritis changes

## 2020-12-05 NOTE — Progress Notes (Signed)
Right knee x-ray shows mild arthritis changes

## 2020-12-10 DIAGNOSIS — J3081 Allergic rhinitis due to animal (cat) (dog) hair and dander: Secondary | ICD-10-CM | POA: Diagnosis not present

## 2020-12-10 DIAGNOSIS — J3089 Other allergic rhinitis: Secondary | ICD-10-CM | POA: Diagnosis not present

## 2020-12-10 DIAGNOSIS — J301 Allergic rhinitis due to pollen: Secondary | ICD-10-CM | POA: Diagnosis not present

## 2020-12-11 DIAGNOSIS — Z20822 Contact with and (suspected) exposure to covid-19: Secondary | ICD-10-CM | POA: Diagnosis not present

## 2020-12-12 ENCOUNTER — Ambulatory Visit: Payer: Medicare HMO | Admitting: Physical Therapy

## 2020-12-24 DIAGNOSIS — J301 Allergic rhinitis due to pollen: Secondary | ICD-10-CM | POA: Diagnosis not present

## 2020-12-24 DIAGNOSIS — J3081 Allergic rhinitis due to animal (cat) (dog) hair and dander: Secondary | ICD-10-CM | POA: Diagnosis not present

## 2020-12-24 DIAGNOSIS — J3089 Other allergic rhinitis: Secondary | ICD-10-CM | POA: Diagnosis not present

## 2020-12-30 DIAGNOSIS — J301 Allergic rhinitis due to pollen: Secondary | ICD-10-CM | POA: Diagnosis not present

## 2020-12-30 DIAGNOSIS — J3089 Other allergic rhinitis: Secondary | ICD-10-CM | POA: Diagnosis not present

## 2020-12-30 DIAGNOSIS — J3081 Allergic rhinitis due to animal (cat) (dog) hair and dander: Secondary | ICD-10-CM | POA: Diagnosis not present

## 2020-12-31 ENCOUNTER — Ambulatory Visit: Payer: Medicare HMO | Admitting: Physical Therapy

## 2021-01-05 DIAGNOSIS — J301 Allergic rhinitis due to pollen: Secondary | ICD-10-CM | POA: Diagnosis not present

## 2021-01-05 DIAGNOSIS — J3089 Other allergic rhinitis: Secondary | ICD-10-CM | POA: Diagnosis not present

## 2021-01-05 DIAGNOSIS — J3081 Allergic rhinitis due to animal (cat) (dog) hair and dander: Secondary | ICD-10-CM | POA: Diagnosis not present

## 2021-01-12 ENCOUNTER — Ambulatory Visit: Payer: Medicare HMO | Admitting: Physical Therapy

## 2021-01-12 ENCOUNTER — Other Ambulatory Visit: Payer: Self-pay

## 2021-01-12 ENCOUNTER — Encounter: Payer: Self-pay | Admitting: Physical Therapy

## 2021-01-12 DIAGNOSIS — M25561 Pain in right knee: Secondary | ICD-10-CM | POA: Diagnosis not present

## 2021-01-12 DIAGNOSIS — M25562 Pain in left knee: Secondary | ICD-10-CM

## 2021-01-13 ENCOUNTER — Encounter: Payer: Self-pay | Admitting: Physical Therapy

## 2021-01-13 NOTE — Patient Instructions (Signed)
Access Code: [email or web address] URL: [email or web address] Date: 01/13/2021 Prepared by: Lyndee Hensen  Exercises Seated Knee Extension AROM - 2 x daily - 2 sets - 10 reps Supine Quadricep Sets - 1 x daily - 2 sets - 10 reps Straight Leg Raise - 1 x daily - 2 sets - 10 reps Sidelying Hip Abduction - 1 x daily - 2 sets - 10 reps Sit to Stand - 1 x daily - 2 sets - 10 reps

## 2021-01-13 NOTE — Therapy (Signed)
[facility] [address] [place], [place], [address] Phone: [phone]   Fax:  [phone]  Physical Therapy Evaluation  Patient Details  Name: Kimberly Schroeder MRN: [id number] Date of Birth: 07/23/1950 Referring Provider (PT): Lynne Leader   Encounter Date: 01/12/2021   PT End of Session - 01/13/21 2117     Visit Number 1    Number of Visits 12    Date for PT Re-Evaluation 02/23/21    Authorization Type Aetna    PT Start Time 1345    PT Stop Time 1420    PT Time Calculation (min) 35 min             Past Medical History:  Diagnosis Date   Allergic rhinitis    allergist evaluation spring fall and dust.   Ectopic pregnancy    x 2   GERD (gastroesophageal reflux disease)    Hematuria, microscopic    eval 2001  Dr Terance Hart    Past Surgical History:  Procedure Laterality Date   LAPAROSCOPY     ectopic pregnancy   LAPAROSCOPY FOR ECTOPIC PREGNANCY     x2    There were no vitals filed for this visit.    Subjective Assessment - 01/13/21 2112     Subjective Notes increased pain in bil knees, Stairs, kneeling, some  with walking. R>L.  Exercise: walking. would like to return to gym. Pt with mild pain at this time, but wants to learn exercises to reduce pain and prevent needing further intervention. Pt has had recent x-rays showing mild degeneration.    Limitations Standing;Walking;House hold activities    Patient Stated Goals decreased pain in knees.    Currently in Pain? Yes    Pain Score 3     Pain Location Knee    Pain Orientation Left;Right    Pain Descriptors / Indicators Aching    Pain Type Acute pain    Pain Onset More than a month ago    Pain Frequency Intermittent    Aggravating Factors  stairs, walking, kneeling    Pain Relieving Factors rest                [facility] PT Assessment - 01/13/21 0001       Assessment   Medical Diagnosis Bil knee pain    Referring Provider (PT) Lynne Leader    Prior  Therapy no      Precautions   Precautions None      Balance Screen   Has the patient fallen in the past 6 months No      Prior Function   Level of Independence Independent      Cognition   Overall Cognitive Status Within Functional Limits for tasks assessed      ROM / Strength   AROM / PROM / Strength AROM;Strength      AROM   Overall AROM Comments Knee ROM: WNL, Hip ROM: WNL      Strength   Overall Strength Comments Hips: 4+/5, Knees : 4+/5;      Palpation   Palpation comment Mild tenderness in med and lateral joint line on R;                        Objective measurements completed on examination: See above findings.       [facility] Adult PT Treatment/Exercise - 01/13/21 0001       Exercises   Exercises Knee/Hip      Knee/Hip  Exercises: Seated   Long Arc Quad 15 reps;Both    Sit to [employer] 10 reps      Knee/Hip Exercises: Supine   Quad Sets 15 reps;Both    Straight Leg Raises 10 reps;Both      Knee/Hip Exercises: Sidelying   Hip ABduction 10 reps;Both                    PT Education - 01/13/21 2116     Education Details PT POC, Exam findings, HEP    Person(s) Educated Patient    Methods Explanation;Demonstration;Tactile cues;Verbal cues;Handout    Comprehension Verbalized understanding;Returned demonstration;Verbal cues required;Tactile cues required;Need further instruction              PT Short Term Goals - 01/13/21 2120       PT SHORT TERM GOAL #1   Title Pt to be independent with initial HEP    Time 2    Period Weeks    Status New    Target Date 01/26/21               PT Long Term Goals - 01/13/21 2122       PT LONG TERM GOAL #1   Title Pt to be independent with final HEP    Time 6    Period Weeks    Status New    Target Date 02/23/21      PT LONG TERM GOAL #2   Title Pt to report decreased pain in bil knees to 0-2/10 with standing, walking, and stair activity.    Time 6    Period Weeks    Status New     Target Date 02/23/21      PT LONG TERM GOAL #3   Title Pt to demo ability for squat and stair climbing with optimal mechanics and no pain, to improve ability for IADLS and community activity.    Time 6    Period Weeks    Status New    Target Date 02/23/21                    Plan - 01/13/21 2141     Clinical Impression Statement Pt presents with primary complaint of increased pain in bil knees. Pt with increased pain with standing, walking, stairs, with decreased ability for IADLS, and exercise due to pain. Pt with mild strength deficits, and lack of effective HEP for dx. Pt to benefit from education on HEP for best strengthening exercises for OA, as well as education on return to gym actvities. Pt to benefit from skilled PT to improve deficits, pain, HEP, and ability for functional activity without pain.    Examination-Activity Limitations Bend;Squat;Stairs;Stand;Lift    Examination-Participation Restrictions Cleaning;Community Activity;Yard Work    Stability/Clinical Decision Making Stable/Uncomplicated    Clinical Decision Making Low    Rehab Potential Good    PT Frequency 1x / week    PT Duration 6 weeks    PT Treatment/Interventions ADLs/Self Care Home Management;Cryotherapy;Electrical Stimulation;Iontophoresis '4mg'$ /ml Dexamethasone;Traction;Moist Heat;Ultrasound;DME Instruction;Gait training;Stair training;Functional mobility training;Therapeutic activities;Therapeutic exercise;Balance training;Manual techniques;Orthotic Fit/Training;Patient/family education;Neuromuscular re-education;Compression bandaging;Passive range of motion;Dry needling;Taping;Spinal Manipulations;Joint Manipulations    PT Home Exercise Plan [id number]    Consulted and Agree with Plan of Care Patient             Patient will benefit from skilled therapeutic intervention in order to improve the following deficits and impairments:  Decreased activity tolerance, Decreased strength, Pain, Decreased  balance  Visit Diagnosis: Acute  pain of left knee  Acute pain of right knee     Problem List Patient Active Problem List   Diagnosis Date Noted   Elevated blood sugar level 06/22/2017   Hematuria 04/23/2011   Other and unspecified hyperlipidemia 04/23/2011   Allergic rhinitis    ABDOMINAL PAIN, RIGHT UPPER QUADRANT, HX OF 11/11/2008   Allergic rhinitis 01/26/2007   GERD 01/26/2007   Lyndee Hensen, PT, DPT 9:55 PM  01/13/21    [place], [place], [address] Phone: [phone]   Fax:  [phone]  Name: Kimberly Schroeder MRN: [id number] Date of Birth: 04/30/1951

## 2021-01-14 DIAGNOSIS — J3081 Allergic rhinitis due to animal (cat) (dog) hair and dander: Secondary | ICD-10-CM | POA: Diagnosis not present

## 2021-01-14 DIAGNOSIS — J3089 Other allergic rhinitis: Secondary | ICD-10-CM | POA: Diagnosis not present

## 2021-01-14 DIAGNOSIS — J301 Allergic rhinitis due to pollen: Secondary | ICD-10-CM | POA: Diagnosis not present

## 2021-01-22 ENCOUNTER — Ambulatory Visit: Payer: Medicare HMO | Admitting: Physical Therapy

## 2021-01-22 ENCOUNTER — Other Ambulatory Visit: Payer: Self-pay | Admitting: Internal Medicine

## 2021-01-22 ENCOUNTER — Other Ambulatory Visit: Payer: Self-pay

## 2021-01-22 DIAGNOSIS — M25562 Pain in left knee: Secondary | ICD-10-CM | POA: Diagnosis not present

## 2021-01-22 DIAGNOSIS — M25561 Pain in right knee: Secondary | ICD-10-CM

## 2021-01-26 ENCOUNTER — Encounter: Payer: Self-pay | Admitting: Physical Therapy

## 2021-01-26 ENCOUNTER — Other Ambulatory Visit: Payer: Self-pay

## 2021-01-26 ENCOUNTER — Ambulatory Visit: Payer: Medicare HMO | Admitting: Physical Therapy

## 2021-01-26 DIAGNOSIS — M25561 Pain in right knee: Secondary | ICD-10-CM | POA: Diagnosis not present

## 2021-01-26 DIAGNOSIS — M25562 Pain in left knee: Secondary | ICD-10-CM

## 2021-01-26 NOTE — Therapy (Signed)
[facility] [address] [place], [place], [address] Phone: [phone]   Fax:  [phone]  Physical Therapy Treatment  Patient Details  Name: Kimberly Schroeder MRN: [id number] Date of Birth: [birth year] Referring Provider (PT): Lynne Leader   Encounter Date: 01/22/2021   PT End of Session - 01/26/21 1323     Visit Number 2    Number of Visits 12    Date for PT Re-Evaluation 02/23/21    Authorization Type [employer]et[employer]    PT Start Time 1430    PT Stop Time 1510    PT Time Calculation (min) 40 min    Activity Tolerance Patient tolerated treatment well    Behavior During Therapy [facility] for tasks assessed/performed             Past Medical History:  Diagnosis Date   Allergic rhinitis    allergist evaluation spring fall and dust.   Ectopic pregnancy    x 2   GERD (gastroesophageal reflux disease)    Hematuria, microscopic    eval 2001  Dr Terance Hart    Past Surgical History:  Procedure Laterality Date   LAPAROSCOPY     ectopic pregnancy   LAPAROSCOPY FOR ECTOPIC PREGNANCY     x2    There were no vitals filed for this visit.   Subjective Assessment - 01/26/21 1322     Subjective Pt states minimal pain. Has had mild pain in R at times. Is doing HEP.    Currently in Pain? No/denies    Pain Score 0-No pain                               [facility] Adult PT Treatment/Exercise - 01/26/21 0001       Exercises   Exercises Knee/Hip      Knee/Hip Exercises: Aerobic   Recumbent Bike L1 x 5 min;      Knee/Hip Exercises: Standing   Hip Flexion 20 reps;Knee bent    Lateral Step Up 10 reps;1 set;Step Height: 6";Hand Hold: 1    Forward Step Up 10 reps;Both;Hand Hold: 1;Step Height: 6"    SLS with Vectors SLS with head turns x 10 bil;  Tandem stance with head turns x 10 bil;      Knee/Hip Exercises: Seated   Long Arc Quad 15 reps;Both    Sit to [employer] 10 reps      Knee/Hip Exercises: Supine   Quad Sets Both;10 reps     Bridges 20 reps    Straight Leg Raises 10 reps;Both      Knee/Hip Exercises: Sidelying   Hip ABduction Both;20 reps                     PT Education - 01/26/21 1323     Education Details updated HEP    Person(s) Educated Patient    Methods Explanation;Demonstration;Tactile cues;Verbal cues;Handout    Comprehension Verbalized understanding;Returned demonstration;Verbal cues required;Tactile cues required;Need further instruction              PT Short Term Goals - 01/13/21 2120       PT SHORT TERM GOAL #1   Title Pt to be independent with initial HEP    Time 2    Period Weeks    Status New    Target Date 01/26/21               PT Long Term Goals -  01/13/21 2122       PT LONG TERM GOAL #1   Title Pt to be independent with final HEP    Time 6    Period Weeks    Status New    Target Date 02/23/21      PT LONG TERM GOAL #2   Title Pt to report decreased pain in bil knees to 0-2/10 with standing, walking, and stair activity.    Time 6    Period Weeks    Status New    Target Date 02/23/21      PT LONG TERM GOAL #3   Title Pt to demo ability for squat and stair climbing with optimal mechanics and no pain, to improve ability for IADLS and community activity.    Time 6    Period Weeks    Status New    Target Date 02/23/21                   Plan - 01/26/21 1339     Clinical Impression Statement Pt able to progress strengthening today without pain. She was having pain with full LAQ motion, better after education and practice on optimal positioning. She has some limitation for stability with SL activity today, will benefit from continued education on strengthening for HEP.    Examination-Activity Limitations Bend;Squat;Stairs;Stand;Lift    Examination-Participation Restrictions Cleaning;Community Activity;Yard Work    Stability/Clinical Decision Making Stable/Uncomplicated    Rehab Potential Good    PT Frequency 1x / week    PT Duration  6 weeks    PT Treatment/Interventions ADLs/Self Care Home Management;Cryotherapy;Electrical Stimulation;Iontophoresis '4mg'$ /ml Dexamethasone;Traction;Moist Heat;Ultrasound;DME Instruction;Gait training;Stair training;Functional mobility training;Therapeutic activities;Therapeutic exercise;Balance training;Manual techniques;Orthotic Fit/Training;Patient/family education;Neuromuscular re-education;Compression bandaging;Passive range of motion;Dry needling;Taping;Spinal Manipulations;Joint Manipulations    PT Home Exercise Plan [id number]    Consulted and Agree with Plan of Care Patient             Patient will benefit from skilled therapeutic intervention in order to improve the following deficits and impairments:  Decreased activity tolerance, Decreased strength, Pain, Decreased balance  Visit Diagnosis: Acute pain of left knee  Acute pain of right knee     Problem List Patient Active Problem List   Diagnosis Date Noted   Elevated blood sugar level 06/22/2017   Hematuria 04/23/2011   Other and unspecified hyperlipidemia 04/23/2011   Allergic rhinitis    ABDOMINAL PAIN, RIGHT UPPER QUADRANT, HX OF 11/11/2008   Allergic rhinitis 01/26/2007   GERD 01/26/2007    Lyndee Hensen, PT, DPT 1:41 PM  01/26/21    [place], [place], [address] Phone: [phone]   Fax:  [phone]  Name: Kimberly Schroeder MRN: [id number] Date of Birth: November 12, 1950

## 2021-01-27 ENCOUNTER — Encounter: Payer: Self-pay | Admitting: Physical Therapy

## 2021-01-27 NOTE — Therapy (Signed)
[facility] [address] [place], [place], [address] Phone: [phone]   Fax:  [phone]  Physical Therapy Treatment  Patient Details  Name: KOSISOCHUKWU SANDFORD MRN: [id number] Date of Birth: 1951/03/22 Referring Provider (PT): Lynne Leader   Encounter Date: 01/26/2021   PT End of Session - 01/26/21 1523     Visit Number 3    Number of Visits 12    Date for PT Re-Evaluation 02/23/21    Authorization Type Aetna    PT Start Time 1515    PT Stop Time 1555    PT Time Calculation (min) 40 min    Activity Tolerance Patient tolerated treatment well    Behavior During Therapy [facility] for tasks assessed/performed             Past Medical History:  Diagnosis Date   Allergic rhinitis    allergist evaluation spring fall and dust.   Ectopic pregnancy    x 2   GERD (gastroesophageal reflux disease)    Hematuria, microscopic    eval 2001  Dr Terance Hart    Past Surgical History:  Procedure Laterality Date   LAPAROSCOPY     ectopic pregnancy   LAPAROSCOPY FOR ECTOPIC PREGNANCY     x2    There were no vitals filed for this visit.   Subjective Assessment - 01/26/21 1522     Subjective Pt states R knee mildly painful at times.    Pain Score 2     Pain Location Knee    Pain Orientation Right    Pain Descriptors / Indicators Aching    Pain Type Acute pain    Pain Onset More than a month ago    Pain Frequency Intermittent                               [facility] Adult PT Treatment/Exercise - 01/27/21 0001       Exercises   Exercises Knee/Hip      Knee/Hip Exercises: Stretches   Active Hamstring Stretch 3 reps;30 seconds    Active Hamstring Stretch Limitations seated.      Knee/Hip Exercises: Aerobic   Recumbent Bike L1 x 6 min;      Knee/Hip Exercises: Standing   Forward Step Up 10 reps;Both;Hand Hold: 1;Step Height: 6"    SLS with Vectors SLS with head turns x 10 bil;    Other Standing Knee Exercises  Walk/March 10 ft x 4;      Knee/Hip Exercises: Seated   Long Arc Quad 15 reps;Both    Long Arc Quad Limitations 2.5 lb    Sit to [employer] 10 reps      Knee/Hip Exercises: Supine   Bridges 20 reps    Straight Leg Raises Both;20 reps      Knee/Hip Exercises: Sidelying   Hip ABduction Both;20 reps      Manual Therapy   Manual Therapy Joint mobilization;Soft tissue mobilization    Soft tissue mobilization DTM/IASTm to lateral R knee ITB and lateral quad.                       PT Short Term Goals - 01/13/21 2120       PT SHORT TERM GOAL #1   Title Pt to be independent with initial HEP    Time 2    Period Weeks    Status New    Target Date 01/26/21  PT Long Term Goals - 01/13/21 2122       PT LONG TERM GOAL #1   Title Pt to be independent with final HEP    Time 6    Period Weeks    Status New    Target Date 02/23/21      PT LONG TERM GOAL #2   Title Pt to report decreased pain in bil knees to 0-2/10 with standing, walking, and stair activity.    Time 6    Period Weeks    Status New    Target Date 02/23/21      PT LONG TERM GOAL #3   Title Pt to demo ability for squat and stair climbing with optimal mechanics and no pain, to improve ability for IADLS and community activity.    Time 6    Period Weeks    Status New    Target Date 02/23/21                   Plan - 01/27/21 2148     Clinical Impression Statement Pt with mild increase in lateral knee pain today, tenderness in distal ITB.Manual done at lateral knee to decrease tightness and pain. Pt to benefit from continued care for lateral knee pain, strength, and stability.    Examination-Activity Limitations Bend;Squat;Stairs;Stand;Lift    Examination-Participation Restrictions Cleaning;Community Activity;Yard Work    Stability/Clinical Decision Making Stable/Uncomplicated    Rehab Potential Good    PT Frequency 1x / week    PT Duration 6 weeks    PT Treatment/Interventions  ADLs/Self Care Home Management;Cryotherapy;Electrical Stimulation;Iontophoresis '4mg'$ /ml Dexamethasone;Traction;Moist Heat;Ultrasound;DME Instruction;Gait training;Stair training;Functional mobility training;Therapeutic activities;Therapeutic exercise;Balance training;Manual techniques;Orthotic Fit/Training;Patient/family education;Neuromuscular re-education;Compression bandaging;Passive range of motion;Dry needling;Taping;Spinal Manipulations;Joint Manipulations    PT Home Exercise Plan [id number]    Consulted and Agree with Plan of Care Patient             Patient will benefit from skilled therapeutic intervention in order to improve the following deficits and impairments:  Decreased activity tolerance, Decreased strength, Pain, Decreased balance  Visit Diagnosis: Acute pain of left knee  Acute pain of right knee     Problem List Patient Active Problem List   Diagnosis Date Noted   Elevated blood sugar level 06/22/2017   Hematuria 04/23/2011   Other and unspecified hyperlipidemia 04/23/2011   Allergic rhinitis    ABDOMINAL PAIN, RIGHT UPPER QUADRANT, HX OF 11/11/2008   Allergic rhinitis 01/26/2007   GERD 01/26/2007   Lyndee Hensen, PT, DPT 9:50 PM  01/27/21   [place] [place], [place], [address] Phone: [phone]   Fax:  [phone]  Name: SAMYUKTA ORREN MRN: [id number] Date of Birth: April 25, 1951

## 2021-01-28 ENCOUNTER — Ambulatory Visit: Payer: Medicare HMO | Admitting: Physical Therapy

## 2021-01-28 ENCOUNTER — Encounter: Payer: Self-pay | Admitting: Physical Therapy

## 2021-01-28 ENCOUNTER — Other Ambulatory Visit: Payer: Self-pay

## 2021-01-28 ENCOUNTER — Ambulatory Visit: Payer: Medicare HMO | Admitting: Family Medicine

## 2021-01-28 DIAGNOSIS — M25562 Pain in left knee: Secondary | ICD-10-CM | POA: Diagnosis not present

## 2021-01-28 DIAGNOSIS — M25561 Pain in right knee: Secondary | ICD-10-CM | POA: Diagnosis not present

## 2021-01-28 NOTE — Therapy (Signed)
[facility] [address] [place], [place], [address] Phone: [phone]   Fax:  [phone]  Physical Therapy Treatment  Patient Details  Name: Kimberly Schroeder MRN: [id number] Date of Birth: 22-May-1950 Referring Provider (PT): Lynne Leader   Encounter Date: 01/28/2021   PT End of Session - 01/28/21 1112     Visit Number 4    Number of Visits 12    Date for PT Re-Evaluation 02/23/21    Authorization Type [employer]et[employer]    PT Start Time 1105    PT Stop Time 1145    PT Time Calculation (min) 40 min    Activity Tolerance Patient tolerated treatment well    Behavior During Therapy [facility] for tasks assessed/performed             Past Medical History:  Diagnosis Date   Allergic rhinitis    allergist evaluation spring fall and dust.   Ectopic pregnancy    x 2   GERD (gastroesophageal reflux disease)    Hematuria, microscopic    eval 2001  Dr Terance Hart    Past Surgical History:  Procedure Laterality Date   LAPAROSCOPY     ectopic pregnancy   LAPAROSCOPY FOR ECTOPIC PREGNANCY     x2    There were no vitals filed for this visit.   Subjective Assessment - 01/28/21 1111     Subjective Pt states improving pain in lateral knee since last visit.    Currently in Pain? Yes    Pain Location Knee    Pain Orientation Right    Pain Descriptors / Indicators Aching    Pain Type Acute pain    Pain Onset More than a month ago    Pain Frequency Intermittent                               [facility] Adult PT Treatment/Exercise - 01/28/21 0001       Exercises   Exercises Knee/Hip      Knee/Hip Exercises: Stretches   Active Hamstring Stretch 3 reps;30 seconds    Active Hamstring Stretch Limitations seated.      Knee/Hip Exercises: Aerobic   Recumbent Bike L1 x 5 min;      Knee/Hip Exercises: Standing   Stairs up/down 5 steps no UE support, 6 in, x 5;    Other Standing Knee Exercises AirEx march, no UE support x 20;     Other Standing Knee Exercises Step down Ecc on R x 15; 4 in step. 1 HR.      Knee/Hip Exercises: Seated   Sit to Sand 10 reps;without UE support   10 b     Knee/Hip Exercises: Supine   Bridges 20 reps    Straight Leg Raises Both;20 reps      Knee/Hip Exercises: Sidelying   Hip ABduction Both;20 reps      Modalities   Modalities Iontophoresis      Iontophoresis   Type of Iontophoresis Dexamethasone    Location lateral R knee distal ITB    Time 4 hr patch      Manual Therapy   Manual Therapy Joint mobilization;Soft tissue mobilization    Soft tissue mobilization DTM/IASTm to lateral R knee ITB and lateral quad.                       PT Short Term Goals - 01/13/21 2120  PT SHORT TERM GOAL #1   Title Pt to be independent with initial HEP    Time 2    Period Weeks    Status New    Target Date 01/26/21               PT Long Term Goals - 01/13/21 2122       PT LONG TERM GOAL #1   Title Pt to be independent with final HEP    Time 6    Period Weeks    Status New    Target Date 02/23/21      PT LONG TERM GOAL #2   Title Pt to report decreased pain in bil knees to 0-2/10 with standing, walking, and stair activity.    Time 6    Period Weeks    Status New    Target Date 02/23/21      PT LONG TERM GOAL #3   Title Pt to demo ability for squat and stair climbing with optimal mechanics and no pain, to improve ability for IADLS and community activity.    Time 6    Period Weeks    Status New    Target Date 02/23/21                   Plan - 01/28/21 1156     Clinical Impression Statement Pt with some improvement of tenderness at lateral knee and distal ITB, as well as improved pain wtih movement today. Pt able to progress exercises, with weight, and step downs, without increased pain. Noted weakness in hips and quad with step down. Pt to benefit from continued care.    Examination-Activity Limitations Bend;Squat;Stairs;Stand;Lift     Examination-Participation Restrictions Cleaning;Community Activity;Yard Work    Stability/Clinical Decision Making Stable/Uncomplicated    Rehab Potential Good    PT Frequency 1x / week    PT Duration 6 weeks    PT Treatment/Interventions ADLs/Self Care Home Management;Cryotherapy;Electrical Stimulation;Iontophoresis '4mg'$ /ml Dexamethasone;Traction;Moist Heat;Ultrasound;DME Instruction;Gait training;Stair training;Functional mobility training;Therapeutic activities;Therapeutic exercise;Balance training;Manual techniques;Orthotic Fit/Training;Patient/family education;Neuromuscular re-education;Compression bandaging;Passive range of motion;Dry needling;Taping;Spinal Manipulations;Joint Manipulations    PT Home Exercise Plan [id number]    Consulted and Agree with Plan of Care Patient             Patient will benefit from skilled therapeutic intervention in order to improve the following deficits and impairments:  Decreased activity tolerance, Decreased strength, Pain, Decreased balance  Visit Diagnosis: Acute pain of left knee  Acute pain of right knee     Problem List Patient Active Problem List   Diagnosis Date Noted   Elevated blood sugar level 06/22/2017   Hematuria 04/23/2011   Other and unspecified hyperlipidemia 04/23/2011   Allergic rhinitis    ABDOMINAL PAIN, RIGHT UPPER QUADRANT, HX OF 11/11/2008   Allergic rhinitis 01/26/2007   GERD 01/26/2007    Lyndee Hensen, PT, DPT 11:58 AM  01/28/21    [facility] [address] [place], [place], [address] Phone: [phone]   Fax:  [phone]  Name: Kimberly Schroeder MRN: [id number] Date of Birth: [birth year]

## 2021-02-02 ENCOUNTER — Encounter: Payer: Medicare HMO | Admitting: Physical Therapy

## 2021-02-03 DIAGNOSIS — J3081 Allergic rhinitis due to animal (cat) (dog) hair and dander: Secondary | ICD-10-CM | POA: Diagnosis not present

## 2021-02-03 DIAGNOSIS — J3089 Other allergic rhinitis: Secondary | ICD-10-CM | POA: Diagnosis not present

## 2021-02-03 DIAGNOSIS — J301 Allergic rhinitis due to pollen: Secondary | ICD-10-CM | POA: Diagnosis not present

## 2021-02-05 ENCOUNTER — Other Ambulatory Visit: Payer: Self-pay

## 2021-02-05 ENCOUNTER — Encounter: Payer: Self-pay | Admitting: Physical Therapy

## 2021-02-05 ENCOUNTER — Ambulatory Visit: Payer: Medicare HMO | Admitting: Physical Therapy

## 2021-02-05 ENCOUNTER — Encounter: Payer: Medicare HMO | Admitting: Physical Therapy

## 2021-02-05 DIAGNOSIS — M25561 Pain in right knee: Secondary | ICD-10-CM

## 2021-02-05 DIAGNOSIS — M25562 Pain in left knee: Secondary | ICD-10-CM

## 2021-02-05 NOTE — Patient Instructions (Signed)
Access Code: [email or web address] URL: [email or web address] Date: 02/05/2021 Prepared by: Lyndee Hensen  Exercises Seated Knee Extension AROM - 2 x daily - 2 sets - 10 reps Supine Quadricep Sets - 1 x daily - 2 sets - 10 reps Straight Leg Raise - 1 x daily - 2 sets - 10 reps Sidelying Hip Abduction - 1 x daily - 2 sets - 10 reps Sit to Stand - 1 x daily - 2 sets - 10 reps Step Up - 1 x daily - 2 sets - 10 reps Tandem Stance - 1 x daily - 3 reps - 30 hold Standing March - 1 x daily - 2 sets - 10 reps Supine Bridge - 1 x daily - 2 sets - 10 reps Supine ITB Stretch with Strap - 2 x daily - 3 reps - 30 hold

## 2021-02-05 NOTE — Therapy (Addendum)
[facility] [address] [place], [place], [address] Phone: [phone]   Fax:  [phone]  Physical Therapy Treatment  Patient Details  Name: Kimberly Schroeder MRN: [id number] Date of Birth: 10/24/1950 Referring Provider (PT): Lynne Leader   Encounter Date: 02/05/2021   PT End of Session - 02/05/21 1920     Visit Number 5    Number of Visits 12    Date for PT Re-Evaluation 02/23/21    Authorization Type Aetna    PT Start Time 1515    PT Stop Time 1555    PT Time Calculation (min) 40 min    Activity Tolerance Patient tolerated treatment well    Behavior During Therapy [facility] for tasks assessed/performed             Past Medical History:  Diagnosis Date   Allergic rhinitis    allergist evaluation spring fall and dust.   Ectopic pregnancy    x 2   GERD (gastroesophageal reflux disease)    Hematuria, microscopic    eval 2001  Dr Terance Hart    Past Surgical History:  Procedure Laterality Date   LAPAROSCOPY     ectopic pregnancy   LAPAROSCOPY FOR ECTOPIC PREGNANCY     x2    There were no vitals filed for this visit.   Subjective Assessment - 02/05/21 1917     Subjective Pt states much improved pain in R knee. Has been doing HEP, and regular activities without difficulty.    Currently in Pain? No/denies    Pain Score 0-No pain    Pain Onset More than a month ago                               [facility] Adult PT Treatment/Exercise - 02/05/21 0001       Exercises   Exercises Knee/Hip      Knee/Hip Exercises: Stretches   Active Hamstring Stretch 3 reps;30 seconds    Active Hamstring Stretch Limitations seated.      Knee/Hip Exercises: Aerobic   Recumbent Bike L2 x 5 min;      Knee/Hip Exercises: Standing   Lateral Step Up 10 reps;Right;Step Height: 6";Hand Hold: 0    Forward Step Up 10 reps;Both;Hand Hold: 0;Step Height: 6"    Stairs up/down 5 steps no UE support, 6 in, x 5;    Other Standing  Knee Exercises Tandem walking 10 ft x 4; March/walk 10 ft x 4;    Other Standing Knee Exercises Step down Ecc x 10 bil; ; 4 in step. 1 HR.      Knee/Hip Exercises: Seated   Sit to Sand 10 reps;without UE support      Knee/Hip Exercises: Supine   Bridges 20 reps    Straight Leg Raises --      Knee/Hip Exercises: Sidelying   Hip ABduction Both;20 reps      Modalities   Modalities Iontophoresis      Iontophoresis   Type of Iontophoresis --      Manual Therapy   Manual Therapy Joint mobilization;Soft tissue mobilization    Soft tissue mobilization DTM/IASTm to lateral R knee ITB and lateral quad.                       PT Short Term Goals - 02/05/21 1923       PT SHORT TERM GOAL #1   Title Pt to be  independent with initial HEP    Time 2    Period Weeks    Status Achieved    Target Date 01/26/21               PT Long Term Goals - 02/05/21 1923       PT LONG TERM GOAL #1   Title Pt to be independent with final HEP    Time 6    Period Weeks    Status Achieved      PT LONG TERM GOAL #2   Title Pt to report decreased pain in bil knees to 0-2/10 with standing, walking, and stair activity.    Time 6    Period Weeks    Status Achieved      PT LONG TERM GOAL #3   Title Pt to demo ability for squat and stair climbing with optimal mechanics and no pain, to improve ability for IADLS and community activity.    Time 6    Period Weeks    Status Achieved                   Plan - 02/05/21 1925     Clinical Impression Statement Pt with much improved pain. Much improved tenderness at lateral knee with palaption today, and no pain with activities. She is doing much better with stairs, as well as strengthening for LEs and HEP. She has met goals at this time. Pt will be put on hold for 2 weeks, will only return if she has increased pain or difficulty in next couple week, otherwise will be D/C. Pt in agreement with plan. Final HEP reviewed today.     Examination-Activity Limitations Bend;Squat;Stairs;Stand;Lift    Examination-Participation Restrictions Cleaning;Community Activity;Yard Work    Stability/Clinical Decision Making Stable/Uncomplicated    Rehab Potential Good    PT Frequency 1x / week    PT Duration 6 weeks    PT Treatment/Interventions ADLs/Self Care Home Management;Cryotherapy;Electrical Stimulation;Iontophoresis 71m/ml Dexamethasone;Traction;Moist Heat;Ultrasound;DME Instruction;Gait training;Stair training;Functional mobility training;Therapeutic activities;Therapeutic exercise;Balance training;Manual techniques;Orthotic Fit/Training;Patient/family education;Neuromuscular re-education;Compression bandaging;Passive range of motion;Dry needling;Taping;Spinal Manipulations;Joint Manipulations    PT Home Exercise Plan [email or web address]   Consulted and Agree with Plan of Care Patient             Patient will benefit from skilled therapeutic intervention in order to improve the following deficits and impairments:  Decreased activity tolerance, Decreased strength, Pain, Decreased balance  Visit Diagnosis: Acute pain of left knee  Acute pain of right knee     Problem List Patient Active Problem List   Diagnosis Date Noted   Elevated blood sugar level 06/22/2017   Hematuria 04/23/2011   Other and unspecified hyperlipidemia 04/23/2011   Allergic rhinitis    ABDOMINAL PAIN, RIGHT UPPER QUADRANT, HX OF 11/11/2008   Allergic rhinitis 01/26/2007   GERD 01/26/2007    LLyndee Hensen PT, DPT 7:30 PM  02/05/21    [place] [address]Phone: [phone]  Fax:  [phone] Name: Kimberly SLOMAMRN: [id number] of Birth: 629-May-1952  PHYSICAL THERAPY DISCHARGE SUMMARY  Visits from Start of Care:5 Plan: Patient agrees to discharge.  Patient goals were met. Patient is being discharged due to meeting the stated rehab goals.     LLyndee Hensen PT, DPT 11:31 AM  05/20/21

## 2021-02-06 IMAGING — CT CT HEART MORP W/ CTA COR W/ SCORE W/ CA W/CM &/OR W/O CM
4 of 7 series · 8 of 20 positions shown, 9 images · IV contrast (APPLIED)
Comparison: None.
COMPARISON: None.

Addendum:
EXAM:
OVER-READ INTERPRETATION  CT CHEST

The following report is an over-read performed by radiologist Dr.
Richma Lhaf [REDACTED] on 04/21/2020. This
over-read does not include interpretation of cardiac or coronary
anatomy or pathology. The coronary calcium score/coronary CTA
interpretation by the cardiologist is attached.
HISTORY: Chest pain, nonspecific
Cardiac/Coronary  CT
TECHNIQUE: The patient was scanned on a Siemens Force scanner.
PROTOCOL: A 120 kV prospective scan was triggered in the descending thoracic
aorta at 111 HU's. Axial non-contrast 3 mm slices were carried out
through the heart. The data set was analyzed on a dedicated work
station and scored using the Agatson method. Gantry rotation speed
was 250 msecs and collimation was .6 mm. Beta blockade and 0.8 mg of
sl NTG was given. The 3D data set was reconstructed in 5% intervals
of the 67-82 % of the R-R cycle. Diastolic phases were analyzed on a
dedicated work station using MPR, MIP and VRT modes. The patient
received 80mL OMNIPAQUE IOHEXOL 350 MG/ML SOLN of contrast.

[Series 7: best diast · axial · 0.39mm/px · z∈[-268,-229]mm · 2 of 291 slices shown, 3 images]
[im 97/291  vessel]
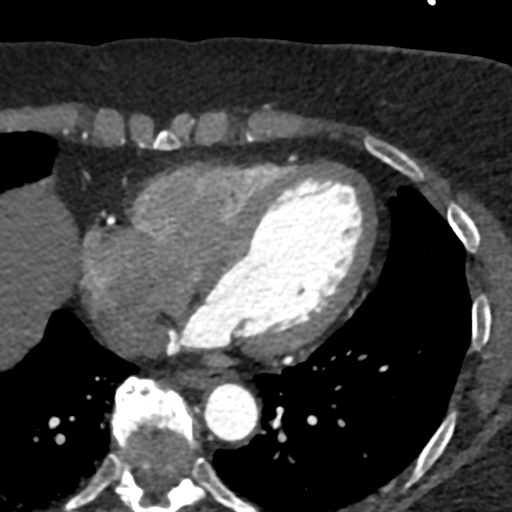
[im 97/291  lung]
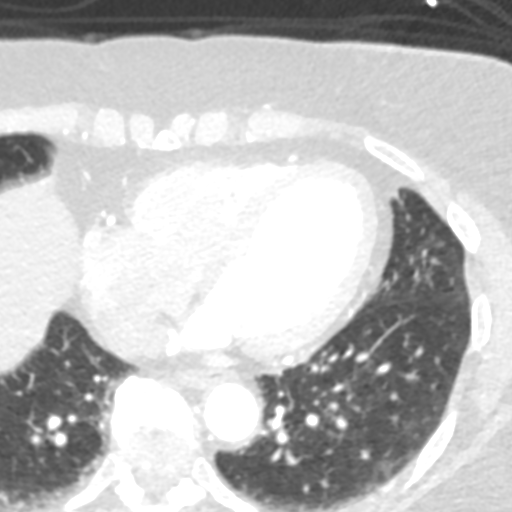
[im 194/291  vessel]
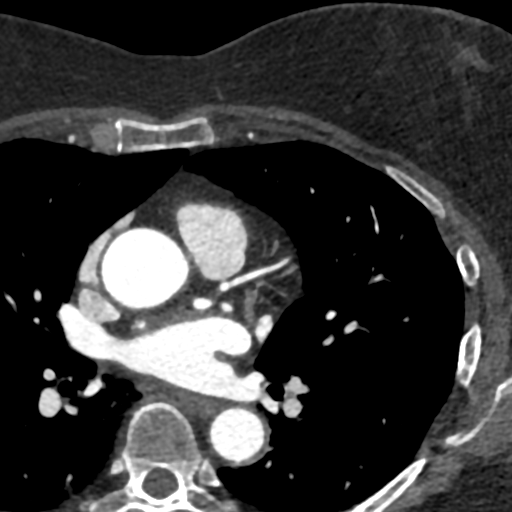

[Series 8: best syst · axial · 0.39mm/px · z∈[-268,-229]mm · 2 of 291 slices shown]
[im 97/291  vessel]
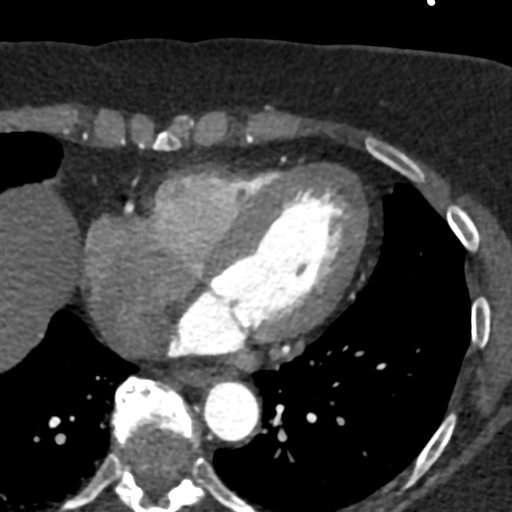
[im 194/291  vessel]
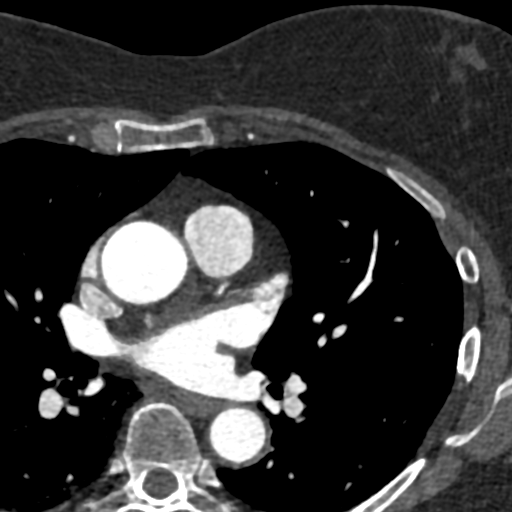

[Series 9: ts diast sharp · axial · 0.39mm/px · z∈[-268,-229]mm · 2 of 291 slices shown]
[im 97/291  lung]
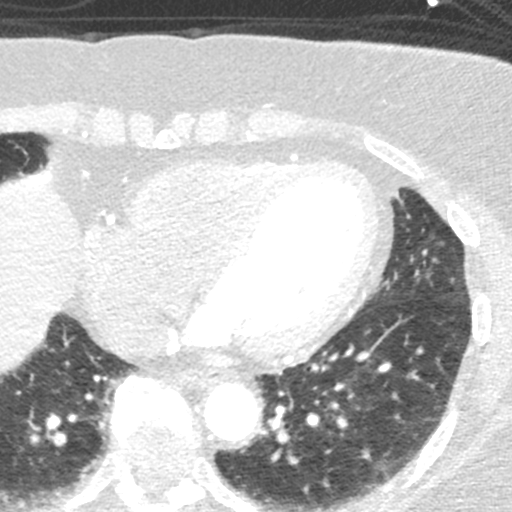
[im 194/291  lung]
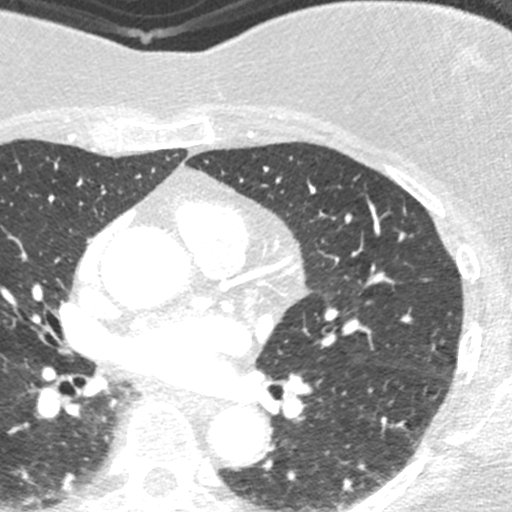

[Series 10: ts syst sharp · axial · 0.39mm/px · z∈[-268,-229]mm · 2 of 291 slices shown]
[im 97/291  lung]
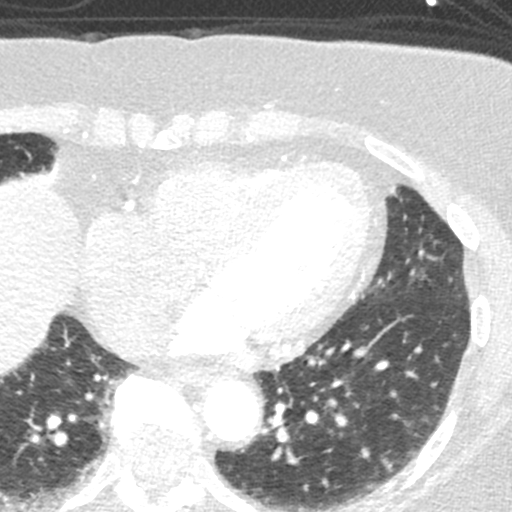
[im 194/291  lung]
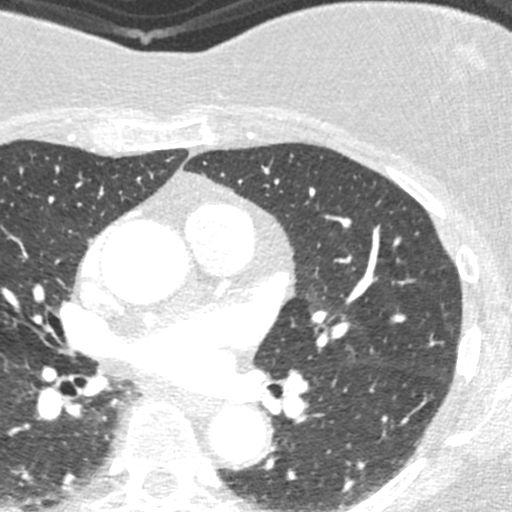

[8 of 20 positions shown; findings below may reference images not displayed]

FINDINGS: Within the visualized portions of the thorax there are no suspicious
appearing pulmonary nodules or masses, there is no acute
consolidative airspace disease, no pleural effusions, no
pneumothorax and no lymphadenopathy. Visualized portions of the
upper abdomen are unremarkable. There are no aggressive appearing
lytic or blastic lesions noted in the visualized portions of the
skeleton.
IMPRESSION: No significant incidental noncardiac findings are noted.
FINDINGS: Image quality: Good.

Noise artifact is: Limited.

Coronary calcium score is 0.

Coronary arteries: Normal coronary origins.  Right dominance.

Right Coronary Artery: No detectable plaque or stenosis.

Left Main Coronary Artery: No detectable plaque or stenosis.

Left Anterior Descending Coronary Artery: No detectable plaque or
stenosis.

Left Circumflex Artery: No detectable plaque or stenosis.

Aorta: Normal size, 32 mm at the mid ascending aorta (level of the
PA bifurcation) measured double oblique. No calcifications. No
dissection.

Aortic Valve: No calcifications, grossly normal appearing valve.

Other findings:

Normal pulmonary vein drainage into the left atrium.

Normal left atrial appendage without a thrombus.

Normal size of the pulmonary artery.
IMPRESSION: 1. No evidence of CAD, CADRADS = 0.

2. Coronary calcium score of 0.

3. Normal coronary origin with right dominance.

*** End of Addendum ***
EXAM:
OVER-READ INTERPRETATION  CT CHEST

The following report is an over-read performed by radiologist Dr.
Richma Lhaf [REDACTED] on 04/21/2020. This
over-read does not include interpretation of cardiac or coronary
anatomy or pathology. The coronary calcium score/coronary CTA
interpretation by the cardiologist is attached.
FINDINGS: Within the visualized portions of the thorax there are no suspicious
appearing pulmonary nodules or masses, there is no acute
consolidative airspace disease, no pleural effusions, no
pneumothorax and no lymphadenopathy. Visualized portions of the
upper abdomen are unremarkable. There are no aggressive appearing
lytic or blastic lesions noted in the visualized portions of the
skeleton.
IMPRESSION: No significant incidental noncardiac findings are noted.

## 2021-02-10 DIAGNOSIS — H524 Presbyopia: Secondary | ICD-10-CM | POA: Diagnosis not present

## 2021-02-10 DIAGNOSIS — H52221 Regular astigmatism, right eye: Secondary | ICD-10-CM | POA: Diagnosis not present

## 2021-02-10 DIAGNOSIS — H5203 Hypermetropia, bilateral: Secondary | ICD-10-CM | POA: Diagnosis not present

## 2021-02-11 ENCOUNTER — Encounter: Payer: Medicare HMO | Admitting: Physical Therapy

## 2021-02-17 DIAGNOSIS — J3081 Allergic rhinitis due to animal (cat) (dog) hair and dander: Secondary | ICD-10-CM | POA: Diagnosis not present

## 2021-02-17 DIAGNOSIS — J301 Allergic rhinitis due to pollen: Secondary | ICD-10-CM | POA: Diagnosis not present

## 2021-02-17 DIAGNOSIS — J3089 Other allergic rhinitis: Secondary | ICD-10-CM | POA: Diagnosis not present

## 2021-03-06 DIAGNOSIS — J3081 Allergic rhinitis due to animal (cat) (dog) hair and dander: Secondary | ICD-10-CM | POA: Diagnosis not present

## 2021-03-06 DIAGNOSIS — J301 Allergic rhinitis due to pollen: Secondary | ICD-10-CM | POA: Diagnosis not present

## 2021-03-06 DIAGNOSIS — J3089 Other allergic rhinitis: Secondary | ICD-10-CM | POA: Diagnosis not present

## 2021-03-09 ENCOUNTER — Ambulatory Visit (INDEPENDENT_AMBULATORY_CARE_PROVIDER_SITE_OTHER): Payer: Medicare HMO

## 2021-03-09 ENCOUNTER — Other Ambulatory Visit: Payer: Self-pay

## 2021-03-09 DIAGNOSIS — Z23 Encounter for immunization: Secondary | ICD-10-CM

## 2021-03-10 NOTE — Progress Notes (Signed)
   I, Peterson Lombard, LAT, ATC acting as a scribe for Lynne Leader, MD.  Kimberly Schroeder is a 70 y.o. female who presents to [facility] today for bilat knee pain thought to be due to mild degenerative changes and patellofemoral chondromalacia. Pt was last seen by Dr. Georgina Snell on 12/03/20 and was advised to use Voltaren gel and was referred to PT, completing 5 visits. Today, pt reports some improvement in bilat knee pain, 80% better. Pt notes the R knee is more bothersome, but better than when she was seen last.  Dx imaging: 12/03/20 R & L knee XR  Pertinent review of systems: No fevers or chills  Relevant historical information: GERD.   Exam:  BP 118/76   Pulse 75   Ht 5' 1.5" (1.562 m)   Wt 150 lb 9.6 oz (68.3 kg)   SpO2 97%   BMI 27.99 kg/m  General: Well Developed, well nourished, and in no acute distress.   MSK: Knees bilaterally normal-appearing normal motion normal gait.    Lab and Radiology Results  EXAM: RIGHT KNEE 3 VIEWS   COMPARISON:  None.   FINDINGS: Very mild medial joint space narrowing is noted. No joint effusion is seen. No acute fracture or dislocation is noted.   IMPRESSION: Mild degenerative change without acute abnormality.     Electronically Signed   By: Inez Catalina M.D.   On: 12/04/2020 10:38   EXAM: LEFT KNEE 3 VIEWS   COMPARISON:  None.   FINDINGS: Mild medial joint space narrowing is noted. Mild patellofemoral degenerative changes seen as well. No joint effusion is noted. No fracture or dislocation is seen.   IMPRESSION: Mild degenerative change without acute abnormality.     Electronically Signed   By: Inez Catalina M.D.   On: 12/04/2020 10:38  I, Lynne Leader, personally (independently) visualized and performed the interpretation of the images attached in this note.    Assessment and Plan: 70 y.o. female with bilateral knee pain due to DJD exacerbation.  Significant improvement with physical  therapy.  Patient is pretty satisfied with her improvement at about 80%.  Discussed potential future options including steroid injection or hyaluronic acid injection.  After discussion plan to continue home exercise program and Voltaren gel.  Certainly can proceed with steroid injection or hyaluronic acid injection in future if needed.  Recheck back as needed.  Total encounter time 20 minutes including face-to-face time with the patient and, reviewing past medical record, and charting on the date of service.   Treatment plan and options  Discussed warning signs or symptoms. Please see discharge instructions. Patient expresses understanding.   The above documentation has been reviewed and is accurate and complete Lynne Leader, M.D.

## 2021-03-11 ENCOUNTER — Other Ambulatory Visit: Payer: Self-pay

## 2021-03-11 ENCOUNTER — Ambulatory Visit: Payer: Medicare HMO | Admitting: Family Medicine

## 2021-03-11 VITALS — BP 118/76 | HR 75 | Ht 61.5 in | Wt 150.6 lb

## 2021-03-11 DIAGNOSIS — M25561 Pain in right knee: Secondary | ICD-10-CM | POA: Diagnosis not present

## 2021-03-11 DIAGNOSIS — M170 Bilateral primary osteoarthritis of knee: Secondary | ICD-10-CM

## 2021-03-11 DIAGNOSIS — M25562 Pain in left knee: Secondary | ICD-10-CM | POA: Diagnosis not present

## 2021-03-11 DIAGNOSIS — G8929 Other chronic pain: Secondary | ICD-10-CM

## 2021-03-11 NOTE — Patient Instructions (Addendum)
Thank you for coming in today.   Continue working on your home exercises.  Recheck back as needed.  Let me know if you need those injections.

## 2021-03-25 DIAGNOSIS — J301 Allergic rhinitis due to pollen: Secondary | ICD-10-CM | POA: Diagnosis not present

## 2021-03-25 DIAGNOSIS — J3089 Other allergic rhinitis: Secondary | ICD-10-CM | POA: Diagnosis not present

## 2021-03-25 DIAGNOSIS — J3081 Allergic rhinitis due to animal (cat) (dog) hair and dander: Secondary | ICD-10-CM | POA: Diagnosis not present

## 2021-04-14 DIAGNOSIS — J301 Allergic rhinitis due to pollen: Secondary | ICD-10-CM | POA: Diagnosis not present

## 2021-04-14 DIAGNOSIS — J3089 Other allergic rhinitis: Secondary | ICD-10-CM | POA: Diagnosis not present

## 2021-04-14 DIAGNOSIS — J3081 Allergic rhinitis due to animal (cat) (dog) hair and dander: Secondary | ICD-10-CM | POA: Diagnosis not present

## 2021-04-28 DIAGNOSIS — J301 Allergic rhinitis due to pollen: Secondary | ICD-10-CM | POA: Diagnosis not present

## 2021-04-28 DIAGNOSIS — J3089 Other allergic rhinitis: Secondary | ICD-10-CM | POA: Diagnosis not present

## 2021-04-28 DIAGNOSIS — J3081 Allergic rhinitis due to animal (cat) (dog) hair and dander: Secondary | ICD-10-CM | POA: Diagnosis not present

## 2021-05-20 DIAGNOSIS — J3089 Other allergic rhinitis: Secondary | ICD-10-CM | POA: Diagnosis not present

## 2021-05-20 DIAGNOSIS — J301 Allergic rhinitis due to pollen: Secondary | ICD-10-CM | POA: Diagnosis not present

## 2021-05-20 DIAGNOSIS — J3081 Allergic rhinitis due to animal (cat) (dog) hair and dander: Secondary | ICD-10-CM | POA: Diagnosis not present

## 2021-06-03 DIAGNOSIS — D2262 Melanocytic nevi of left upper limb, including shoulder: Secondary | ICD-10-CM | POA: Diagnosis not present

## 2021-06-03 DIAGNOSIS — L821 Other seborrheic keratosis: Secondary | ICD-10-CM | POA: Diagnosis not present

## 2021-06-03 DIAGNOSIS — D2272 Melanocytic nevi of left lower limb, including hip: Secondary | ICD-10-CM | POA: Diagnosis not present

## 2021-06-03 DIAGNOSIS — L738 Other specified follicular disorders: Secondary | ICD-10-CM | POA: Diagnosis not present

## 2021-06-03 DIAGNOSIS — L814 Other melanin hyperpigmentation: Secondary | ICD-10-CM | POA: Diagnosis not present

## 2021-06-03 DIAGNOSIS — L720 Epidermal cyst: Secondary | ICD-10-CM | POA: Diagnosis not present

## 2021-06-03 DIAGNOSIS — D2271 Melanocytic nevi of right lower limb, including hip: Secondary | ICD-10-CM | POA: Diagnosis not present

## 2021-06-03 DIAGNOSIS — D225 Melanocytic nevi of trunk: Secondary | ICD-10-CM | POA: Diagnosis not present

## 2021-06-03 DIAGNOSIS — L820 Inflamed seborrheic keratosis: Secondary | ICD-10-CM | POA: Diagnosis not present

## 2021-06-03 DIAGNOSIS — D2261 Melanocytic nevi of right upper limb, including shoulder: Secondary | ICD-10-CM | POA: Diagnosis not present

## 2021-06-09 DIAGNOSIS — J3081 Allergic rhinitis due to animal (cat) (dog) hair and dander: Secondary | ICD-10-CM | POA: Diagnosis not present

## 2021-06-09 DIAGNOSIS — J3089 Other allergic rhinitis: Secondary | ICD-10-CM | POA: Diagnosis not present

## 2021-06-09 DIAGNOSIS — J301 Allergic rhinitis due to pollen: Secondary | ICD-10-CM | POA: Diagnosis not present

## 2021-06-13 ENCOUNTER — Other Ambulatory Visit: Payer: Self-pay | Admitting: Gastroenterology

## 2021-07-01 DIAGNOSIS — J301 Allergic rhinitis due to pollen: Secondary | ICD-10-CM | POA: Diagnosis not present

## 2021-07-01 DIAGNOSIS — J3089 Other allergic rhinitis: Secondary | ICD-10-CM | POA: Diagnosis not present

## 2021-07-01 DIAGNOSIS — J3081 Allergic rhinitis due to animal (cat) (dog) hair and dander: Secondary | ICD-10-CM | POA: Diagnosis not present

## 2021-07-14 DIAGNOSIS — J3089 Other allergic rhinitis: Secondary | ICD-10-CM | POA: Diagnosis not present

## 2021-07-14 DIAGNOSIS — J301 Allergic rhinitis due to pollen: Secondary | ICD-10-CM | POA: Diagnosis not present

## 2021-07-14 DIAGNOSIS — J3081 Allergic rhinitis due to animal (cat) (dog) hair and dander: Secondary | ICD-10-CM | POA: Diagnosis not present

## 2021-07-24 DIAGNOSIS — J3089 Other allergic rhinitis: Secondary | ICD-10-CM | POA: Diagnosis not present

## 2021-07-24 DIAGNOSIS — J3081 Allergic rhinitis due to animal (cat) (dog) hair and dander: Secondary | ICD-10-CM | POA: Diagnosis not present

## 2021-07-24 DIAGNOSIS — J301 Allergic rhinitis due to pollen: Secondary | ICD-10-CM | POA: Diagnosis not present

## 2021-07-30 DIAGNOSIS — J301 Allergic rhinitis due to pollen: Secondary | ICD-10-CM | POA: Diagnosis not present

## 2021-07-30 DIAGNOSIS — J3089 Other allergic rhinitis: Secondary | ICD-10-CM | POA: Diagnosis not present

## 2021-07-30 DIAGNOSIS — J3081 Allergic rhinitis due to animal (cat) (dog) hair and dander: Secondary | ICD-10-CM | POA: Diagnosis not present

## 2021-08-02 NOTE — Progress Notes (Signed)
?Cardiology Office Note:   ? ?Date:  08/12/21 ? ?ID:  Kimberly Schroeder, DOB [birth year], MRN [id number] ? ?PCP:  Burnis Medin, MD  ?Cardiologist:  None  ?Electrophysiologist:  None  ? ?Referring MD: Burnis Medin, MD  ? ?Chief Complaint/Reason for Referral: ?Chest pain, elevated BP ? ?History of Present Illness:   ? ?Kimberly Schroeder is a 71 y.o. female with a history of GERD, HLD who presents for follow up evaluation of chest pain and newly elevated BP.  ? ?We did a thorough evaluation all of which came back very reassuring. Normal CCTA and normal renal ultrasound. Echo grossly normal with borderline LVH and G1DD. We discussed that these findings on echo are probably secondary to elevated BP. She is tolerating amlodipine 2.5 mg daily with normal BP today. Feeling well overall, no concerns today.  ? ?The patient denies chest pain, chest pressure, dyspnea at rest or with exertion, palpitations, PND, orthopnea, or leg swelling. Denies cough, fever, chills. Denies nausea, vomiting. Denies syncope or presyncope. Denies dizziness or lightheadedness. ? ?Past Medical History:  ?Diagnosis Date  ? Allergic rhinitis   ? allergist evaluation spring fall and dust.  ? Ectopic pregnancy   ? x 2  ? GERD (gastroesophageal reflux disease)   ? Hematuria, microscopic   ? eval 2001  Dr Terance Hart  ? ? ?Past Surgical History:  ?Procedure Laterality Date  ? LAPAROSCOPY    ? ectopic pregnancy  ? LAPAROSCOPY FOR ECTOPIC PREGNANCY    ? x2  ? ? ?Current Medications: ?No outpatient medications have been marked as taking for the 08/12/21 encounter (Appointment) with Elouise Munroe, MD.  ?  ? ?Allergies:   Iohexol  ? ?Social History  ? ?Tobacco Use  ? Smoking status: Never  ? Smokeless tobacco: Never  ?Vaping Use  ? Vaping Use: Never used  ?Substance Use Topics  ? Alcohol use: Yes  ?  Comment: occasional wine  ? Drug use: No  ?  ? ?Family History: ?The patient's family history includes Arthritis in an other family member; Colon cancer in  her paternal uncle; Colon cancer (age of onset: 57) in her father; Hypertension in an other family member; Parkinsonism in an other family member. There is no history of Pancreatic cancer, Prostate cancer, Rectal cancer, or Stomach cancer. ? ?ROS:   ?Please see the history of present illness.    ?All other systems reviewed and are negative. ? ?EKGs/Labs/Other Studies Reviewed:   ? ?The following studies were reviewed today: ? ?EKG: NSR ? ?Recent Labs: ?11/24/2020: ALT 19; BUN 17; Creatinine, Ser 0.87; Hemoglobin 15.1; Platelets 312.0; Potassium 4.3; Sodium 141  ?Recent Lipid Panel ?   ?Component Value Date/Time  ? CHOL 208 (H) 11/24/2020 1048  ? TRIG 79.0 11/24/2020 1048  ? TRIG 99 04/11/2006 0806  ? HDL 62.40 11/24/2020 1048  ? CHOLHDL 3 11/24/2020 1048  ? VLDL 15.8 11/24/2020 1048  ? LDLCALC 130 (H) 11/24/2020 1048  ? LDLDIRECT 132.6 04/16/2011 0930  ? ? ?Physical Exam:   ? ?VS:  BP 120/80   Pulse 64   Ht '5\' 2"'$  (1.575 m)   Wt 149 lb 9.6 oz (67.9 kg)   SpO2 99%   BMI 27.36 kg/m?    ? ?Wt Readings from Last 5 Encounters:  ?03/11/21 150 lb 9.6 oz (68.3 kg)  ?12/03/20 148 lb (67.1 kg)  ?11/24/20 146 lb 3.2 oz (66.3 kg)  ?06/20/20 150 lb (68 kg)  ?05/27/20 151 lb 3.2 oz (68.6 kg)  ?  ?  Constitutional: No acute distress ?Eyes: sclera non-icteric, normal conjunctiva and lids ?ENMT: normal dentition, moist mucous membranes ?Cardiovascular: regular rhythm, normal rate, no murmurs. S1 and S2 normal. Radial pulses normal bilaterally. No jugular venous distention.  ?Respiratory: clear to auscultation bilaterally ?GI : normal bowel sounds, soft and nontender. No distention.   ?MSK: extremities warm, well perfused. No edema.  ?NEURO: grossly nonfocal exam, moves all extremities. ?PSYCH: alert and oriented x 3, normal mood and affect.  ? ?ASSESSMENT:   ? ?1. Precordial pain   ?2. Hypertension, unspecified type   ?3. Pure hypercholesterolemia   ?4. Cardiac risk counseling   ? ? ?PLAN:   ? ?Precordial pain ?- normal CCTA. Chest  pain has resolved. Monitor symptoms. ? ?Hypertension, unspecified type ?- BP well controlled on single low dose agent, continue amlodipine 2.5 mg daily. Normal renal ultrasound.  ? ?Pure hypercholesterolemia ?- LDL 130, trig 79, HDL 62. Discussed in shared decision making, will start crestor 5 mg daily. If not well tolerated, can consider pravastatin given normal coronary CTA. Labs in 3 mo. ? ?Total time of encounter: ?30 minutes total time of encounter, including 18 minutes spent in face-to-face patient care on the date of this encounter. This time includes coordination of care and counseling regarding above mentioned problem list. Remainder of non-face-to-face time involved reviewing chart documents/testing relevant to the patient encounter and documentation in the medical record. I have independently reviewed documentation from referring provider.  ? ?Cherlynn Kaiser, MD, [facility] ?[place]  ? ? ?Medication Adjustments/Labs and Tests Ordered: ?Current medicines are reviewed at length with the patient today.  Concerns regarding medicines are outlined above.  ? ?Orders Placed This Encounter  ?Procedures  ? Lipid panel  ? Hepatic function panel  ? EKG 12-Lead  ? ? ?Meds ordered this encounter  ?Medications  ? rosuvastatin (CRESTOR) 5 MG tablet  ?  Sig: Take 1 tablet (5 mg total) by mouth daily.  ?  Dispense:  90 tablet  ?  Refill:  3  ? ? ?Patient Instructions  ?Medication Instructions:  ?START: CRESTOR (ROSUVASTATIN) '5mg'$  ONCE DAILY  ?*If you need a refill on your cardiac medications before your next appointment, please call your pharmacy* ? ?Lab Work: ?Please return for Blood Work in [place]. No appointment needed, lab here at the office is open Monday-Friday from 8AM to 4PM and closed daily for lunch from 12:45-1:45.  ? ?If you have labs (blood work) drawn today and your tests are completely normal, you will receive your results only by: ?MyChart Message (if you have MyChart) OR ?A paper copy in  the mail ?If you have any lab test that is abnormal or we need to change your treatment, we will call you to review the results. ? ?Follow-Up: ?At [facility], you and your health needs are our priority.  As part of our continuing mission to provide you with exceptional heart care, we have created designated Provider Care Teams.  These Care Teams include your primary Cardiologist (physician) and Advanced Practice Providers (APPs -  Physician Assistants and Nurse Practitioners) who all work together to provide you with the care you need, when you need it. ? ?Your next appointment:   ?1 year(s) ? ?The format for your next appointment:   ?In Person ? ?Provider:   ?Elouise Munroe, MD   ?  ? ?

## 2021-08-12 ENCOUNTER — Ambulatory Visit: Payer: Medicare HMO | Admitting: Internal Medicine

## 2021-08-12 ENCOUNTER — Encounter: Payer: Self-pay | Admitting: Internal Medicine

## 2021-08-12 ENCOUNTER — Other Ambulatory Visit: Payer: Self-pay

## 2021-08-12 VITALS — BP 120/80 | HR 64 | Ht 62.0 in | Wt 149.6 lb

## 2021-08-12 DIAGNOSIS — I10 Essential (primary) hypertension: Secondary | ICD-10-CM

## 2021-08-12 DIAGNOSIS — R072 Precordial pain: Secondary | ICD-10-CM | POA: Diagnosis not present

## 2021-08-12 DIAGNOSIS — Z7189 Other specified counseling: Secondary | ICD-10-CM | POA: Diagnosis not present

## 2021-08-12 DIAGNOSIS — E7800 Pure hypercholesterolemia, unspecified: Secondary | ICD-10-CM | POA: Diagnosis not present

## 2021-08-12 DIAGNOSIS — Z79899 Other long term (current) drug therapy: Secondary | ICD-10-CM | POA: Diagnosis not present

## 2021-08-12 MED ORDER — ROSUVASTATIN CALCIUM 5 MG PO TABS: 5.0000 mg | ORAL_TABLET | Freq: Every day | ORAL | 3 refills | Status: DC

## 2021-08-12 NOTE — Patient Instructions (Signed)
Medication Instructions:  ?START: CRESTOR (ROSUVASTATIN) '5mg'$  ONCE DAILY  ?*If you need a refill on your cardiac medications before your next appointment, please call your pharmacy* ? ?Lab Work: ?Please return for Blood Work in [place]. No appointment needed, lab here at the office is open Monday-Friday from 8AM to 4PM and closed daily for lunch from 12:45-1:45.  ? ?If you have labs (blood work) drawn today and your tests are completely normal, you will receive your results only by: ?MyChart Message (if you have MyChart) OR ?A paper copy in the mail ?If you have any lab test that is abnormal or we need to change your treatment, we will call you to review the results. ? ?Follow-Up: ?At [facility], you and your health needs are our priority.  As part of our continuing mission to provide you with exceptional heart care, we have created designated Provider Care Teams.  These Care Teams include your primary Cardiologist (physician) and Advanced Practice Providers (APPs -  Physician Assistants and Nurse Practitioners) who all work together to provide you with the care you need, when you need it. ? ?Your next appointment:   ?1 year(s) ? ?The format for your next appointment:   ?In Person ? ?Provider:   ?Elouise Munroe, MD   ? ?

## 2021-08-14 DIAGNOSIS — J3081 Allergic rhinitis due to animal (cat) (dog) hair and dander: Secondary | ICD-10-CM | POA: Diagnosis not present

## 2021-08-14 DIAGNOSIS — J301 Allergic rhinitis due to pollen: Secondary | ICD-10-CM | POA: Diagnosis not present

## 2021-08-14 DIAGNOSIS — J3089 Other allergic rhinitis: Secondary | ICD-10-CM | POA: Diagnosis not present

## 2021-08-18 DIAGNOSIS — J3089 Other allergic rhinitis: Secondary | ICD-10-CM | POA: Diagnosis not present

## 2021-08-18 DIAGNOSIS — J3081 Allergic rhinitis due to animal (cat) (dog) hair and dander: Secondary | ICD-10-CM | POA: Diagnosis not present

## 2021-08-18 DIAGNOSIS — J301 Allergic rhinitis due to pollen: Secondary | ICD-10-CM | POA: Diagnosis not present

## 2021-08-25 DIAGNOSIS — J301 Allergic rhinitis due to pollen: Secondary | ICD-10-CM | POA: Diagnosis not present

## 2021-08-25 DIAGNOSIS — J3081 Allergic rhinitis due to animal (cat) (dog) hair and dander: Secondary | ICD-10-CM | POA: Diagnosis not present

## 2021-08-25 DIAGNOSIS — J3089 Other allergic rhinitis: Secondary | ICD-10-CM | POA: Diagnosis not present

## 2021-09-08 DIAGNOSIS — J3081 Allergic rhinitis due to animal (cat) (dog) hair and dander: Secondary | ICD-10-CM | POA: Diagnosis not present

## 2021-09-08 DIAGNOSIS — J3089 Other allergic rhinitis: Secondary | ICD-10-CM | POA: Diagnosis not present

## 2021-09-08 DIAGNOSIS — J301 Allergic rhinitis due to pollen: Secondary | ICD-10-CM | POA: Diagnosis not present

## 2021-09-20 IMAGING — DX DG KNEE AP/LAT W/ SUNRISE*L*
3 series · 3 of 3 positions shown · non-contrast
Comparison: None.

CLINICAL DATA: Knee pain, no known injury, initial encounter

EXAM:
LEFT KNEE 3 VIEWS

[knee ap]
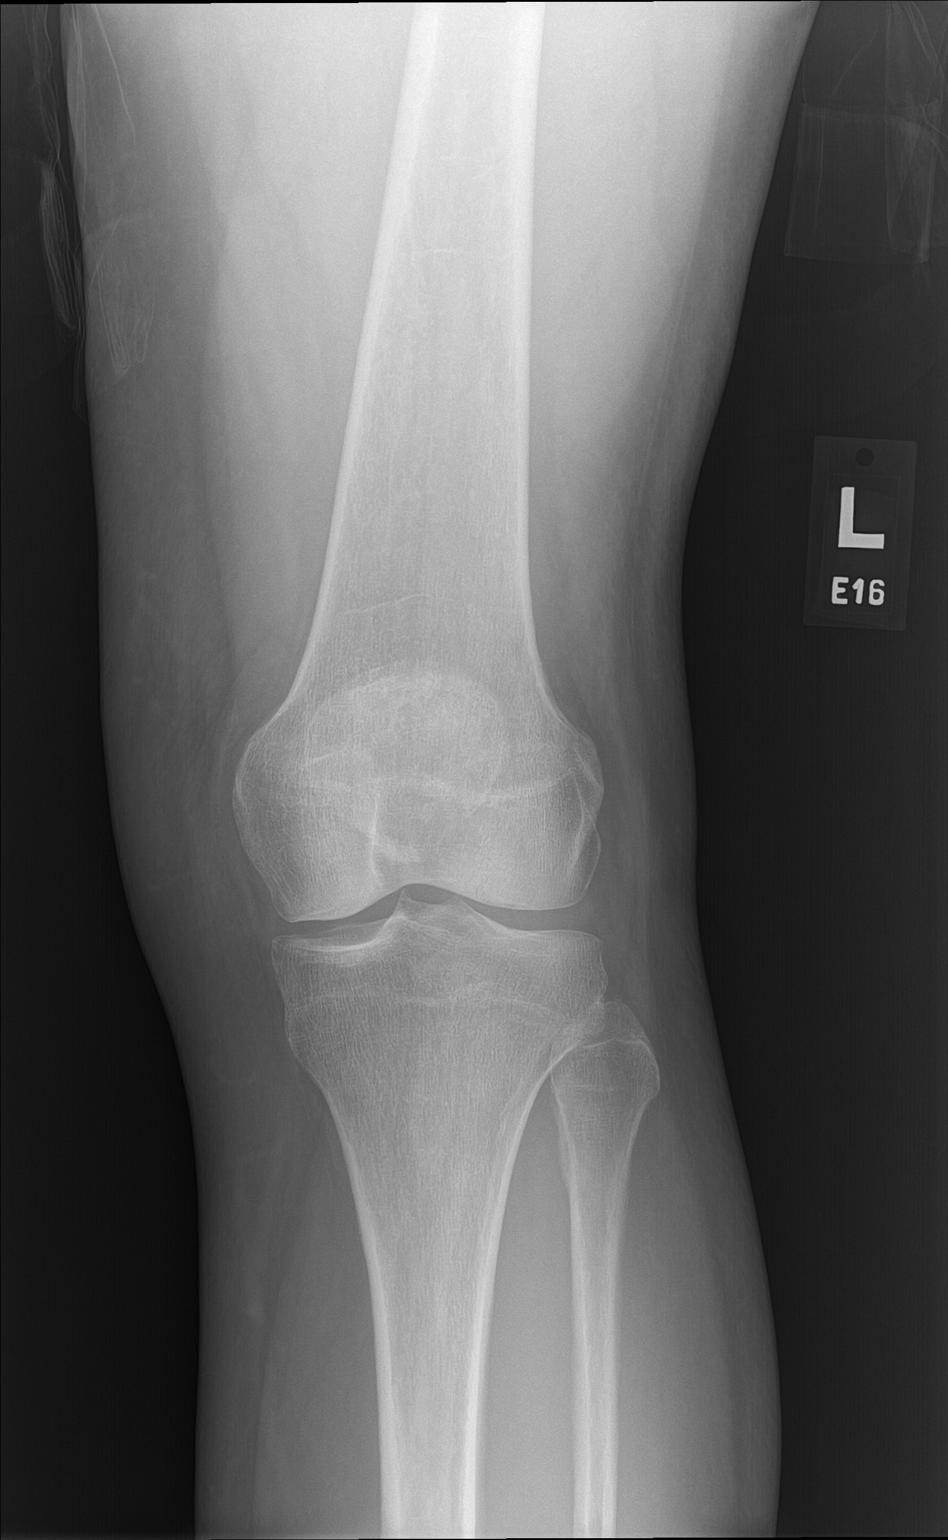

[knee lat]
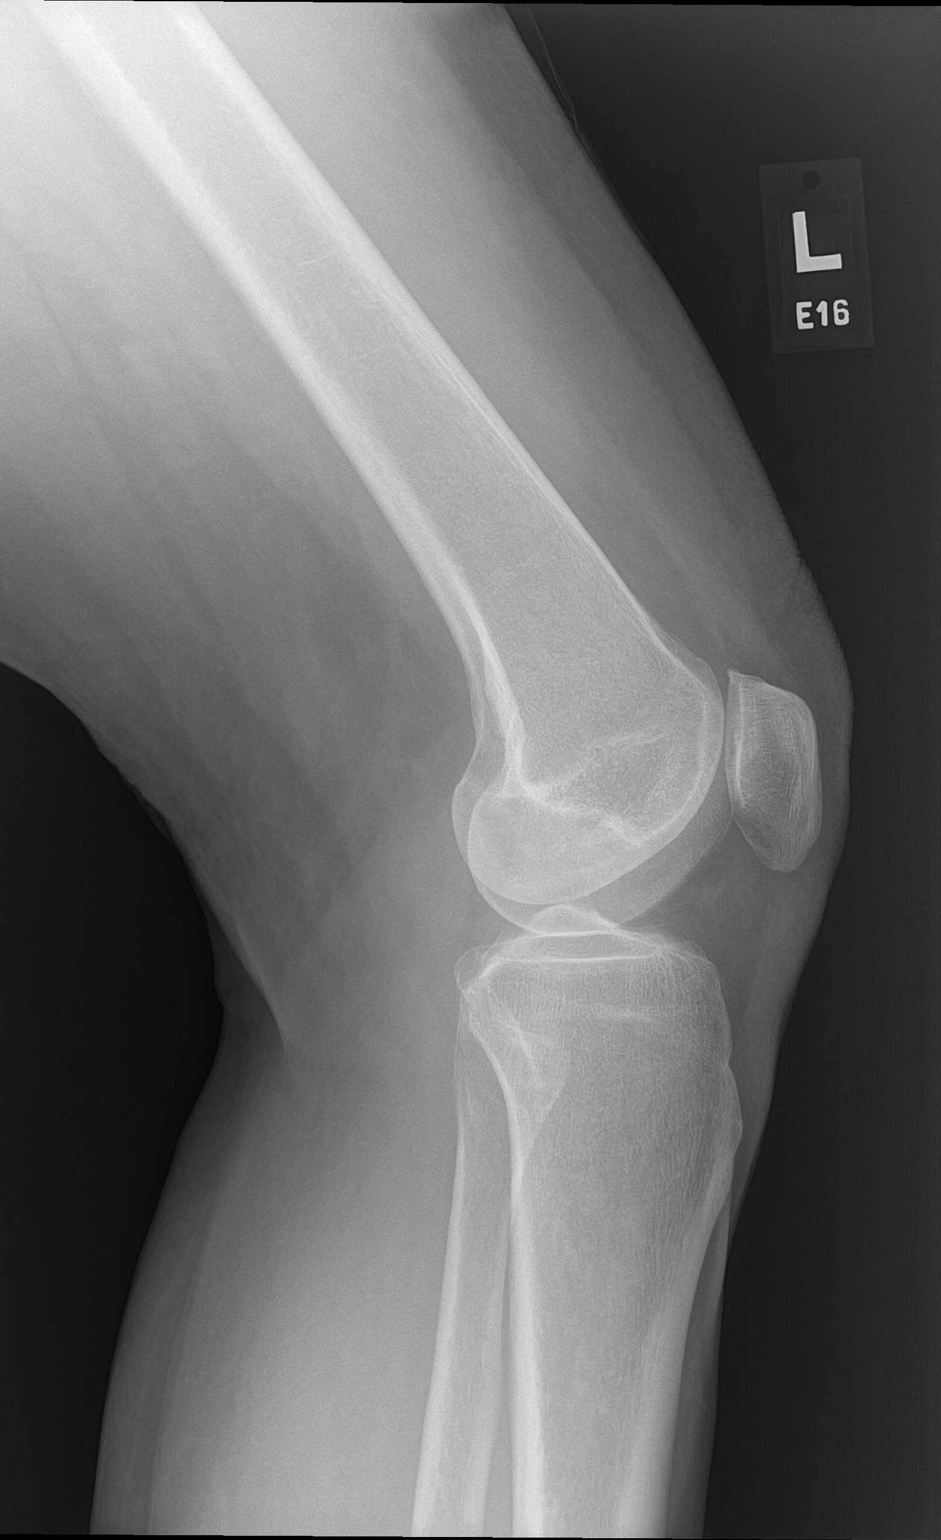

[patella]
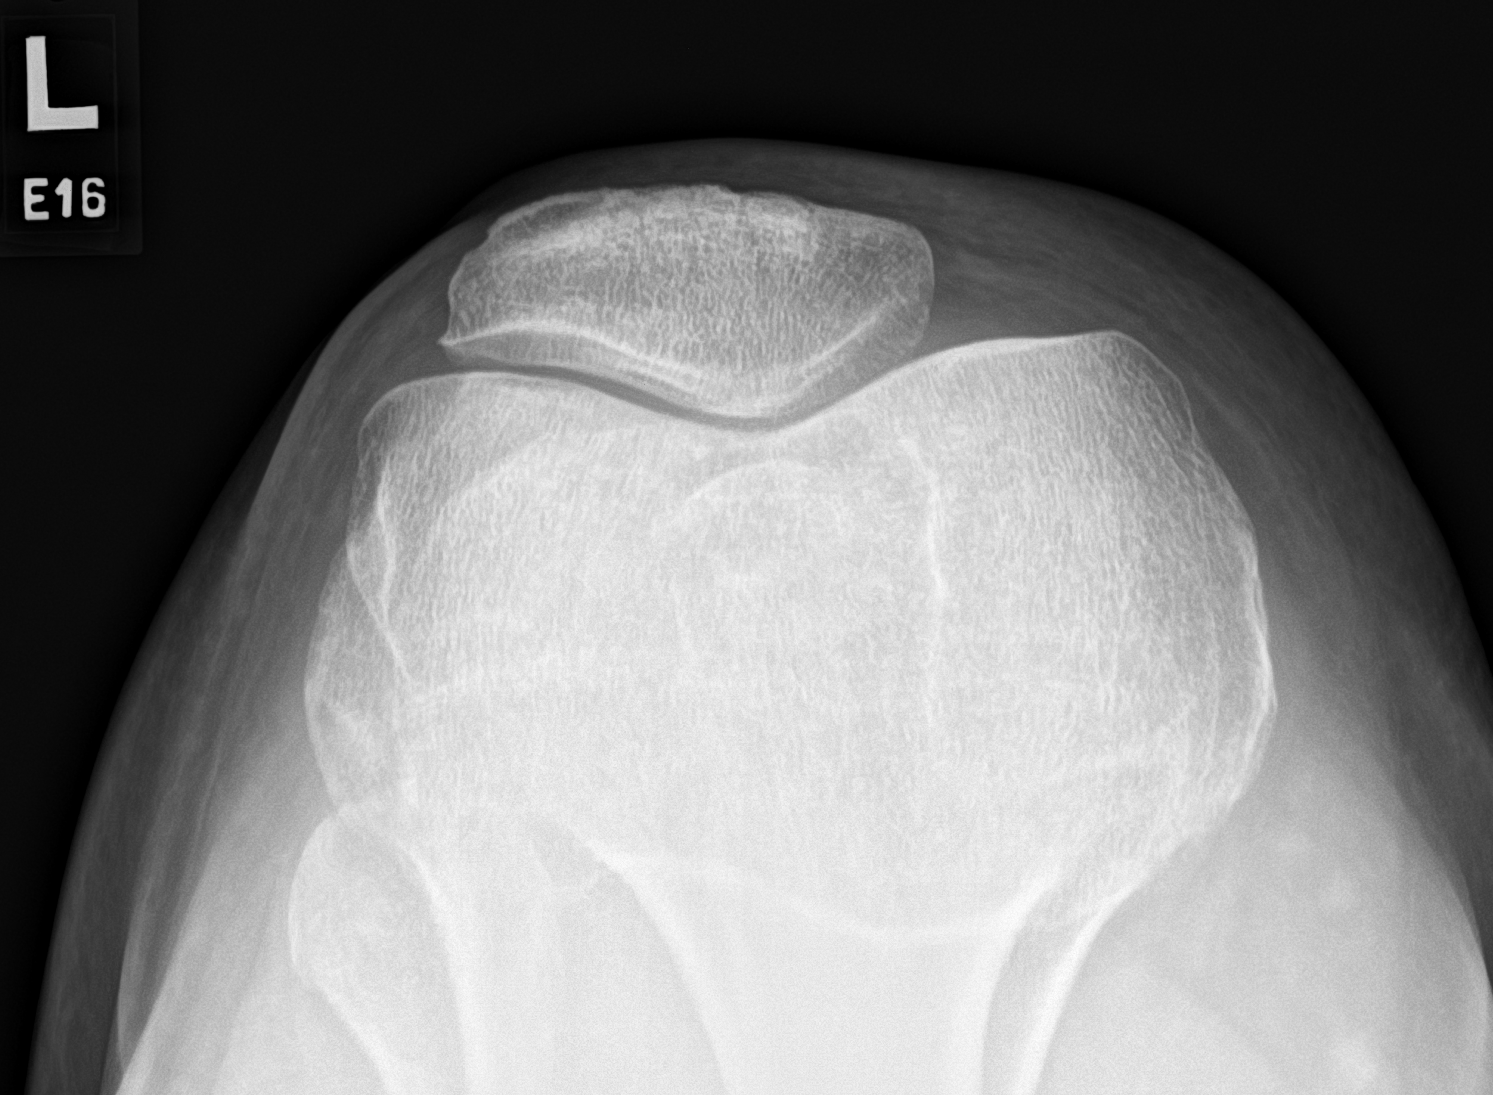

[3 of 3 positions shown; findings below may reference images not displayed]

FINDINGS: Mild medial joint space narrowing is noted. Mild patellofemoral
degenerative changes seen as well. No joint effusion is noted. No
fracture or dislocation is seen.
IMPRESSION: Mild degenerative change without acute abnormality.

## 2021-09-22 DIAGNOSIS — J301 Allergic rhinitis due to pollen: Secondary | ICD-10-CM | POA: Diagnosis not present

## 2021-09-22 DIAGNOSIS — J3081 Allergic rhinitis due to animal (cat) (dog) hair and dander: Secondary | ICD-10-CM | POA: Diagnosis not present

## 2021-09-22 DIAGNOSIS — J3089 Other allergic rhinitis: Secondary | ICD-10-CM | POA: Diagnosis not present

## 2021-09-28 DIAGNOSIS — Z01419 Encounter for gynecological examination (general) (routine) without abnormal findings: Secondary | ICD-10-CM | POA: Diagnosis not present

## 2021-09-28 DIAGNOSIS — Z1231 Encounter for screening mammogram for malignant neoplasm of breast: Secondary | ICD-10-CM | POA: Diagnosis not present

## 2021-10-07 DIAGNOSIS — J3081 Allergic rhinitis due to animal (cat) (dog) hair and dander: Secondary | ICD-10-CM | POA: Diagnosis not present

## 2021-10-07 DIAGNOSIS — J3089 Other allergic rhinitis: Secondary | ICD-10-CM | POA: Diagnosis not present

## 2021-10-07 DIAGNOSIS — J301 Allergic rhinitis due to pollen: Secondary | ICD-10-CM | POA: Diagnosis not present

## 2021-10-14 DIAGNOSIS — J301 Allergic rhinitis due to pollen: Secondary | ICD-10-CM | POA: Diagnosis not present

## 2021-10-14 DIAGNOSIS — J3081 Allergic rhinitis due to animal (cat) (dog) hair and dander: Secondary | ICD-10-CM | POA: Diagnosis not present

## 2021-10-14 DIAGNOSIS — J3089 Other allergic rhinitis: Secondary | ICD-10-CM | POA: Diagnosis not present

## 2021-10-20 DIAGNOSIS — J3081 Allergic rhinitis due to animal (cat) (dog) hair and dander: Secondary | ICD-10-CM | POA: Diagnosis not present

## 2021-10-20 DIAGNOSIS — J301 Allergic rhinitis due to pollen: Secondary | ICD-10-CM | POA: Diagnosis not present

## 2021-10-20 DIAGNOSIS — J3089 Other allergic rhinitis: Secondary | ICD-10-CM | POA: Diagnosis not present

## 2021-11-04 ENCOUNTER — Telehealth: Payer: Self-pay | Admitting: Internal Medicine

## 2021-11-04 DIAGNOSIS — J3089 Other allergic rhinitis: Secondary | ICD-10-CM | POA: Diagnosis not present

## 2021-11-04 DIAGNOSIS — J301 Allergic rhinitis due to pollen: Secondary | ICD-10-CM | POA: Diagnosis not present

## 2021-11-04 DIAGNOSIS — J3081 Allergic rhinitis due to animal (cat) (dog) hair and dander: Secondary | ICD-10-CM | POA: Diagnosis not present

## 2021-11-04 NOTE — Telephone Encounter (Signed)
Left message for patient to call back and schedule Medicare Annual Wellness Visit (AWV) either virtually or in office. Left  my jabber number [phone]   Last AWV ;11/04/20  please schedule at anytime with [facility] Nurse Health Advisor 1 or 2    

## 2021-11-06 ENCOUNTER — Telehealth: Payer: Self-pay | Admitting: Internal Medicine

## 2021-11-19 DIAGNOSIS — J3089 Other allergic rhinitis: Secondary | ICD-10-CM | POA: Diagnosis not present

## 2021-11-19 DIAGNOSIS — J301 Allergic rhinitis due to pollen: Secondary | ICD-10-CM | POA: Diagnosis not present

## 2021-11-19 DIAGNOSIS — J3081 Allergic rhinitis due to animal (cat) (dog) hair and dander: Secondary | ICD-10-CM | POA: Diagnosis not present

## 2021-11-20 ENCOUNTER — Encounter: Payer: Self-pay | Admitting: Internal Medicine

## 2021-11-23 DIAGNOSIS — J3081 Allergic rhinitis due to animal (cat) (dog) hair and dander: Secondary | ICD-10-CM | POA: Diagnosis not present

## 2021-11-23 DIAGNOSIS — J301 Allergic rhinitis due to pollen: Secondary | ICD-10-CM | POA: Diagnosis not present

## 2021-11-23 DIAGNOSIS — J3089 Other allergic rhinitis: Secondary | ICD-10-CM | POA: Diagnosis not present

## 2021-11-24 NOTE — Progress Notes (Unsigned)
No chief complaint on file.   HPI: Patient  Kimberly Schroeder  71 y.o. comes in today for Preventive Health Care visit   Health Maintenance  Topic Date Due   Zoster Vaccines- Shingrix (2 of 2) 01/24/2019   MAMMOGRAM  08/15/2020   COLONOSCOPY (Pts 45-53yr Insurance coverage will need to be confirmed)  07/08/2021   INFLUENZA VACCINE  12/15/2021   TETANUS/TDAP  03/30/2027   Pneumonia Vaccine 71 Years old  Completed   DEXA SCAN  Completed   COVID-19 Vaccine  Completed   HPV VACCINES  Aged Out   Health Maintenance Review LIFESTYLE:  Exercise:   Tobacco/ETS: Alcohol:  Sugar beverages: Sleep: Drug use: no HH of  Work:    ROS:  GEN/ HEENT: No fever, significant weight changes sweats headaches vision problems hearing changes, CV/ PULM; No chest pain shortness of breath cough, syncope,edema  change in exercise tolerance. GI /GU: No adominal pain, vomiting, change in bowel habits. No blood in the stool. No significant GU symptoms. SKIN/HEME: ,no acute skin rashes suspicious lesions or bleeding. No lymphadenopathy, nodules, masses.  NEURO/ PSYCH:  No neurologic signs such as weakness numbness. No depression anxiety. IMM/ Allergy: No unusual infections.  Allergy .   REST of 12 system review negative except as per HPI   Past Medical History:  Diagnosis Date   Allergic rhinitis    allergist evaluation spring fall and dust.   Ectopic pregnancy    x 2   GERD (gastroesophageal reflux disease)    Hematuria, microscopic    eval 2001  Dr PTerance Hart   Past Surgical History:  Procedure Laterality Date   LAPAROSCOPY     ectopic pregnancy   LAPAROSCOPY FOR ECTOPIC PREGNANCY     x2    Family History  Problem Relation Age of Onset   Colon cancer Father 666  Colon cancer Paternal Uncle        685's  Parkinsonism Other    Arthritis Other    Hypertension Other    Pancreatic cancer Neg Hx    Prostate cancer Neg Hx    Rectal cancer Neg Hx    Stomach cancer Neg Hx      Social History   Socioeconomic History   Marital status: Married    Spouse name: Not on file   Number of children: 0   Years of education: Not on file   Highest education level: Not on file  Occupational History   Occupation: retired  Tobacco Use   Smoking status: Never   Smokeless tobacco: Never  Vaping Use   Vaping Use: Never used  Substance and Sexual Activity   Alcohol use: Yes    Comment: occasional wine   Drug use: No   Sexual activity: Not on file  Other Topics Concern   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    Social Determinants of Health   Financial Resource Strain: Low Risk  (11/04/2020)   Overall Financial Resource Strain (CARDIA)    Difficulty of Paying Living Expenses: Not hard at all  Food Insecurity: No Food Insecurity (11/04/2020)   Hunger Vital Sign  Worried About Charity fundraiser in the Last Year: Never true    [place] in the Last Year: Never true  Transportation Needs: No Transportation Needs (11/04/2020)   PRAPARE - Hydrologist (Medical): No    Lack of Transportation (Non-Medical): No  Physical Activity: Sufficiently Active (11/04/2020)   Exercise Vital Sign    Days of Exercise per Week: 6 days    Minutes of Exercise per Session: 40 min  Stress: No Stress Concern Present (11/04/2020)   [place]    Feeling of Stress : Not at all  Social Connections: [place] (11/04/2020)   Social Connection and Isolation Panel [NHANES]    Frequency of Communication with Friends and Family: Twice a week    Frequency of Social Gatherings with Friends and Family: Twice a week    Attends  Religious Services: 1 to 4 times per year    Active Member of [employer] or Organizations: Yes    Attends Archivist Meetings: 1 to 4 times per year    Marital Status: Married    Outpatient Medications Prior to Visit  Medication Sig Dispense Refill   amLODipine (NORVASC) 2.5 MG tablet TAKE 1 TABLET BY MOUTH EVERY DAY FOR BLOOD PRESSURE 90 tablet 3   amoxicillin (AMOXIL) 500 MG capsule Take 500 mg by mouth 3 (three) times daily. (Patient not taking: Reported on 08/12/2021)     cetirizine (ZYRTEC) 10 MG tablet Take 10 mg by mouth daily.     Cholecalciferol (VITAMIN D3 MAXIMUM STRENGTH) 125 MCG (5000 UT) capsule Take 5,000 Units by mouth in the morning and at bedtime. With vitamin K and minerals     EPIPEN 2-PAK 0.3 MG/0.3ML SOAJ injection  (Patient not taking: Reported on 08/12/2021)  1   Estradiol 10 MCG TABS vaginal tablet Place 1 each vaginally once a week.     fish oil-omega-3 fatty acids 1000 MG capsule Take 2 g by mouth daily.     fluticasone (FLONASE) 50 MCG/ACT nasal spray INSTILL 1 SPRAY INTO BOTH NOSTRILS DAILY 48 mL 1   METAMUCIL MULTIHEALTH FIBER 63 % POWD Take by mouth daily in the afternoon.     omeprazole (PRILOSEC) 20 MG capsule TAKE 1 CAPSULE BY MOUTH TWICE A DAY BEFORE A MEAL 180 capsule 1   rosuvastatin (CRESTOR) 5 MG tablet Take 1 tablet (5 mg total) by mouth daily. 90 tablet 3   No facility-administered medications prior to visit.     EXAM:  There were no vitals taken for this visit.  There is no height or weight on file to calculate BMI. Wt Readings from Last 3 Encounters:  08/12/21 149 lb 9.6 oz (67.9 kg)  03/11/21 150 lb 9.6 oz (68.3 kg)  12/03/20 148 lb (67.1 kg)    Physical Exam: Vital signs reviewed WUJ:WJXB is a well-developed well-nourished alert cooperative    who appearsr stated age in no acute distress.  HEENT: normocephalic atraumatic , Eyes: PERRL EOM's full, conjunctiva clear, Nares: paten,t no deformity discharge or tenderness., Ears: no  deformity EAC's clear TMs with normal landmarks. Mouth: clear OP, no lesions, edema.  Moist mucous membranes. Dentition in adequate repair. NECK: supple without masses, thyromegaly or bruits. CHEST/PULM:  Clear to auscultation and percussion breath sounds equal no wheeze , rales or rhonchi. No chest wall deformities or tenderness. Breast: normal by inspection . No dimpling, discharge, masses, tenderness or discharge . CV: PMI is nondisplaced,  S1 S2 no gallops, murmurs, rubs. Peripheral pulses are full without delay.No JVD .  ABDOMEN: Bowel sounds normal nontender  No guard or rebound, no hepato splenomegal no CVA tenderness.  No hernia. Extremtities:  No clubbing cyanosis or edema, no acute joint swelling or redness no focal atrophy NEURO:  Oriented x3, cranial nerves 3-12 appear to be intact, no obvious focal weakness,gait within normal limits no abnormal reflexes or asymmetrical SKIN: No acute rashes normal turgor, color, no bruising or petechiae. PSYCH: Oriented, good eye contact, no obvious depression anxiety, cognition and judgment appear normal. LN: no cervical axillary inguinal adenopathy  Lab Results  Component Value Date   WBC 4.2 11/24/2020   HGB 15.1 (H) 11/24/2020   HCT 44.5 11/24/2020   PLT 312.0 11/24/2020   GLUCOSE 95 11/24/2020   CHOL 208 (H) 11/24/2020   TRIG 79.0 11/24/2020   HDL 62.40 11/24/2020   LDLDIRECT 132.6 04/16/2011   LDLCALC 130 (H) 11/24/2020   ALT 19 11/24/2020   AST 19 11/24/2020   NA 141 11/24/2020   K 4.3 11/24/2020   CL 105 11/24/2020   CREATININE 0.87 11/24/2020   BUN 17 11/24/2020   CO2 29 11/24/2020   TSH 1.94 03/22/2016   HGBA1C 5.4 11/24/2020    BP Readings from Last 3 Encounters:  08/12/21 120/80  03/11/21 118/76  12/03/20 124/84    Lab results reviewed with patient   ASSESSMENT AND PLAN:  Discussed the following assessment and plan:    ICD-10-CM   1. Visit for preventive health examination  Z00.00     2. Medication management   Z79.899      No follow-ups on file.  Patient Care Team: Nobie Alleyne, Standley Brooking, MD as PCP - General Elouise Munroe, MD as PCP - Cardiology (Cardiology) Mosetta Anis, MD (Allergy) Dian Queen, MD (Obstetrics and Gynecology) Mauri Pole, MD as Consulting Physician (Gastroenterology) [place], Amy, MD as Consulting Physician (Dermatology) There are no Patient Instructions on file for this visit.  Standley Brooking. Saraphina Lauderbaugh M.D.

## 2021-11-25 ENCOUNTER — Encounter: Payer: Self-pay | Admitting: Internal Medicine

## 2021-11-25 ENCOUNTER — Ambulatory Visit (INDEPENDENT_AMBULATORY_CARE_PROVIDER_SITE_OTHER): Payer: Medicare HMO | Admitting: Internal Medicine

## 2021-11-25 VITALS — BP 106/74 | HR 72 | Temp 97.8°F | Ht 61.75 in | Wt 147.8 lb

## 2021-11-25 DIAGNOSIS — R030 Elevated blood-pressure reading, without diagnosis of hypertension: Secondary | ICD-10-CM

## 2021-11-25 DIAGNOSIS — Z0000 Encounter for general adult medical examination without abnormal findings: Secondary | ICD-10-CM

## 2021-11-25 DIAGNOSIS — E7800 Pure hypercholesterolemia, unspecified: Secondary | ICD-10-CM | POA: Diagnosis not present

## 2021-11-25 DIAGNOSIS — Z79899 Other long term (current) drug therapy: Secondary | ICD-10-CM

## 2021-11-25 DIAGNOSIS — K219 Gastro-esophageal reflux disease without esophagitis: Secondary | ICD-10-CM

## 2021-11-25 LAB — CBC WITH DIFFERENTIAL/PLATELET
Basophils Absolute: 0.1 10*3/uL (ref 0.0–0.1)
Basophils Relative: 1.1 % (ref 0.0–3.0)
Eosinophils Absolute: 0.1 10*3/uL (ref 0.0–0.7)
Eosinophils Relative: 2.8 % (ref 0.0–5.0)
HCT: 35.5 % — ABNORMAL LOW (ref 36.0–46.0)
Hemoglobin: 11.7 g/dL — ABNORMAL LOW (ref 12.0–15.0)
Lymphocytes Relative: 38 % (ref 12.0–46.0)
Lymphs Abs: 2 10*3/uL (ref 0.7–4.0)
MCHC: 33 g/dL (ref 30.0–36.0)
MCV: 80.6 fl (ref 78.0–100.0)
Monocytes Absolute: 0.4 10*3/uL (ref 0.1–1.0)
Monocytes Relative: 7.7 % (ref 3.0–12.0)
Neutro Abs: 2.6 10*3/uL (ref 1.4–7.7)
Neutrophils Relative %: 50.4 % (ref 43.0–77.0)
Platelets: 328 10*3/uL (ref 150.0–400.0)
RBC: 4.4 Mil/uL (ref 3.87–5.11)
RDW: 17.2 % — ABNORMAL HIGH (ref 11.5–15.5)
WBC: 5.2 10*3/uL (ref 4.0–10.5)

## 2021-11-25 LAB — BASIC METABOLIC PANEL
BUN: 16 mg/dL (ref 6–23)
CO2: 29 mEq/L (ref 19–32)
Calcium: 9.8 mg/dL (ref 8.4–10.5)
Chloride: 107 mEq/L (ref 96–112)
Creatinine, Ser: 0.87 mg/dL (ref 0.40–1.20)
GFR: 68.16 mL/min (ref >60.00)
Glucose, Bld: 92 mg/dL (ref 70–99)
Potassium: 4.8 mEq/L (ref 3.5–5.1)
Sodium: 140 mEq/L (ref 135–145)

## 2021-11-25 LAB — HEPATIC FUNCTION PANEL
ALT: 23 U/L (ref 0–35)
AST: 23 U/L (ref 0–37)
Albumin: 4.3 g/dL (ref 3.5–5.2)
Alkaline Phosphatase: 51 U/L (ref 39–117)
Bilirubin, Direct: 0.1 mg/dL (ref 0.0–0.3)
Total Bilirubin: 0.4 mg/dL (ref 0.2–1.2)
Total Protein: 7.3 g/dL (ref 6.0–8.3)

## 2021-11-25 LAB — LIPID PANEL
Cholesterol: 130 mg/dL (ref 0–200)
HDL: 67.7 mg/dL (ref >39.00)
LDL Cholesterol: 54 mg/dL (ref 0–99)
NonHDL: 62.06
Total CHOL/HDL Ratio: 2
Triglycerides: 39 mg/dL (ref 0.0–149.0)
VLDL: 7.8 mg/dL (ref 0.0–40.0)

## 2021-11-25 LAB — TSH: TSH: 1.52 u[IU]/mL (ref 0.35–5.50)

## 2021-11-25 LAB — HEMOGLOBIN A1C: Hgb A1c MFr Bld: 5.8 % (ref 4.6–6.5)

## 2021-11-25 NOTE — Patient Instructions (Signed)
Good to see  you today  Continue lifestyle intervention healthy eating and exercise .  Lab today and will forward to cardiology . If allok yearly check

## 2021-11-26 NOTE — Telephone Encounter (Signed)
Oh  sure ;that is the  most cause of the results. No need to follow  up  unless you have concerns . Or get rejected from further blood donation. Because  hg is low.  Thanks for the info.

## 2021-11-30 DIAGNOSIS — J3089 Other allergic rhinitis: Secondary | ICD-10-CM | POA: Diagnosis not present

## 2021-11-30 DIAGNOSIS — J301 Allergic rhinitis due to pollen: Secondary | ICD-10-CM | POA: Diagnosis not present

## 2021-11-30 DIAGNOSIS — J3081 Allergic rhinitis due to animal (cat) (dog) hair and dander: Secondary | ICD-10-CM | POA: Diagnosis not present

## 2021-12-10 DIAGNOSIS — J3081 Allergic rhinitis due to animal (cat) (dog) hair and dander: Secondary | ICD-10-CM | POA: Diagnosis not present

## 2021-12-10 DIAGNOSIS — J3089 Other allergic rhinitis: Secondary | ICD-10-CM | POA: Diagnosis not present

## 2021-12-10 DIAGNOSIS — J301 Allergic rhinitis due to pollen: Secondary | ICD-10-CM | POA: Diagnosis not present

## 2021-12-24 DIAGNOSIS — J3089 Other allergic rhinitis: Secondary | ICD-10-CM | POA: Diagnosis not present

## 2021-12-24 DIAGNOSIS — J301 Allergic rhinitis due to pollen: Secondary | ICD-10-CM | POA: Diagnosis not present

## 2021-12-24 DIAGNOSIS — J3081 Allergic rhinitis due to animal (cat) (dog) hair and dander: Secondary | ICD-10-CM | POA: Diagnosis not present

## 2021-12-29 ENCOUNTER — Telehealth: Payer: Self-pay | Admitting: Internal Medicine

## 2021-12-29 NOTE — Telephone Encounter (Signed)
Left message for patient to call back and schedule Medicare Annual Wellness Visit (AWV) either virtually or in office. Left  my jabber number [phone]   Last AWV ;11/04/20  please schedule at anytime with [facility] Nurse Health Advisor 1 or 2    

## 2022-01-05 ENCOUNTER — Ambulatory Visit (INDEPENDENT_AMBULATORY_CARE_PROVIDER_SITE_OTHER): Payer: Medicare HMO

## 2022-01-05 VITALS — Ht 62.0 in | Wt 148.0 lb

## 2022-01-05 DIAGNOSIS — Z0000 Encounter for general adult medical examination without abnormal findings: Secondary | ICD-10-CM

## 2022-01-05 DIAGNOSIS — J3089 Other allergic rhinitis: Secondary | ICD-10-CM | POA: Diagnosis not present

## 2022-01-05 DIAGNOSIS — J3081 Allergic rhinitis due to animal (cat) (dog) hair and dander: Secondary | ICD-10-CM | POA: Diagnosis not present

## 2022-01-05 DIAGNOSIS — J301 Allergic rhinitis due to pollen: Secondary | ICD-10-CM | POA: Diagnosis not present

## 2022-01-05 NOTE — Progress Notes (Signed)
I connected with Kimberly Schroeder today by telephone and verified that I am speaking with the correct person using two identifiers. Location patient: home Location provider: work Persons participating in the virtual visit: Druscilla Petsch, Glenna Durand LPN.   I discussed the limitations, risks, security and privacy concerns of performing an evaluation and management service by telephone and the availability of in person appointments. I also discussed with the patient that there may be a patient responsible charge related to this service. The patient expressed understanding and verbally consented to this telephonic visit.    Interactive audio and video telecommunications were attempted between this provider and patient, however failed, due to patient having technical difficulties OR patient did not have access to video capability.  We continued and completed visit with audio only.     Vital signs may be patient reported or missing.  Subjective:   Kimberly Schroeder is a 71 y.o. female who presents for Medicare Annual (Subsequent) preventive examination.  Review of Systems     Cardiac Risk Factors include: advanced age (>34mn, >>38women);dyslipidemia     Objective:    Today's Vitals   01/05/22 1539  Weight: 148 lb (67.1 kg)  Height: '5\' 2"'$  (1.575 m)   Body mass index is 27.07 kg/m.     01/05/2022    3:42 PM 01/13/2021    9:11 PM 11/04/2020    9:03 AM 03/06/2020    4:23 PM 06/23/2016    7:57 AM 10/14/2015    1:36 PM  Advanced Directives  Does Patient Have a Medical Advance Directive? Yes No Yes No Yes Yes  Type of AParamedicof [place] will  [place]Living will  [place] will [place] will  Does patient want to make changes to medical advance directive?      Yes - information given  Copy of HPennwynin Chart? No - copy requested  No - copy requested   No -  copy requested  Would patient like information on creating a medical advance directive?  No - Patient declined        Current Medications (verified) Outpatient Encounter Medications as of 01/05/2022  Medication Sig   amLODipine (NORVASC) 2.5 MG tablet TAKE 1 TABLET BY MOUTH EVERY DAY FOR BLOOD PRESSURE   cetirizine (ZYRTEC) 10 MG tablet Take 10 mg by mouth daily.   Cholecalciferol (VITAMIN D3 MAXIMUM STRENGTH) 125 MCG (5000 UT) capsule Take 5,000 Units by mouth in the morning and at bedtime. With vitamin K and minerals   EPIPEN 2-PAK 0.3 MG/0.3ML SOAJ injection    Estradiol 10 MCG TABS vaginal tablet Place 1 each vaginally once a week.   fish oil-omega-3 fatty acids 1000 MG capsule Take 2 g by mouth daily.   fluticasone (FLONASE) 50 MCG/ACT nasal spray INSTILL 1 SPRAY INTO BOTH NOSTRILS DAILY   METAMUCIL MULTIHEALTH FIBER 63 % POWD Take by mouth daily in the afternoon.   omeprazole (PRILOSEC) 20 MG capsule TAKE 1 CAPSULE BY MOUTH TWICE A DAY BEFORE A MEAL (Patient taking differently: daily. TAKE 1 CAPSULE BY MOUTH ONCE A DAY BEFORE A MEAL)   rosuvastatin (CRESTOR) 5 MG tablet Take 1 tablet (5 mg total) by mouth daily.   No facility-administered encounter medications on file as of 01/05/2022.    Allergies (verified) Iohexol   History: Past Medical History:  Diagnosis Date   Allergic rhinitis    allergist evaluation spring fall and dust.   Ectopic  pregnancy    x 2   GERD (gastroesophageal reflux disease)    Hematuria, microscopic    eval 2001  Dr Terance Hart   Past Surgical History:  Procedure Laterality Date   LAPAROSCOPY     ectopic pregnancy   LAPAROSCOPY FOR ECTOPIC PREGNANCY     x2   Family History  Problem Relation Age of Onset   Colon cancer Father 28   Colon cancer Paternal Uncle        54's   Parkinsonism Other    Arthritis Other    Hypertension Other    Pancreatic cancer Neg Hx    Prostate cancer Neg Hx    Rectal cancer Neg Hx    Stomach cancer Neg Hx     Social History   Socioeconomic History   Marital status: Married    Spouse name: Not on file   Number of children: 0   Years of education: Not on file   Highest education level: Not on file  Occupational History   Occupation: retired  Tobacco Use   Smoking status: Never   Smokeless tobacco: Never  Vaping Use   Vaping Use: Never used  Substance and Sexual Activity   Alcohol use: Yes    Comment: occasional wine   Drug use: No   Sexual activity: Not on file  Other Topics Concern   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    Social Determinants of Health   Financial Resource Strain: Low Risk  (01/05/2022)   Overall Financial Resource Strain (CARDIA)    Difficulty of Paying Living Expenses: Not hard at all  Food Insecurity: No Food Insecurity (01/05/2022)   Hunger Vital Sign    Worried About Running Out of Food in the Last Year: Never true    Ran Out of Food in the Last Year: Never true  Transportation Needs: No Transportation Needs (01/05/2022)   PRAPARE - Hydrologist (Medical): No    Lack of Transportation (Non-Medical): No  Physical Activity: Sufficiently Active (01/05/2022)   Exercise Vital Sign    Days of Exercise per Week: 6 days    Minutes of Exercise per Session: 60 min  Stress: No Stress Concern Present (01/05/2022)   [place]    Feeling of Stress : Not at all  Social Connections: [place] (11/04/2020)   Social Connection and Isolation Panel [NHANES]    Frequency of Communication with Friends and Family: Twice a week    Frequency of Social Gatherings with Friends and Family: Twice a week    Attends  Religious Services: 1 to 4 times per year    Active Member of [employer] or Organizations: Yes    Attends Archivist Meetings: 1 to 4 times per year    Marital Status: Married    Tobacco Counseling Counseling given: Not Answered   Clinical Intake:  Pre-visit preparation completed: Yes  Pain : No/denies pain     Nutritional Status: BMI 25 -29 Overweight Nutritional Risks: None Diabetes: No  How often do you need to have someone help you when you read instructions,  pamphlets, or other written materials from your doctor or pharmacy?: 1 - Never What is the last grade level you completed in school?: college  Diabetic? no  Interpreter Needed?: No  Information entered by :: NAllen LPN   Activities of Daily Living    01/05/2022    3:43 PM 01/01/2022    6:36 PM  In your present state of health, do you have any difficulty performing the following activities:  Hearing? 0 0  Vision? 0 0  Difficulty concentrating or making decisions? 0 0  Walking or climbing stairs? 0 0  Dressing or bathing? 0 0  Doing errands, shopping? 0 0  Preparing Food and eating ? N N  Using the Toilet? N N  In the past six months, have you accidently leaked urine? N N  Do you have problems with loss of bowel control? N N  Managing your Medications? N N  Managing your Finances? N N  Housekeeping or managing your Housekeeping? N N    Patient Care Team: Panosh, Standley Brooking, MD as PCP - General Elouise Munroe, MD as PCP - Cardiology (Cardiology) Mosetta Anis, MD (Allergy) Dian Queen, MD (Obstetrics and Gynecology) Mauri Pole, MD as Consulting Physician (Gastroenterology) Martinique, Amy, MD as Consulting Physician (Dermatology)  Indicate any recent Medical Services you may have received from other than [facility] providers in the past year (date may be approximate).     Assessment:   This is a routine wellness examination for Kimberly Schroeder.  Hearing/Vision screen Vision Screening -  Comments:: Regular eye exams, [place] issues and exercise activities discussed: Current Exercise Habits: Home exercise routine, Type of exercise: walking, Time (Minutes): 60, Frequency (Times/Week): 6, Weekly Exercise (Minutes/Week): 360   Goals Addressed             This Visit's Progress    Patient Stated       01/05/2022, wants to maintain       Depression Screen    01/05/2022    3:43 PM 11/25/2021   11:52 AM 11/24/2020   11:23 AM 10/12/2019   10:27 AM 04/04/2018    8:57 AM 03/29/2017    9:04 AM  PHQ 2/9 Scores  PHQ - 2 Score 0 0 0 0 0 0  PHQ- 9 Score  0 0 0      Fall Risk    01/05/2022    3:43 PM 01/01/2022    6:36 PM 11/25/2021   11:52 AM 11/04/2020    9:04 AM 10/12/2019   10:27 AM  [place] in the past year? 0 0 0 0 0  Number falls in past yr: 0 0 0 0   Injury with Fall? 0  0 0   Risk for fall due to : Medication side effect  No Fall Risks    Follow up Education provided;Falls prevention discussed;Falls evaluation completed  Falls prevention discussed Falls evaluation completed     FALL RISK PREVENTION PERTAINING TO THE HOME:  Any stairs in or around the home? Yes  If so, are there any without handrails? No  Home free of loose throw rugs in walkways, pet beds, electrical cords, etc? Yes  Adequate lighting in your home to reduce risk of falls? Yes   ASSISTIVE DEVICES UTILIZED TO PREVENT FALLS:  Life alert? No  Use of a cane, walker or w/c? No  Grab bars in the bathroom? No  Shower chair or bench in shower? Yes  Elevated toilet seat or a  handicapped toilet? No   TIMED UP AND GO:  Was the test performed? No .      Cognitive Function:        01/05/2022    3:44 PM  6CIT Screen  What Year? 0 points  What month? 0 points  What time? 0 points  Count back from 20 0 points  Months in reverse 0 points  Repeat phrase 2 points  Total Score 2 points    Immunizations Immunization History  Administered Date(s) Administered    Fluad Quad(high Dose 65+) 01/17/2019, 02/08/2020, 03/09/2021   Influenza Split 02/17/2011   Influenza, High Dose Seasonal PF 03/09/2018   Influenza,inj,Quad PF,6+ Mos 01/25/2013, 02/12/2014, 02/23/2016, 03/04/2017   PFIZER(Purple Top)SARS-COV-2 Vaccination 06/07/2019, 06/28/2019, 04/16/2020   Pneumococcal Conjugate-13 04/04/2018   Pneumococcal Polysaccharide-23 10/12/2019   Td 04/28/2006   Tdap 03/29/2017   Zoster Recombinat (Shingrix) 11/29/2018   Zoster, Live 11/20/2012, 06/18/2019    TDAP status: Up to date  Flu Vaccine status: Due, Education has been provided regarding the importance of this vaccine. Advised may receive this vaccine at local pharmacy or Health Dept. Aware to provide a copy of the vaccination record if obtained from local pharmacy or Health Dept. Verbalized acceptance and understanding.  Pneumococcal vaccine status: Up to date  Covid-19 vaccine status: Completed vaccines  Qualifies for Shingles Vaccine? Yes   Zostavax completed Yes   Shingrix Completed?: Yes  Screening Tests Health Maintenance  Topic Date Due   COVID-19 Vaccine (7 - Mixed Product risk series) 06/11/2020   INFLUENZA VACCINE  12/15/2021   COLONOSCOPY (Pts 45-56yr Insurance coverage will need to be confirmed)  05/28/2022 (Originally 07/08/2021)   Zoster Vaccines- Shingrix (2 of 2) 05/28/2022 (Originally 01/24/2019)   MAMMOGRAM  08/16/2022   TETANUS/TDAP  03/30/2027   Pneumonia Vaccine 69 Years old  Completed   DEXA SCAN  Completed   HPV VACCINES  Aged Out    Health Maintenance  Health Maintenance Due  Topic Date Due   COVID-19 Vaccine (7 - Mixed Product risk series) 06/11/2020   INFLUENZA VACCINE  12/15/2021    Colorectal cancer screening: Type of screening: Colonoscopy. Completed 07/08/2016. Repeat every 5 years  Mammogram status: Completed 08/15/2021. Repeat every year  Bone Density status: Completed 08/25/2018.   Lung Cancer Screening: (Low Dose CT Chest recommended if Age 43107-80 years, 30 pack-year currently smoking OR have quit w/in 15years.) does not qualify.   Lung Cancer Screening Referral: no  Additional Screening:  Hepatitis C Screening: does qualify;   Vision Screening: Recommended annual ophthalmology exams for early detection of glaucoma and other disorders of the eye. Is the patient up to date with their annual eye exam?  Yes  Who is the provider or what is the name of the office in which the patient attends annual eye exams[facility] a provider, would they like to be referred to a provider to establish care? No .   Dental Screening: Recommended annual dental exams for proper oral hygiene  Community Resource Referral / Chronic Care Management: CRR required this visit?  No   CCM required this visit?  No      Plan:     I have personally reviewed and noted the following in the patient's chart:   Medical and social history Use of alcohol, tobacco or illicit drugs  Current medications and supplements including opioid prescriptions. Patient is not currently taking opioid prescriptions. Functional ability and status Nutritional status Physical activity Advanced  directives List of other physicians Hospitalizations, surgeries, and ER visits in previous 12 months Vitals Screenings to include cognitive, depression, and falls Referrals and appointments  In addition, I have reviewed and discussed with patient certain preventive protocols, quality metrics, and best practice recommendations. A written personalized care plan for preventive services as well as general preventive health recommendations were provided to patient.     Kellie Simmering, LPN   01/25/6815   Nurse Notes: none  Due to this being a virtual visit, the after visit summary with patients personalized plan was offered to patient via mail or my-chart.  Patient would like to access on my-chart

## 2022-01-05 NOTE — Patient Instructions (Signed)
Kimberly Schroeder , Thank you for taking time to come for your Medicare Wellness Visit. I appreciate your ongoing commitment to your health goals. Please review the following plan we discussed and let me know if I can assist you in the future.   Screening recommendations/referrals: Colonoscopy: completed 07/08/2016, due now Mammogram: completed 08/15/2021, due 08/17/2022 Bone Density: completed 08/25/2018 Recommended yearly ophthalmology/optometry visit for glaucoma screening and checkup Recommended yearly dental visit for hygiene and checkup  Vaccinations: Influenza vaccine: due Pneumococcal vaccine: completed 10/12/2019 Tdap vaccine: completed 03/29/2017, due 03/30/2027 Shingles vaccine: completed   Covid-19: 06/07/2019, 06/28/2019, 04/16/2020  Advanced directives: Please bring a copy of your POA (Power of Attorney) and/or Living Will to your next appointment.   Conditions/risks identified: none  Next appointment: Follow up in one year for your annual wellness visit    Preventive Care 65 Years and Older, Female Preventive care refers to lifestyle choices and visits with your health care provider that can promote health and wellness. What does preventive care include? A yearly physical exam. This is also called an annual well check. Dental exams once or twice a year. Routine eye exams. Ask your health care provider how often you should have your eyes checked. Personal lifestyle choices, including: Daily care of your teeth and gums. Regular physical activity. Eating a healthy diet. Avoiding tobacco and drug use. Limiting alcohol use. Practicing safe sex. Taking low-dose aspirin every day. Taking vitamin and mineral supplements as recommended by your health care provider. What happens during an annual well check? The services and screenings done by your health care provider during your annual well check will depend on your age, overall health, lifestyle risk factors, and family history of  disease. Counseling  Your health care provider may ask you questions about your: Alcohol use. Tobacco use. Drug use. Emotional well-being. Home and relationship well-being. Sexual activity. Eating habits. History of falls. Memory and ability to understand (cognition). Work and work Statistician. Reproductive health. Screening  You may have the following tests or measurements: Height, weight, and BMI. Blood pressure. Lipid and cholesterol levels. These may be checked every 5 years, or more frequently if you are over 5 years old. Skin check. Lung cancer screening. You may have this screening every year starting at age 54 if you have a 30-pack-year history of smoking and currently smoke or have quit within the past 15 years. Fecal occult blood test (FOBT) of the stool. You may have this test every year starting at age 22. Flexible sigmoidoscopy or colonoscopy. You may have a sigmoidoscopy every 5 years or a colonoscopy every 10 years starting at age 42. Hepatitis C blood test. Hepatitis B blood test. Sexually transmitted disease (STD) testing. Diabetes screening. This is done by checking your blood sugar (glucose) after you have not eaten for a while (fasting). You may have this done every 1-3 years. Bone density scan. This is done to screen for osteoporosis. You may have this done starting at age 70. Mammogram. This may be done every 1-2 years. Talk to your health care provider about how often you should have regular mammograms. Talk with your health care provider about your test results, treatment options, and if necessary, the need for more tests. Vaccines  Your health care provider may recommend certain vaccines, such as: Influenza vaccine. This is recommended every year. Tetanus, diphtheria, and acellular pertussis (Tdap, Td) vaccine. You may need a Td booster every 10 years. Zoster vaccine. You may need this after age 42. Pneumococcal 13-valent conjugate (PCV13)  vaccine. One  dose is recommended after age 34. Pneumococcal polysaccharide (PPSV23) vaccine. One dose is recommended after age 43. Talk to your health care provider about which screenings and vaccines you need and how often you need them. This information is not intended to replace advice given to you by your health care provider. Make sure you discuss any questions you have with your health care provider. Document Released: 05/30/2015 Document Revised: 01/21/2016 Document Reviewed: 03/04/2015 Elsevier Interactive Patient Education  2017 [place] Prevention in the Home Falls can cause injuries. They can happen to people of all ages. There are many things you can do to make your home safe and to help prevent falls. What can I do on the outside of my home? Regularly fix the edges of walkways and driveways and fix any cracks. Remove anything that might make you trip as you walk through a door, such as a raised step or threshold. Trim any bushes or trees on the path to your home. Use bright outdoor lighting. Clear any walking paths of anything that might make someone trip, such as rocks or tools. Regularly check to see if handrails are loose or broken. Make sure that both sides of any steps have handrails. Any raised decks and porches should have guardrails on the edges. Have any leaves, snow, or ice cleared regularly. Use sand or salt on walking paths during winter. Clean up any spills in your garage right away. This includes oil or grease spills. What can I do in the bathroom? Use night lights. Install grab bars by the toilet and in the tub and shower. Do not use towel bars as grab bars. Use non-skid mats or decals in the tub or shower. If you need to sit down in the shower, use a plastic, non-slip stool. Keep the floor dry. Clean up any water that spills on the floor as soon as it happens. Remove soap buildup in the tub or shower regularly. Attach bath mats securely with double-sided  non-slip rug tape. Do not have throw rugs and other things on the floor that can make you trip. What can I do in the bedroom? Use night lights. Make sure that you have a light by your bed that is easy to reach. Do not use any sheets or blankets that are too big for your bed. They should not hang down onto the floor. Have a firm chair that has side arms. You can use this for support while you get dressed. Do not have throw rugs and other things on the floor that can make you trip. What can I do in the kitchen? Clean up any spills right away. Avoid walking on wet floors. Keep items that you use a lot in easy-to-reach places. If you need to reach something above you, use a strong step stool that has a grab bar. Keep electrical cords out of the way. Do not use floor polish or wax that makes floors slippery. If you must use wax, use non-skid floor wax. Do not have throw rugs and other things on the floor that can make you trip. What can I do with my stairs? Do not leave any items on the stairs. Make sure that there are handrails on both sides of the stairs and use them. Fix handrails that are broken or loose. Make sure that handrails are as long as the stairways. Check any carpeting to make sure that it is firmly attached to the stairs. Fix any carpet that is loose  or worn. Avoid having throw rugs at the top or bottom of the stairs. If you do have throw rugs, attach them to the floor with carpet tape. Make sure that you have a light switch at the top of the stairs and the bottom of the stairs. If you do not have them, ask someone to add them for you. What else can I do to help prevent falls? Wear shoes that: Do not have high heels. Have rubber bottoms. Are comfortable and fit you well. Are closed at the toe. Do not wear sandals. If you use a stepladder: Make sure that it is fully opened. Do not climb a closed stepladder. Make sure that both sides of the stepladder are locked into place. Ask  someone to hold it for you, if possible. Clearly mark and make sure that you can see: Any grab bars or handrails. First and last steps. Where the edge of each step is. Use tools that help you move around (mobility aids) if they are needed. These include: Canes. Walkers. Scooters. Crutches. Turn on the lights when you go into a dark area. Replace any light bulbs as soon as they burn out. Set up your furniture so you have a clear path. Avoid moving your furniture around. If any of your floors are uneven, fix them. If there are any pets around you, be aware of where they are. Review your medicines with your doctor. Some medicines can make you feel dizzy. This can increase your chance of falling. Ask your doctor what other things that you can do to help prevent falls. This information is not intended to replace advice given to you by your health care provider. Make sure you discuss any questions you have with your health care provider. Document Released: 02/27/2009 Document Revised: 10/09/2015 Document Reviewed: 06/07/2014 Elsevier Interactive Patient Education  2017 [employer].

## 2022-01-19 DIAGNOSIS — J3081 Allergic rhinitis due to animal (cat) (dog) hair and dander: Secondary | ICD-10-CM | POA: Diagnosis not present

## 2022-01-19 DIAGNOSIS — J3089 Other allergic rhinitis: Secondary | ICD-10-CM | POA: Diagnosis not present

## 2022-01-19 DIAGNOSIS — J301 Allergic rhinitis due to pollen: Secondary | ICD-10-CM | POA: Diagnosis not present

## 2022-01-20 ENCOUNTER — Other Ambulatory Visit: Payer: Self-pay | Admitting: Internal Medicine

## 2022-02-04 DIAGNOSIS — J3081 Allergic rhinitis due to animal (cat) (dog) hair and dander: Secondary | ICD-10-CM | POA: Diagnosis not present

## 2022-02-04 DIAGNOSIS — J301 Allergic rhinitis due to pollen: Secondary | ICD-10-CM | POA: Diagnosis not present

## 2022-02-04 DIAGNOSIS — J3089 Other allergic rhinitis: Secondary | ICD-10-CM | POA: Diagnosis not present

## 2022-02-11 ENCOUNTER — Ambulatory Visit (INDEPENDENT_AMBULATORY_CARE_PROVIDER_SITE_OTHER): Payer: Medicare HMO | Admitting: *Deleted

## 2022-02-11 DIAGNOSIS — Z23 Encounter for immunization: Secondary | ICD-10-CM

## 2022-02-16 DIAGNOSIS — H40013 Open angle with borderline findings, low risk, bilateral: Secondary | ICD-10-CM | POA: Diagnosis not present

## 2022-02-16 DIAGNOSIS — H524 Presbyopia: Secondary | ICD-10-CM | POA: Diagnosis not present

## 2022-02-16 DIAGNOSIS — Z0100 Encounter for examination of eyes and vision without abnormal findings: Secondary | ICD-10-CM | POA: Diagnosis not present

## 2022-02-16 DIAGNOSIS — H5203 Hypermetropia, bilateral: Secondary | ICD-10-CM | POA: Diagnosis not present

## 2022-02-16 DIAGNOSIS — H52221 Regular astigmatism, right eye: Secondary | ICD-10-CM | POA: Diagnosis not present

## 2022-02-17 DIAGNOSIS — J301 Allergic rhinitis due to pollen: Secondary | ICD-10-CM | POA: Diagnosis not present

## 2022-02-17 DIAGNOSIS — J3081 Allergic rhinitis due to animal (cat) (dog) hair and dander: Secondary | ICD-10-CM | POA: Diagnosis not present

## 2022-02-17 DIAGNOSIS — J3089 Other allergic rhinitis: Secondary | ICD-10-CM | POA: Diagnosis not present

## 2022-03-08 DIAGNOSIS — J3081 Allergic rhinitis due to animal (cat) (dog) hair and dander: Secondary | ICD-10-CM | POA: Diagnosis not present

## 2022-03-08 DIAGNOSIS — J3089 Other allergic rhinitis: Secondary | ICD-10-CM | POA: Diagnosis not present

## 2022-03-08 DIAGNOSIS — J301 Allergic rhinitis due to pollen: Secondary | ICD-10-CM | POA: Diagnosis not present

## 2022-03-22 DIAGNOSIS — J3081 Allergic rhinitis due to animal (cat) (dog) hair and dander: Secondary | ICD-10-CM | POA: Diagnosis not present

## 2022-03-22 DIAGNOSIS — J3089 Other allergic rhinitis: Secondary | ICD-10-CM | POA: Diagnosis not present

## 2022-03-22 DIAGNOSIS — J301 Allergic rhinitis due to pollen: Secondary | ICD-10-CM | POA: Diagnosis not present

## 2022-04-01 ENCOUNTER — Other Ambulatory Visit: Payer: Self-pay | Admitting: Gastroenterology

## 2022-04-01 NOTE — Telephone Encounter (Signed)
Please advise 

## 2022-04-05 DIAGNOSIS — J301 Allergic rhinitis due to pollen: Secondary | ICD-10-CM | POA: Diagnosis not present

## 2022-04-05 DIAGNOSIS — J3081 Allergic rhinitis due to animal (cat) (dog) hair and dander: Secondary | ICD-10-CM | POA: Diagnosis not present

## 2022-04-05 DIAGNOSIS — J3089 Other allergic rhinitis: Secondary | ICD-10-CM | POA: Diagnosis not present

## 2022-04-15 DIAGNOSIS — Z0184 Encounter for antibody response examination: Secondary | ICD-10-CM | POA: Diagnosis not present

## 2022-04-19 DIAGNOSIS — J3081 Allergic rhinitis due to animal (cat) (dog) hair and dander: Secondary | ICD-10-CM | POA: Diagnosis not present

## 2022-04-19 DIAGNOSIS — J301 Allergic rhinitis due to pollen: Secondary | ICD-10-CM | POA: Diagnosis not present

## 2022-04-19 DIAGNOSIS — J3089 Other allergic rhinitis: Secondary | ICD-10-CM | POA: Diagnosis not present

## 2022-05-12 ENCOUNTER — Telehealth: Payer: Self-pay | Admitting: Gastroenterology

## 2022-05-12 DIAGNOSIS — J301 Allergic rhinitis due to pollen: Secondary | ICD-10-CM | POA: Diagnosis not present

## 2022-05-12 DIAGNOSIS — J3081 Allergic rhinitis due to animal (cat) (dog) hair and dander: Secondary | ICD-10-CM | POA: Diagnosis not present

## 2022-05-12 DIAGNOSIS — J3089 Other allergic rhinitis: Secondary | ICD-10-CM | POA: Diagnosis not present

## 2022-05-12 NOTE — Telephone Encounter (Signed)
Incoming call from patient states she is returning a call from DD. Please advise

## 2022-05-18 DIAGNOSIS — J3081 Allergic rhinitis due to animal (cat) (dog) hair and dander: Secondary | ICD-10-CM | POA: Diagnosis not present

## 2022-05-18 DIAGNOSIS — J3089 Other allergic rhinitis: Secondary | ICD-10-CM | POA: Diagnosis not present

## 2022-05-18 DIAGNOSIS — J301 Allergic rhinitis due to pollen: Secondary | ICD-10-CM | POA: Diagnosis not present

## 2022-06-01 DIAGNOSIS — J3081 Allergic rhinitis due to animal (cat) (dog) hair and dander: Secondary | ICD-10-CM | POA: Diagnosis not present

## 2022-06-01 DIAGNOSIS — J3089 Other allergic rhinitis: Secondary | ICD-10-CM | POA: Diagnosis not present

## 2022-06-01 DIAGNOSIS — J301 Allergic rhinitis due to pollen: Secondary | ICD-10-CM | POA: Diagnosis not present

## 2022-06-08 ENCOUNTER — Ambulatory Visit: Payer: Medicare HMO | Admitting: Gastroenterology

## 2022-06-08 ENCOUNTER — Encounter: Payer: Self-pay | Admitting: Gastroenterology

## 2022-06-08 VITALS — BP 128/90 | HR 76 | Ht 62.0 in | Wt 153.2 lb

## 2022-06-08 DIAGNOSIS — Z1211 Encounter for screening for malignant neoplasm of colon: Secondary | ICD-10-CM

## 2022-06-08 DIAGNOSIS — Z800 Family history of malignant neoplasm of digestive organs: Secondary | ICD-10-CM | POA: Insufficient documentation

## 2022-06-08 MED ORDER — NA SULFATE-K SULFATE-MG SULF 17.5-3.13-1.6 GM/177ML PO SOLN: 1.0000 | Freq: Once | ORAL | 0 refills | Status: AC

## 2022-06-08 NOTE — Progress Notes (Signed)
06/08/2022 Kimberly Schroeder [id number] 05/10/1951   HISTORY OF PRESENT ILLNESS:  This is a 72 year old female who is a patient of Dr. Woodward Ku.  She follows here for her colonoscopies.  Her last was in 06/2016 at which time she was found to have diverticulosis and internal hemorrhoids.  She has family history of colon cancer in her father and a paternal uncle.  She is here today to discuss repeat colonoscopy.  No complaints today.  Says that she moves her bowels well without any issues.  No rectal bleeding.   Past Medical History:  Diagnosis Date   Allergic rhinitis    allergist evaluation spring fall and dust.   Ectopic pregnancy    x 2   GERD (gastroesophageal reflux disease)    Hematuria, microscopic    eval 2001  Dr Terance Hart   Past Surgical History:  Procedure Laterality Date   LAPAROSCOPY     ectopic pregnancy   LAPAROSCOPY FOR ECTOPIC PREGNANCY     x2    reports that she has never smoked. She has never used smokeless tobacco. She reports current alcohol use. She reports that she does not use drugs. family history includes Arthritis in an other family member; Colon cancer in her paternal uncle; Colon cancer (age of onset: 56) in her father; Hypertension in an other family member; Parkinsonism in an other family member. Allergies  Allergen Reactions   Iohexol Hives    IVP DYE      Outpatient Encounter Medications as of 06/08/2022  Medication Sig   amLODipine (NORVASC) 2.5 MG tablet TAKE 1 TABLET BY MOUTH EVERY DAY FOR BLOOD PRESSURE   cetirizine (ZYRTEC) 10 MG tablet Take 10 mg by mouth daily.   Cholecalciferol (VITAMIN D3 MAXIMUM STRENGTH) 125 MCG (5000 UT) capsule Take 5,000 Units by mouth in the morning and at bedtime. With vitamin K and minerals   EPIPEN 2-PAK 0.3 MG/0.3ML SOAJ injection    Estradiol 10 MCG TABS vaginal tablet Place 1 each vaginally once a week.   fish oil-omega-3 fatty acids 1000 MG capsule Take 2 g by mouth daily.   METAMUCIL MULTIHEALTH  FIBER 63 % POWD Take by mouth daily in the afternoon.   omeprazole (PRILOSEC) 20 MG capsule TAKE 1 CAPSULE BY MOUTH TWICE A DAY BEFORE A MEAL (Patient taking differently: Take 20 mg by mouth daily. TAKE 1 CAPSULE BY MOUTH TWICE A DAY BEFORE A MEAL)   rosuvastatin (CRESTOR) 5 MG tablet Take 1 tablet (5 mg total) by mouth daily.   [DISCONTINUED] fluticasone (FLONASE) 50 MCG/ACT nasal spray INSTILL 1 SPRAY INTO BOTH NOSTRILS DAILY   No facility-administered encounter medications on file as of 06/08/2022.    REVIEW OF SYSTEMS  : All other systems reviewed and negative except where noted in the History of Present Illness.   PHYSICAL EXAM: BP (!) 128/90   Pulse 76   Ht '5\' 2"'$  (1.575 m)   Wt 153 lb 3.2 oz (69.5 kg)   BMI 28.02 kg/m  General: Well developed white female in no acute distress Head: Normocephalic and atraumatic Eyes:  Sclerae anicteric, conjunctiva pink. Ears: Normal auditory acuity Lungs: Clear throughout to auscultation; no W/R/R. Heart: Regular rate and rhythm; no M/R/G. Abdomen: Soft, non-distended.  BS present.  Non-tender. Rectal:  Will be done at the time of colonoscopy. Musculoskeletal: Symmetrical with no gross deformities  Skin: No lesions on visible extremities Extremities: No edema  Neurological: Alert oriented x 4, grossly non-focal Psychological:  Alert and  cooperative. Normal mood and affect  ASSESSMENT AND PLAN: *FH of colon cancer in her father and a paternal uncle:  Her last colonoscopy was in 06/2016.  Will schedule for colonoscopy with Dr. Silverio Decamp.  The risks, benefits, and alternatives to colonoscopy were discussed with the patient and she consents to proceed.   CC:  Panosh, Standley Brooking, MD

## 2022-06-08 NOTE — Patient Instructions (Signed)
You have been scheduled for a colonoscopy. Please follow written instructions given to you at your visit today.  Please pick up your prep supplies at the pharmacy within the next 1-3 days. If you use inhalers (even only as needed), please bring them with you on the day of your procedure.  _______________________________________________________  If your blood pressure at your visit was 140/90 or greater, please contact your primary care physician to follow up on this.  _______________________________________________________  If you are age 71 or older, your body mass index should be between 23-30. Your Body mass index is 28.02 kg/m. If this is out of the aforementioned range listed, please consider follow up with your Primary Care Provider.  If you are age 38 or younger, your body mass index should be between 19-25. Your Body mass index is 28.02 kg/m. If this is out of the aformentioned range listed, please consider follow up with your Primary Care Provider.   ________________________________________________________  The [facility] providers would like to encourage you to use [facility] to communicate with providers for non-urgent requests or questions.  Due to long hold times on the telephone, sending your provider a message by [facility] may be a faster and more efficient way to get a response.  Please allow 48 business hours for a response.  Please remember that this is for non-urgent requests.  _______________________________________________________

## 2022-06-10 DIAGNOSIS — J301 Allergic rhinitis due to pollen: Secondary | ICD-10-CM | POA: Diagnosis not present

## 2022-06-10 DIAGNOSIS — J3081 Allergic rhinitis due to animal (cat) (dog) hair and dander: Secondary | ICD-10-CM | POA: Diagnosis not present

## 2022-06-10 DIAGNOSIS — J3089 Other allergic rhinitis: Secondary | ICD-10-CM | POA: Diagnosis not present

## 2022-06-15 ENCOUNTER — Ambulatory Visit (AMBULATORY_SURGERY_CENTER): Payer: Medicare HMO | Admitting: Gastroenterology

## 2022-06-15 ENCOUNTER — Encounter: Payer: Self-pay | Admitting: Gastroenterology

## 2022-06-15 VITALS — BP 128/71 | HR 63 | Temp 97.3°F | Resp 13 | Ht 62.0 in | Wt 153.0 lb

## 2022-06-15 DIAGNOSIS — Z800 Family history of malignant neoplasm of digestive organs: Secondary | ICD-10-CM

## 2022-06-15 DIAGNOSIS — D122 Benign neoplasm of ascending colon: Secondary | ICD-10-CM | POA: Diagnosis not present

## 2022-06-15 DIAGNOSIS — Z1211 Encounter for screening for malignant neoplasm of colon: Secondary | ICD-10-CM | POA: Diagnosis not present

## 2022-06-15 MED ORDER — SODIUM CHLORIDE 0.9 % IV SOLN: 500.0000 mL | Freq: Once | INTRAVENOUS | Status: DC

## 2022-06-15 NOTE — Progress Notes (Unsigned)
A and O x3. Report to RN. Tolerated MAC anesthesia well. 

## 2022-06-15 NOTE — Progress Notes (Unsigned)
[facility] History and Physical   Primary Care Physician:  Burnis Medin, MD   Reason for Procedure:  Family history of colon cancer  Plan:    Screening colonoscopy with possible interventions as needed     HPI: Kimberly Schroeder is a very pleasant 72 y.o. female here for screening colonoscopy. Denies any nausea, vomiting, abdominal pain, melena or bright red blood per rectum  The risks and benefits as well as alternatives of endoscopic procedure(s) have been discussed and reviewed. All questions answered. The patient agrees to proceed.    Past Medical History:  Diagnosis Date   Allergic rhinitis    allergist evaluation spring fall and dust.   Ectopic pregnancy    x 2   GERD (gastroesophageal reflux disease)    Hematuria, microscopic    eval 2001  Dr Terance Hart    Past Surgical History:  Procedure Laterality Date   LAPAROSCOPY     ectopic pregnancy   LAPAROSCOPY FOR ECTOPIC PREGNANCY     x2    Prior to Admission medications   Medication Sig Start Date End Date Taking? Authorizing Provider  amLODipine (NORVASC) 2.5 MG tablet TAKE 1 TABLET BY MOUTH EVERY DAY FOR BLOOD PRESSURE 01/20/22  Yes Panosh, Standley Brooking, MD  cetirizine (ZYRTEC) 10 MG tablet Take 10 mg by mouth daily.   Yes [provider]  Cholecalciferol (VITAMIN D3 MAXIMUM STRENGTH) 125 MCG (5000 UT) capsule Take 5,000 Units by mouth in the morning and at bedtime. With vitamin K and minerals   Yes [provider]  Estradiol 10 MCG TABS vaginal tablet Place 1 each vaginally once a week.   Yes [provider]  fish oil-omega-3 fatty acids 1000 MG capsule Take 2 g by mouth daily.   Yes [provider]  omeprazole (PRILOSEC) 20 MG capsule TAKE 1 CAPSULE BY MOUTH TWICE A DAY BEFORE A MEAL Patient taking differently: Take 20 mg by mouth daily. TAKE 1 CAPSULE BY MOUTH TWICE A DAY BEFORE A MEAL 04/05/22  Yes Zehr, Janett Billow D, PA-C  rosuvastatin (CRESTOR) 5 MG tablet Take 1  tablet (5 mg total) by mouth daily. 08/12/21  Yes Elouise Munroe, MD  EPIPEN 2-PAK 0.3 MG/0.3ML SOAJ injection  05/31/16   [provider]  METAMUCIL MULTIHEALTH FIBER 63 % POWD Take by mouth daily in the afternoon. 06/03/21   [provider]    Current Outpatient Medications  Medication Sig Dispense Refill   amLODipine (NORVASC) 2.5 MG tablet TAKE 1 TABLET BY MOUTH EVERY DAY FOR BLOOD PRESSURE 90 tablet 3   cetirizine (ZYRTEC) 10 MG tablet Take 10 mg by mouth daily.     Cholecalciferol (VITAMIN D3 MAXIMUM STRENGTH) 125 MCG (5000 UT) capsule Take 5,000 Units by mouth in the morning and at bedtime. With vitamin K and minerals     Estradiol 10 MCG TABS vaginal tablet Place 1 each vaginally once a week.     fish oil-omega-3 fatty acids 1000 MG capsule Take 2 g by mouth daily.     omeprazole (PRILOSEC) 20 MG capsule TAKE 1 CAPSULE BY MOUTH TWICE A DAY BEFORE A MEAL (Patient taking differently: Take 20 mg by mouth daily. TAKE 1 CAPSULE BY MOUTH TWICE A DAY BEFORE A MEAL) 180 capsule 0   rosuvastatin (CRESTOR) 5 MG tablet Take 1 tablet (5 mg total) by mouth daily. 90 tablet 3   EPIPEN 2-PAK 0.3 MG/0.3ML SOAJ injection   1   METAMUCIL MULTIHEALTH FIBER 63 % POWD Take by mouth  daily in the afternoon.     Current Facility-Administered Medications  Medication Dose Route Frequency Provider Last Rate Last Admin   0.9 %  sodium chloride infusion  500 mL Intravenous Once Mauri Pole, MD        Allergies as of 06/15/2022 - Review Complete 06/15/2022  Allergen Reaction Noted   Iohexol Hives 03/31/2011    Family History  Problem Relation Age of Onset   Colon cancer Father 57   Colon cancer Paternal Uncle        58's   Parkinsonism Other    Arthritis Other    Hypertension Other    Pancreatic cancer Neg Hx    Prostate cancer Neg Hx    Rectal cancer Neg Hx    Stomach cancer Neg Hx    Esophageal cancer Neg Hx     Social History   Socioeconomic History   Marital  status: Married    Spouse name: Not on file   Number of children: 0   Years of education: Not on file   Highest education level: Not on file  Occupational History   Occupation: retired  Tobacco Use   Smoking status: Never   Smokeless tobacco: Never  Vaping Use   Vaping Use: Never used  Substance and Sexual Activity   Alcohol use: Yes    Comment: occasional wine   Drug use: No   Sexual activity: Not on file  Other Topics Concern   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    Social Determinants of Health   Financial Resource Strain: Low Risk  (01/05/2022)   Overall Financial Resource Strain (CARDIA)    Difficulty of Paying Living Expenses: Not hard at all  Food Insecurity: No Food Insecurity (01/05/2022)   Hunger Vital Sign    Worried About Running Out of Food in the Last Year: Never true    Ran Out of Food in the Last Year: Never true  Transportation Needs: No Transportation Needs (01/05/2022)   PRAPARE - Hydrologist (Medical): No    Lack of Transportation (Non-Medical): No  Physical Activity: Sufficiently Active (01/05/2022)   Exercise Vital Sign    Days of Exercise per Week: 6 days    Minutes of Exercise per Session: 60 min  Stress: No Stress Concern Present (01/05/2022)   [place]    Feeling of Stress : Not at all  Social Connections: [place] (11/04/2020)   Social Connection and Isolation Panel [NHANES]    Frequency of Communication with Friends and Family: Twice a week    Frequency of Social Gatherings with Friends and Family: Twice a week    Attends Religious Services: 1 to 4 times per year    Active Member of  [employer] or Organizations: Yes    Attends Archivist Meetings: 1 to 4 times per year    Marital Status: Married  Human resources officer Violence: Not At Risk (11/04/2020)   Humiliation, Afraid, Rape, and Kick questionnaire    Fear of Current or Ex-Partner: No    Emotionally Abused: No    Physically Abused:  No    Sexually Abused: No    Review of Systems:  All other review of systems negative except as mentioned in the HPI.  Physical Exam: Vital signs in last 24 hours: Blood Pressure 133/71   Pulse 71   Temperature (Abnormal) 97.3 F (36.3 C) (Temporal)   Respiration (Abnormal) 7   Height '5\' 2"'$  (1.575 m)   Weight 153 lb (69.4 kg)   Oxygen Saturation 100%   Body Mass Index 27.98 kg/m  General:   Alert, NAD Lungs:  Clear .   Heart:  Regular rate and rhythm Abdomen:  Soft, nontender and nondistended. Neuro/Psych:  Alert and cooperative. Normal mood and affect. A and O x 3  Reviewed labs, radiology imaging, old records and pertinent past GI work up  Patient is appropriate for planned procedure(s) and anesthesia in an ambulatory setting   K. Denzil Magnuson , MD [phone]

## 2022-06-15 NOTE — Progress Notes (Unsigned)
Pt's states no medical or surgical changes since previsit or office visit. 

## 2022-06-15 NOTE — Patient Instructions (Addendum)
   Handout on polyps ,diverticulosis,& hemorrhoids given to you today  Await pathology results on polyp removed    YOU HAD AN ENDOSCOPIC PROCEDURE TODAY AT [facility]:   Refer to the procedure report that was given to you for any specific questions about what was found during the examination.  If the procedure report does not answer your questions, please call your gastroenterologist to clarify.  If you requested that your care partner not be given the details of your procedure findings, then the procedure report has been included in a sealed envelope for you to review at your convenience later.  YOU SHOULD EXPECT: Some feelings of bloating in the abdomen. Passage of more gas than usual.  Walking can help get rid of the air that was put into your GI tract during the procedure and reduce the bloating. If you had a lower endoscopy (such as a colonoscopy or flexible sigmoidoscopy) you may notice spotting of blood in your stool or on the toilet paper. If you underwent a bowel prep for your procedure, you may not have a normal bowel movement for a few days.  Please Note:  You might notice some irritation and congestion in your nose or some drainage.  This is from the oxygen used during your procedure.  There is no need for concern and it should clear up in a day or so.  SYMPTOMS TO REPORT IMMEDIATELY:  Following lower endoscopy (colonoscopy or flexible sigmoidoscopy):  Excessive amounts of blood in the stool  Significant tenderness or worsening of abdominal pains  Swelling of the abdomen that is new, acute  Fever of 100F or higher   For urgent or emergent issues, a gastroenterologist can be reached at any hour by calling [phone]. Do not use MyChart messaging for urgent concerns.    DIET:  We do recommend a small meal at first, but then you may proceed to your regular diet.  Drink plenty of fluids but you should avoid alcoholic beverages for 24 hours.  ACTIVITY:  You  should plan to take it easy for the rest of today and you should NOT DRIVE or use heavy machinery until tomorrow (because of the sedation medicines used during the test).    FOLLOW UP: Our staff will call the number listed on your records the next business day following your procedure.  We will call around 7:15- 8:00 am to check on you and address any questions or concerns that you may have regarding the information given to you following your procedure. If we do not reach you, we will leave a message.     If any biopsies were taken you will be contacted by phone or by letter within the next 1-3 weeks.  Please call us at [phone] if you have not heard about the biopsies in 3 weeks.    SIGNATURES/CONFIDENTIALITY: You and/or your care partner have signed paperwork which will be entered into your electronic medical record.  These signatures attest to the fact that that the information above on your After Visit Summary has been reviewed and is understood.  Full responsibility of the confidentiality of this discharge information lies with you and/or your care-partner.

## 2022-06-15 NOTE — Op Note (Signed)
[facility] Patient Name: Kimberly Schroeder Procedure Date: 06/15/2022 8:01 AM MRN: [id number] Endoscopist: Mauri Pole , MD, [id number] Age: 72 Referring MD:  Date of Birth: [birth year] Gender: Female Account #: [id number] Procedure:                Colonoscopy Indications:              Screening in patient at increased risk: Family                            history of 1st-degree relative with colorectal                            cancer Medicines:                Monitored Anesthesia Care Procedure:                Pre-Anesthesia Assessment:                           - Prior to the procedure, a History and Physical                            was performed, and patient medications and                            allergies were reviewed. The patient's tolerance of                            previous anesthesia was also reviewed. The risks                            and benefits of the procedure and the sedation                            options and risks were discussed with the patient.                            All questions were answered, and informed consent                            was obtained. Prior Anticoagulants: The patient has                            taken no anticoagulant or antiplatelet agents. ASA                            Grade Assessment: II - A patient with mild systemic                            disease. After reviewing the risks and benefits,                            the patient was deemed in satisfactory condition to  undergo the procedure.                           After obtaining informed consent, the colonoscope                            was passed under direct vision. Throughout the                            procedure, the patient's blood pressure, pulse, and                            oxygen saturations were monitored continuously. The                            Olympus PCF-H190DL (#[id number]) Colonoscope was                             introduced through the anus and advanced to the the                            cecum, identified by appendiceal orifice and                            ileocecal valve. The colonoscopy was performed                            without difficulty. The patient tolerated the                            procedure well. The quality of the bowel                            preparation was excellent. The ileocecal valve,                            appendiceal orifice, and rectum were photographed. Scope In: 8:13:33 AM Scope Out: 8:31:02 AM Scope Withdrawal Time: 0 hours 15 minutes 10 seconds  Total Procedure Duration: 0 hours 17 minutes 29 seconds  Findings:                 The perianal and digital rectal examinations were                            normal.                           A 5 mm polyp was found in the ascending colon. The                            polyp was sessile. The polyp was removed with a                            cold snare. Resection and retrieval were complete.  Scattered large-mouthed, medium-mouthed and                            small-mouthed diverticula were found in the sigmoid                            colon and descending colon.                           Non-bleeding external and internal hemorrhoids were                            found during retroflexion. The hemorrhoids were                            small. Complications:            No immediate complications. Estimated Blood Loss:     Estimated blood loss was minimal. Impression:               - One 5 mm polyp in the ascending colon, removed                            with a cold snare. Resected and retrieved.                           - Diverticulosis in the sigmoid colon and in the                            descending colon.                           - Non-bleeding external and internal hemorrhoids. Recommendation:           - Patient has a contact number  available for                            emergencies. The signs and symptoms of potential                            delayed complications were discussed with the                            patient. Return to normal activities tomorrow.                            Written discharge instructions were provided to the                            patient.                           - Resume previous diet.                           - Continue present medications.                           -  Await pathology results.                           - Repeat colonoscopy in 5 years for surveillance. Mauri Pole, MD 06/15/2022 8:41:16 AM This report has been signed electronically.

## 2022-06-15 NOTE — Progress Notes (Unsigned)
Called to room to assist during endoscopic procedure.  Patient ID and intended procedure confirmed with present staff. Received instructions for my participation in the procedure from the performing physician.  

## 2022-06-16 ENCOUNTER — Telehealth: Payer: Self-pay | Admitting: *Deleted

## 2022-06-16 NOTE — Telephone Encounter (Signed)
  Follow up Call-     06/15/2022    7:11 AM 04/25/2020    3:07 PM  Call back number  Post procedure Call Back phone  # [phone]  Permission to leave phone message Yes Yes     Patient questions:  Do you have a fever, pain , or abdominal swelling? No. Pain Score  0 *  Have you tolerated food without any problems? Yes.    Have you been able to return to your normal activities? Yes.    Do you have any questions about your discharge instructions: Diet   No. Medications  No. Follow up visit  No.  Do you have questions or concerns about your Care? No.  Actions: * If pain score is 4 or above: No action needed, pain <4.

## 2022-06-17 DIAGNOSIS — J3089 Other allergic rhinitis: Secondary | ICD-10-CM | POA: Diagnosis not present

## 2022-06-17 DIAGNOSIS — J301 Allergic rhinitis due to pollen: Secondary | ICD-10-CM | POA: Diagnosis not present

## 2022-06-17 DIAGNOSIS — J3081 Allergic rhinitis due to animal (cat) (dog) hair and dander: Secondary | ICD-10-CM | POA: Diagnosis not present

## 2022-06-28 DIAGNOSIS — J3089 Other allergic rhinitis: Secondary | ICD-10-CM | POA: Diagnosis not present

## 2022-06-28 DIAGNOSIS — J301 Allergic rhinitis due to pollen: Secondary | ICD-10-CM | POA: Diagnosis not present

## 2022-06-28 DIAGNOSIS — J3081 Allergic rhinitis due to animal (cat) (dog) hair and dander: Secondary | ICD-10-CM | POA: Diagnosis not present

## 2022-07-01 ENCOUNTER — Encounter: Payer: Self-pay | Admitting: Gastroenterology

## 2022-07-02 ENCOUNTER — Other Ambulatory Visit: Payer: Self-pay | Admitting: Gastroenterology

## 2022-07-05 DIAGNOSIS — J3081 Allergic rhinitis due to animal (cat) (dog) hair and dander: Secondary | ICD-10-CM | POA: Diagnosis not present

## 2022-07-05 DIAGNOSIS — J301 Allergic rhinitis due to pollen: Secondary | ICD-10-CM | POA: Diagnosis not present

## 2022-07-05 DIAGNOSIS — J3089 Other allergic rhinitis: Secondary | ICD-10-CM | POA: Diagnosis not present

## 2022-07-12 DIAGNOSIS — J3089 Other allergic rhinitis: Secondary | ICD-10-CM | POA: Diagnosis not present

## 2022-07-12 DIAGNOSIS — J3081 Allergic rhinitis due to animal (cat) (dog) hair and dander: Secondary | ICD-10-CM | POA: Diagnosis not present

## 2022-07-12 DIAGNOSIS — J301 Allergic rhinitis due to pollen: Secondary | ICD-10-CM | POA: Diagnosis not present

## 2022-07-19 DIAGNOSIS — J301 Allergic rhinitis due to pollen: Secondary | ICD-10-CM | POA: Diagnosis not present

## 2022-07-19 DIAGNOSIS — J3089 Other allergic rhinitis: Secondary | ICD-10-CM | POA: Diagnosis not present

## 2022-07-19 DIAGNOSIS — J3081 Allergic rhinitis due to animal (cat) (dog) hair and dander: Secondary | ICD-10-CM | POA: Diagnosis not present

## 2022-07-21 DIAGNOSIS — D2271 Melanocytic nevi of right lower limb, including hip: Secondary | ICD-10-CM | POA: Diagnosis not present

## 2022-07-21 DIAGNOSIS — L821 Other seborrheic keratosis: Secondary | ICD-10-CM | POA: Diagnosis not present

## 2022-07-21 DIAGNOSIS — D224 Melanocytic nevi of scalp and neck: Secondary | ICD-10-CM | POA: Diagnosis not present

## 2022-07-21 DIAGNOSIS — D2272 Melanocytic nevi of left lower limb, including hip: Secondary | ICD-10-CM | POA: Diagnosis not present

## 2022-07-21 DIAGNOSIS — D2261 Melanocytic nevi of right upper limb, including shoulder: Secondary | ICD-10-CM | POA: Diagnosis not present

## 2022-07-21 DIAGNOSIS — D2262 Melanocytic nevi of left upper limb, including shoulder: Secondary | ICD-10-CM | POA: Diagnosis not present

## 2022-07-21 DIAGNOSIS — L918 Other hypertrophic disorders of the skin: Secondary | ICD-10-CM | POA: Diagnosis not present

## 2022-07-21 DIAGNOSIS — L820 Inflamed seborrheic keratosis: Secondary | ICD-10-CM | POA: Diagnosis not present

## 2022-08-02 DIAGNOSIS — J3089 Other allergic rhinitis: Secondary | ICD-10-CM | POA: Diagnosis not present

## 2022-08-02 DIAGNOSIS — J3081 Allergic rhinitis due to animal (cat) (dog) hair and dander: Secondary | ICD-10-CM | POA: Diagnosis not present

## 2022-08-02 DIAGNOSIS — J301 Allergic rhinitis due to pollen: Secondary | ICD-10-CM | POA: Diagnosis not present

## 2022-08-05 ENCOUNTER — Other Ambulatory Visit: Payer: Self-pay | Admitting: Internal Medicine

## 2022-08-16 DIAGNOSIS — J301 Allergic rhinitis due to pollen: Secondary | ICD-10-CM | POA: Diagnosis not present

## 2022-08-16 DIAGNOSIS — J3089 Other allergic rhinitis: Secondary | ICD-10-CM | POA: Diagnosis not present

## 2022-08-16 DIAGNOSIS — J3081 Allergic rhinitis due to animal (cat) (dog) hair and dander: Secondary | ICD-10-CM | POA: Diagnosis not present

## 2022-08-18 ENCOUNTER — Ambulatory Visit: Payer: Medicare HMO | Attending: Internal Medicine | Admitting: Internal Medicine

## 2022-08-18 ENCOUNTER — Encounter: Payer: Self-pay | Admitting: Internal Medicine

## 2022-08-18 VITALS — BP 132/78 | HR 68 | Ht 62.0 in | Wt 154.4 lb

## 2022-08-18 DIAGNOSIS — I10 Essential (primary) hypertension: Secondary | ICD-10-CM | POA: Diagnosis not present

## 2022-08-18 DIAGNOSIS — E7800 Pure hypercholesterolemia, unspecified: Secondary | ICD-10-CM | POA: Diagnosis not present

## 2022-08-18 DIAGNOSIS — R072 Precordial pain: Secondary | ICD-10-CM

## 2022-08-18 DIAGNOSIS — Z79899 Other long term (current) drug therapy: Secondary | ICD-10-CM | POA: Diagnosis not present

## 2022-08-18 DIAGNOSIS — Z7189 Other specified counseling: Secondary | ICD-10-CM

## 2022-08-18 NOTE — Patient Instructions (Signed)
Medication Instructions:   OKAY TO STOP YOUR STATIN MEDICATION FOR 1 MONTH- THEN RESUME AT EITHER 5mg  MONDAY, [place], [place]. OR CAN TAKE 2.5mg  EVERYDAY   PLEASE LET us KNOW HOW YOU ARE DOING IN 1 MONTH  *If you need a refill on your cardiac medications before your next appointment, please call your pharmacy*  Follow-Up: At [facility], you and your health needs are our priority.  As part of our continuing mission to provide you with exceptional heart care, we have created designated Provider Care Teams.  These Care Teams include your primary Cardiologist (physician) and Advanced Practice Providers (APPs -  Physician Assistants and Nurse Practitioners) who all work together to provide you with the care you need, when you need it.  Your next appointment:   1 year(s)  Provider:   Elouise Munroe, MD

## 2022-08-18 NOTE — Progress Notes (Signed)
Cardiology Office Note:    Date:  08/18/2022  ID:  Ravynn, Schweighardt [birth year], MRN [id number]  PCP:  Burnis Medin, MD  Cardiologist:  Elouise Munroe, MD  Electrophysiologist:  None   Referring MD: Burnis Medin, MD   Chief Complaint/Reason for Referral: Chest pain, elevated BP  History of Present Illness:    Kimberly Schroeder is a 72 y.o. female with a history of GERD, HLD who presents for follow up evaluation of chest pain and elevated BP.   We did a thorough evaluation all of which came back very reassuring. Normal CCTA and normal renal ultrasound. Echo grossly normal with borderline LVH and G1DD. We discussed that these findings on echo are probably secondary to elevated BP.   At her last visit she was tolerating amlodipine 2.5 mg daily with normal BP. She was feeling well overall with no specific concerns. Lipids 11/2020 had shown LDL 130, trig 79, HDL 62. Discussed in shared decision making; she was started on Crestor 5 mg daily. Repeat lipids 11/2021 showed LDL 54, trig 39, HDL 67.  Today, she complains of feeling "achy." She is not sure if this is associated with her medication but the myalgias did seem to correlate with starting the Crestor. Her symptoms are preventing her from wanting to walk as much as she did previously. She does try to keep up her walking.  Reportedly her hemoglobin was closer to 14.0 the last time she donated blood. In clinic today her blood pressure is 132/78. She reports average home readings closer to [id number] systolic. She is tolerating amlodipine 2.5 mg with no concerns for LE swelling.  She denies any palpitations, chest pain, shortness of breath, lightheadedness, headaches, syncope, orthopnea, or PND.   Past Medical History:  Diagnosis Date   Allergic rhinitis    allergist evaluation spring fall and dust.   Ectopic pregnancy    x 2   GERD (gastroesophageal reflux disease)    Hematuria, microscopic    eval 2001  Dr Terance Hart    Past  Surgical History:  Procedure Laterality Date   LAPAROSCOPY     ectopic pregnancy   LAPAROSCOPY FOR ECTOPIC PREGNANCY     x2    Current Medications: Current Meds  Medication Sig   amLODipine (NORVASC) 2.5 MG tablet TAKE 1 TABLET BY MOUTH EVERY DAY FOR BLOOD PRESSURE   cetirizine (ZYRTEC) 10 MG tablet Take 10 mg by mouth daily.   Cholecalciferol (VITAMIN D3 MAXIMUM STRENGTH) 125 MCG (5000 UT) capsule Take 5,000 Units by mouth in the morning and at bedtime. With vitamin K and minerals   EPIPEN 2-PAK 0.3 MG/0.3ML SOAJ injection    Estradiol 10 MCG TABS vaginal tablet Place 1 each vaginally once a week.   fish oil-omega-3 fatty acids 1000 MG capsule Take 2 g by mouth daily.   METAMUCIL MULTIHEALTH FIBER 63 % POWD Take by mouth daily in the afternoon.   omeprazole (PRILOSEC) 20 MG capsule Take 1 capsule (20 mg total) by mouth 2 (two) times daily before a meal.   rosuvastatin (CRESTOR) 5 MG tablet Take 1 tablet (5 mg total) by mouth daily.     Allergies:   Iohexol   Social History   Tobacco Use   Smoking status: Never   Smokeless tobacco: Never  Vaping Use   Vaping Use: Never used  Substance Use Topics   Alcohol use: Yes    Comment: occasional wine   Drug use: No     Family History:  The patient's family history includes Arthritis in an other family member; Colon cancer in her paternal uncle; Colon cancer (age of onset: 49) in her father; Hypertension in an other family member; Parkinsonism in an other family member. There is no history of Pancreatic cancer, Prostate cancer, Rectal cancer, Stomach cancer, or Esophageal cancer.  ROS:   Please see the history of present illness.    (+) Myalgias All other systems reviewed and are negative.  EKGs/Labs/Other Studies Reviewed:    The following studies were reviewed today:  Coronary CT  04/21/2020: IMPRESSION: 1. No evidence of CAD, CADRADS = 0.   2. Coronary calcium score of 0.   3. Normal coronary origin with right  dominance.  Echocardiogram  04/14/2020:  1. Left ventricular ejection fraction, by estimation, is 60 to 65%. The  left ventricle has normal function. The left ventricle has no regional  wall motion abnormalities. There is mild left ventricular hypertrophy.  Left ventricular diastolic parameters  are consistent with Grade I diastolic dysfunction (impaired relaxation).   2. Right ventricular systolic function is normal. The right ventricular  size is normal. Tricuspid regurgitation signal is inadequate for assessing  PA pressure.   3. The mitral valve is normal in structure. Trivial mitral valve  regurgitation. No evidence of mitral stenosis.   4. The aortic valve is tricuspid. Aortic valve regurgitation is not  visualized. No aortic stenosis is present.   5. The inferior vena cava is normal in size with greater than 50%  respiratory variability, suggesting right atrial pressure of 3 mmHg.   Bilateral Renal Artery Doppler  04/07/2020: Summary:  Largest Aortic Diameter: 1.8 cm    Renal:    Right: Normal size right kidney. Normal right Resisitive Index.         Normal cortical thickness of right kidney. No evidence of         right renal artery stenosis. RRV flow present.  Left:  Normal size of left kidney. Normal left Resistive Index.         Normal cortical thickness of the left kidney. No evidence of         left renal artery stenosis. LRV flow present.  Mesenteric:  Normal Celiac artery and Superior Mesenteric artery findings.    EKG:   EKG is personally reviewed. 08/18/2022:  NSR 08/12/2021:  NSR  Recent Labs: 11/25/2021: ALT 23; BUN 16; Creatinine, Ser 0.87; Hemoglobin 11.7; Platelets 328.0; Potassium 4.8; Sodium 140; TSH 1.52   Recent Lipid Panel    Component Value Date/Time   CHOL 130 11/25/2021 1232   TRIG 39.0 11/25/2021 1232   TRIG 99 04/11/2006 0806   HDL 67.70 11/25/2021 1232   CHOLHDL 2 11/25/2021 1232   VLDL 7.8 11/25/2021 1232   LDLCALC 54 11/25/2021 1232    LDLDIRECT 132.6 04/16/2011 0930    Physical Exam:    VS:  BP 132/78   Pulse 68   Ht 5\' 2"  (1.575 m)   Wt 154 lb 6.4 oz (70 kg)   SpO2 96%   BMI 28.24 kg/m     Wt Readings from Last 5 Encounters:  08/18/22 154 lb 6.4 oz (70 kg)  06/15/22 153 lb (69.4 kg)  06/08/22 153 lb 3.2 oz (69.5 kg)  01/05/22 148 lb (67.1 kg)  11/25/21 147 lb 12.8 oz (67 kg)    Constitutional: No acute distress Eyes: sclera non-icteric, normal conjunctiva and lids ENMT: normal dentition, moist mucous membranes Cardiovascular: regular rhythm, normal rate, no murmurs. S1 and S2  normal. No jugular venous distention.  Respiratory: clear to auscultation bilaterally GI : normal bowel sounds, soft and nontender. No distention.   MSK: extremities warm, well perfused. No edema.  NEURO: grossly nonfocal exam, moves all extremities. PSYCH: alert and oriented x 3, normal mood and affect.   ASSESSMENT:    1. Pure hypercholesterolemia   2. Precordial pain   3. Hypertension, unspecified type   4. Cardiac risk counseling   5. Medication management     PLAN:    Precordial pain - normal CCTA. Chest pain has resolved. Monitor symptoms.  Hypertension, unspecified type - BP well controlled on single low dose agent, continue amlodipine 2.5 mg daily. Normal renal ultrasound.   Pure hypercholesterolemia - LDL 54 on crestor 5 mg daily, however myalgias present. Will have her take a month statin holiday, and resume at either 2.5 mg daily or 5 mg MWF. If not well tolerated, can consider pravastatin given normal coronary CTA.    Total time of encounter: 27 minutes total time of encounter, including 15 minutes spent in face-to-face patient care on the date of this encounter. This time includes coordination of care and counseling regarding above mentioned problem list. Remainder of non-face-to-face time involved reviewing chart documents/testing relevant to the patient encounter and documentation in the medical record. I  have independently reviewed documentation from referring provider.   Cherlynn Kaiser, MD, [facility]   Medication Adjustments/Labs and Tests Ordered: Current medicines are reviewed at length with the patient today.  Concerns regarding medicines are outlined above.   Orders Placed This Encounter  Procedures   EKG 12-Lead   No orders of the defined types were placed in this encounter.  Patient Instructions  Medication Instructions:   OKAY TO STOP YOUR STATIN MEDICATION FOR 1 MONTH- THEN RESUME AT EITHER 5mg  [place], [place], [place]. OR CAN TAKE 2.5mg  EVERYDAY   PLEASE LET us KNOW HOW YOU ARE DOING IN 1 MONTH  *If you need a refill on your cardiac medications before your next appointment, please call your pharmacy*  Follow-Up: At [facility], you and your health needs are our priority.  As part of our continuing mission to provide you with exceptional heart care, we have created designated Provider Care Teams.  These Care Teams include your primary Cardiologist (physician) and Advanced Practice Providers (APPs -  Physician Assistants and Nurse Practitioners) who all work together to provide you with the care you need, when you need it.  Your next appointment:   1 year(s)  Provider:   Elouise Munroe, MD      [facility]ump[facility],acting as a scribe for Elouise Munroe, MD.,have documented all relevant documentation on the behalf of Elouise Munroe, MD,as directed by  Elouise Munroe, MD while in the presence of Elouise Munroe, MD.  I, Elouise Munroe, MD, have reviewed all documentation for this visit. The documentation on 08/18/22 for the exam, diagnosis, procedures, and orders are all accurate and complete.

## 2022-08-30 DIAGNOSIS — J301 Allergic rhinitis due to pollen: Secondary | ICD-10-CM | POA: Diagnosis not present

## 2022-08-30 DIAGNOSIS — J3081 Allergic rhinitis due to animal (cat) (dog) hair and dander: Secondary | ICD-10-CM | POA: Diagnosis not present

## 2022-08-30 DIAGNOSIS — J3089 Other allergic rhinitis: Secondary | ICD-10-CM | POA: Diagnosis not present

## 2022-09-13 DIAGNOSIS — J301 Allergic rhinitis due to pollen: Secondary | ICD-10-CM | POA: Diagnosis not present

## 2022-09-13 DIAGNOSIS — J3081 Allergic rhinitis due to animal (cat) (dog) hair and dander: Secondary | ICD-10-CM | POA: Diagnosis not present

## 2022-09-13 DIAGNOSIS — J3089 Other allergic rhinitis: Secondary | ICD-10-CM | POA: Diagnosis not present

## 2022-09-21 ENCOUNTER — Encounter: Payer: Self-pay | Admitting: Internal Medicine

## 2022-10-04 DIAGNOSIS — J3089 Other allergic rhinitis: Secondary | ICD-10-CM | POA: Diagnosis not present

## 2022-10-04 DIAGNOSIS — J3081 Allergic rhinitis due to animal (cat) (dog) hair and dander: Secondary | ICD-10-CM | POA: Diagnosis not present

## 2022-10-04 DIAGNOSIS — J301 Allergic rhinitis due to pollen: Secondary | ICD-10-CM | POA: Diagnosis not present

## 2022-10-07 DIAGNOSIS — Z1231 Encounter for screening mammogram for malignant neoplasm of breast: Secondary | ICD-10-CM | POA: Diagnosis not present

## 2022-10-07 DIAGNOSIS — M8588 Other specified disorders of bone density and structure, other site: Secondary | ICD-10-CM | POA: Diagnosis not present

## 2022-10-07 DIAGNOSIS — Z01419 Encounter for gynecological examination (general) (routine) without abnormal findings: Secondary | ICD-10-CM | POA: Diagnosis not present

## 2022-10-07 LAB — HM DEXA SCAN

## 2022-10-18 DIAGNOSIS — J3081 Allergic rhinitis due to animal (cat) (dog) hair and dander: Secondary | ICD-10-CM | POA: Diagnosis not present

## 2022-10-18 DIAGNOSIS — J3089 Other allergic rhinitis: Secondary | ICD-10-CM | POA: Diagnosis not present

## 2022-10-18 DIAGNOSIS — J301 Allergic rhinitis due to pollen: Secondary | ICD-10-CM | POA: Diagnosis not present

## 2022-10-22 DIAGNOSIS — J301 Allergic rhinitis due to pollen: Secondary | ICD-10-CM | POA: Diagnosis not present

## 2022-10-22 DIAGNOSIS — J3089 Other allergic rhinitis: Secondary | ICD-10-CM | POA: Diagnosis not present

## 2022-10-22 DIAGNOSIS — J3081 Allergic rhinitis due to animal (cat) (dog) hair and dander: Secondary | ICD-10-CM | POA: Diagnosis not present

## 2022-10-27 DIAGNOSIS — J3081 Allergic rhinitis due to animal (cat) (dog) hair and dander: Secondary | ICD-10-CM | POA: Diagnosis not present

## 2022-10-27 DIAGNOSIS — J301 Allergic rhinitis due to pollen: Secondary | ICD-10-CM | POA: Diagnosis not present

## 2022-10-27 DIAGNOSIS — J3089 Other allergic rhinitis: Secondary | ICD-10-CM | POA: Diagnosis not present

## 2022-11-03 DIAGNOSIS — J3089 Other allergic rhinitis: Secondary | ICD-10-CM | POA: Diagnosis not present

## 2022-11-03 DIAGNOSIS — J3081 Allergic rhinitis due to animal (cat) (dog) hair and dander: Secondary | ICD-10-CM | POA: Diagnosis not present

## 2022-11-03 DIAGNOSIS — J301 Allergic rhinitis due to pollen: Secondary | ICD-10-CM | POA: Diagnosis not present

## 2022-11-09 DIAGNOSIS — J3081 Allergic rhinitis due to animal (cat) (dog) hair and dander: Secondary | ICD-10-CM | POA: Diagnosis not present

## 2022-11-09 DIAGNOSIS — J3089 Other allergic rhinitis: Secondary | ICD-10-CM | POA: Diagnosis not present

## 2022-11-09 DIAGNOSIS — J301 Allergic rhinitis due to pollen: Secondary | ICD-10-CM | POA: Diagnosis not present

## 2022-11-22 DIAGNOSIS — J3081 Allergic rhinitis due to animal (cat) (dog) hair and dander: Secondary | ICD-10-CM | POA: Diagnosis not present

## 2022-11-22 DIAGNOSIS — J3089 Other allergic rhinitis: Secondary | ICD-10-CM | POA: Diagnosis not present

## 2022-11-22 DIAGNOSIS — J301 Allergic rhinitis due to pollen: Secondary | ICD-10-CM | POA: Diagnosis not present

## 2022-12-06 DIAGNOSIS — J301 Allergic rhinitis due to pollen: Secondary | ICD-10-CM | POA: Diagnosis not present

## 2022-12-06 DIAGNOSIS — J3081 Allergic rhinitis due to animal (cat) (dog) hair and dander: Secondary | ICD-10-CM | POA: Diagnosis not present

## 2022-12-06 DIAGNOSIS — J3089 Other allergic rhinitis: Secondary | ICD-10-CM | POA: Diagnosis not present

## 2022-12-07 ENCOUNTER — Encounter: Payer: Self-pay | Admitting: Internal Medicine

## 2022-12-08 ENCOUNTER — Other Ambulatory Visit: Payer: Self-pay | Admitting: Internal Medicine

## 2022-12-08 MED ORDER — ROSUVASTATIN CALCIUM 5 MG PO TABS: 5.0000 mg | ORAL_TABLET | Freq: Every day | ORAL | 3 refills | Status: DC

## 2022-12-13 ENCOUNTER — Ambulatory Visit (INDEPENDENT_AMBULATORY_CARE_PROVIDER_SITE_OTHER): Payer: Medicare HMO

## 2022-12-13 VITALS — Ht 62.0 in | Wt 154.0 lb

## 2022-12-13 DIAGNOSIS — Z0000 Encounter for general adult medical examination without abnormal findings: Secondary | ICD-10-CM | POA: Diagnosis not present

## 2022-12-13 NOTE — Telephone Encounter (Signed)
Call to pharmacy regarding prescription Crestor. They state no issues with this medication and it is in process of being filled.

## 2022-12-13 NOTE — Progress Notes (Signed)
Subjective:   Kimberly Schroeder is a 72 y.o. female who presents for Medicare Annual (Subsequent) preventive examination.  Visit Complete: Virtual  I connected with  [name] on 12/13/22 by a audio enabled telemedicine application and verified that I am speaking with the correct person using two identifiers.  Patient Location: Home  Provider Location: Home Office  I discussed the limitations of evaluation and management by telemedicine. The patient expressed understanding and agreed to proceed.  Patient Medicare AWV questionnaire was completed by the patient on 12/09/22; I have confirmed that all information answered by patient is correct and no changes since this date.  Review of Systems    Vital Signs: Unable to obtain new vitals due to this being a telehealth visit.  Cardiac Risk Factors include: advanced age (>57men, >62 women)     Objective:    Today's Vitals   12/13/22 1344  Weight: 154 lb (69.9 kg)  Height: 5\' 2"  (1.575 m)   Body mass index is 28.17 kg/m.     12/13/2022    1:52 PM 01/05/2022    3:42 PM 01/13/2021    9:11 PM 11/04/2020    9:03 AM 03/06/2020    4:23 PM 06/23/2016    7:57 AM 10/14/2015    1:36 PM  Advanced Directives  Does Patient Have a Medical Advance Directive? Yes Yes No Yes No Yes Yes  Type of Estate agent of [place];Living will Healthcare Power of [place];Living will  Healthcare Power of [place];Living will  Healthcare Power of [place];Living will Healthcare Power of [place];Living will  Does patient want to make changes to medical advance directive?       Yes - information given  Copy of Healthcare Power of Attorney in Chart? No - copy requested No - copy requested  No - copy requested   No - copy requested  Would patient like information on creating a medical advance directive?   No - Patient declined        Current Medications (verified) Outpatient Encounter Medications as of 12/13/2022  Medication Sig    amLODipine (NORVASC) 2.5 MG tablet TAKE 1 TABLET BY MOUTH EVERY DAY FOR BLOOD PRESSURE   cetirizine (ZYRTEC) 10 MG tablet Take 10 mg by mouth daily.   Cholecalciferol (VITAMIN D3 MAXIMUM STRENGTH) 125 MCG (5000 UT) capsule Take 5,000 Units by mouth in the morning and at bedtime. With vitamin K and minerals   EPIPEN 2-PAK 0.3 MG/0.3ML SOAJ injection    Estradiol 10 MCG TABS vaginal tablet Place 1 each vaginally once a week.   fish oil-omega-3 fatty acids 1000 MG capsule Take 2 g by mouth daily.   METAMUCIL MULTIHEALTH FIBER 63 % POWD Take by mouth daily in the afternoon.   omeprazole (PRILOSEC) 20 MG capsule Take 1 capsule (20 mg total) by mouth 2 (two) times daily before a meal.   rosuvastatin (CRESTOR) 5 MG tablet Take 1 tablet (5 mg total) by mouth daily. Take half tablet 2.5 mg Daily   No facility-administered encounter medications on file as of 12/13/2022.    Allergies (verified) Iohexol   History: Past Medical History:  Diagnosis Date   Allergic rhinitis    allergist evaluation spring fall and dust.   Ectopic pregnancy    x 2   GERD (gastroesophageal reflux disease)    Hematuria, microscopic    eval 2001  Dr Vonita Moss   Past Surgical History:  Procedure Laterality Date   LAPAROSCOPY     ectopic pregnancy  LAPAROSCOPY FOR ECTOPIC PREGNANCY     x2   Family History  Problem Relation Age of Onset   Colon cancer Father 21   Colon cancer Paternal Uncle        48's   Parkinsonism Other    Arthritis Other    Hypertension Other    Pancreatic cancer Neg Hx    Prostate cancer Neg Hx    Rectal cancer Neg Hx    Stomach cancer Neg Hx    Esophageal cancer Neg Hx    Social History   Socioeconomic History   Marital status: Married    Spouse name: Not on file   Number of children: 0   Years of education: Not on file   Highest education level: Not on file  Occupational History   Occupation: retired  Tobacco Use   Smoking status: Never   Smokeless tobacco: Never  Vaping  Use   Vaping status: Never Used  Substance and Sexual Activity   Alcohol use: Yes    Comment: occasional wine   Drug use: No   Sexual activity: Not on file  Other Topics Concern   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    Social Determinants of Health   Financial Resource Strain: Low Risk  (12/13/2022)   Overall Financial Resource Strain (CARDIA)    Difficulty of Paying Living Expenses: Not hard at all  Food Insecurity: No Food Insecurity (12/13/2022)   Hunger Vital Sign    Worried About Running Out of Food in the Last Year: Never true    Ran Out of Food in the Last Year: Never true  Transportation Needs: No Transportation Needs (12/13/2022)   PRAPARE - Administrator, Civil Service (Medical): No    Lack of Transportation (Non-Medical): No  Physical Activity: Sufficiently Active (12/13/2022)   Exercise Vital Sign    Days of Exercise per Week: 6 days    Minutes of Exercise per Session: 40 min  Stress: No Stress Concern Present (12/13/2022)   [employer] of Occupational Health - Occupational Stress Questionnaire    Feeling of Stress : Not at all  Social Connections: Socially Integrated (12/13/2022)   Social Connection and Isolation Panel [NHANES]    Frequency of Communication with Friends and Family: More than three times a week    Frequency of Social Gatherings with Friends and Family: More than three times a week    Attends Religious Services: More than 4 times per year    Active Member of [employer] or Organizations: Yes    Attends Engineer, structural: More than 4 times per year    Marital Status: Married    Tobacco Counseling Counseling given: Not Answered   Clinical Intake:  Pre-visit  preparation completed: Yes  Pain : No/denies pain     BMI - recorded: 28.17 Nutritional Status: BMI 25 -29 Overweight Nutritional Risks: None Diabetes: No  How often do you need to have someone help you when you read instructions, pamphlets, or other written materials from your doctor or pharmacy?: 1 - Never  Interpreter Needed?: No  Information entered by :: Theresa Mulligan LPN   Activities  of Daily Living    12/13/2022    1:51 PM 12/09/2022    9:06 AM  In your present state of health, do you have any difficulty performing the following activities:  Hearing? 0 0  Vision? 0 0  Difficulty concentrating or making decisions? 0 0  Walking or climbing stairs? 0 0  Dressing or bathing? 0 0  Doing errands, shopping? 0 0  Preparing Food and eating ? N N  Using the Toilet? N N  In the past six months, have you accidently leaked urine? N N  Do you have problems with loss of bowel control? N N  Managing your Medications? N N  Managing your Finances? N N  Housekeeping or managing your Housekeeping? N N    Patient Care Team: Panosh, Neta Mends, MD as PCP - General Parke Poisson, MD as PCP - Cardiology (Cardiology) Sidney Ace, MD (Allergy) Marcelle Overlie, MD (Obstetrics and Gynecology) Napoleon Form, MD as Consulting Physician (Gastroenterology) [place], Amy, MD as Consulting Physician (Dermatology)  Indicate any recent Medical Services you may have received from other than [facility] providers in the past year (date may be approximate).     Assessment:   This is a routine wellness examination for Kimberly Schroeder.  Hearing/Vision screen Hearing Screening - Comments:: Denies hearing difficulties   Vision Screening - Comments:: Wears rx glasses - up to date with routine eye exams with  Dr Emily Filbert  Dietary issues and exercise activities discussed:     Goals Addressed               This Visit's Progress     Stay Healthy (pt-stated)         Depression Screen     12/13/2022    1:50 PM 01/05/2022    3:43 PM 11/25/2021   11:52 AM 11/24/2020   11:23 AM 10/12/2019   10:27 AM 04/04/2018    8:57 AM 03/29/2017    9:04 AM  PHQ 2/9 Scores  PHQ - 2 Score 0 0 0 0 0 0 0  PHQ- 9 Score   0 0 0      Fall Risk    12/13/2022    1:51 PM 12/09/2022    9:06 AM 01/05/2022    3:43 PM 01/01/2022    6:36 PM 11/25/2021   11:52 AM  Fall Risk   Falls in the past year? 0 0 0 0 0  Number falls in past yr: 0  0 0 0  Injury with Fall? 0  0  0  Risk for fall due to : No Fall Risks  Medication side effect  No Fall Risks  Follow up Falls prevention discussed  Education provided;Falls prevention discussed;Falls evaluation completed  Falls prevention discussed    MEDICARE RISK AT HOME:  Medicare Risk at Home - 12/13/22 1401     Any stairs in or around the home? Yes    If so, are there any without handrails? No    Home free of loose throw rugs in walkways, pet beds, electrical cords, etc? Yes    Adequate lighting in your home to reduce risk of falls? Yes    Life alert? No    Use of a cane, walker or w/c? No    Grab bars in the bathroom? Yes    Shower chair or bench in shower? Yes    Elevated toilet seat or a handicapped toilet? No             TIMED UP AND  GO:  Was the test performed?  No    Cognitive Function:        12/13/2022    1:52 PM 01/05/2022    3:44 PM  6CIT Screen  What Year? 0 points 0 points  What month? 0 points 0 points  What time? 0 points 0 points  Count back from 20 0 points 0 points  Months in reverse 0 points 0 points  Repeat phrase 0 points 2 points  Total Score 0 points 2 points    Immunizations Immunization History  Administered Date(s) Administered   Fluad Quad(high Dose 65+) 01/17/2019, 02/08/2020, 03/09/2021, 02/11/2022   Influenza Split 02/17/2011   Influenza, High Dose Seasonal PF 03/09/2018   Influenza,inj,Quad PF,6+ Mos 01/25/2013, 02/12/2014, 02/23/2016, 03/04/2017   PFIZER(Purple Top)SARS-COV-2 Vaccination 06/07/2019,  06/28/2019, 04/16/2020   Pneumococcal Conjugate-13 04/04/2018   Pneumococcal Polysaccharide-23 10/12/2019   Td 04/28/2006   Tdap 03/29/2017   Zoster Recombinant(Shingrix) 11/29/2018, 11/22/2019   Zoster, Live 11/20/2012, 06/18/2019    TDAP status: Up to date  Flu Vaccine status: Up to date  Pneumococcal vaccine status: Up to date  Covid-19 vaccine status: Completed vaccines  Qualifies for Shingles Vaccine? Yes   Zostavax completed Yes   Shingrix Completed?: Yes  Screening Tests Health Maintenance  Topic Date Due   Diabetic kidney evaluation - Urine ACR  Never done   Diabetic kidney evaluation - eGFR measurement  11/26/2022   COVID-19 Vaccine (6 - 2023-24 season) 12/29/2022 (Originally 01/15/2022)   INFLUENZA VACCINE  12/16/2022   MAMMOGRAM  10/07/2023   Medicare Annual Wellness (AWV)  12/13/2023   DTaP/Tdap/Td (3 - Td or Tdap) 03/30/2027   Colonoscopy  06/16/2027   Pneumonia Vaccine 52+ Years old  Completed   DEXA SCAN  Completed   Zoster Vaccines- Shingrix  Completed   HPV VACCINES  Aged Out    Health Maintenance  Health Maintenance Due  Topic Date Due   Diabetic kidney evaluation - Urine ACR  Never done   Diabetic kidney evaluation - eGFR measurement  11/26/2022    Colorectal cancer screening: Type of screening: Colonoscopy. Completed 06/15/22. Repeat every 5 years  Mammogram status: Completed  . Repeat every year  Bone Density status: Completed 10/07/22. Results reflect: Bone density results: OSTEOPENIA. Repeat every   years.  Lung Cancer Screening: (Low Dose CT Chest recommended if Age 43-80 years, 20 pack-year currently smoking OR have quit w/in 15years.) does not qualify.     Additional Screening:   Vision Screening: Recommended annual ophthalmology exams for early detection of glaucoma and other disorders of the eye. Is the patient up to date with their annual eye exam?  Yes  Who is the provider or what is the name of the office in which the patient  attends annual eye exams? Dr Emily Filbert If pt is not established with a provider, would they like to be referred to a provider to establish care? No .   Dental Screening: Recommended annual dental exams for proper oral hygiene    Community Resource Referral / Chronic Care Management:  CRR required this visit?  No   CCM required this visit?  No     Plan:     I have personally reviewed and noted the following in the patient's chart:   Medical and social history Use of alcohol, tobacco or illicit drugs  Current medications and supplements including opioid prescriptions. Patient is not currently taking opioid prescriptions. Functional ability and status Nutritional status Physical activity Advanced directives List of other physicians  Hospitalizations, surgeries, and ER visits in previous 12 months Vitals Screenings to include cognitive, depression, and falls Referrals and appointments  In addition, I have reviewed and discussed with patient certain preventive protocols, quality metrics, and best practice recommendations. A written personalized care plan for preventive services as well as general preventive health recommendations were provided to patient.     Tillie Rung, LPN   [id number]   After Visit Summary: (MyChart) Due to this being a telephonic visit, the after visit summary with patients personalized plan was offered to patient via MyChart   Nurse Notes: Patient due Diabetic kidney evaluation eGFR and ACR

## 2022-12-13 NOTE — Patient Instructions (Addendum)
Kimberly Schroeder , Thank you for taking time to come for your Medicare Wellness Visit. I appreciate your ongoing commitment to your health goals. Please review the following plan we discussed and let me know if I can assist you in the future.   Referrals/Orders/Follow-Ups/Clinician Recommendations:   This is a list of the screening recommended for you and due dates:  Health Maintenance  Topic Date Due   Yearly kidney health urinalysis for diabetes  Never done   Yearly kidney function blood test for diabetes  11/26/2022   COVID-19 Vaccine (6 - 2023-24 season) 12/29/2022*   Flu Shot  12/16/2022   Mammogram  10/07/2023   Medicare Annual Wellness Visit  12/13/2023   DTaP/Tdap/Td vaccine (3 - Td or Tdap) 03/30/2027   Colon Cancer Screening  06/16/2027   Pneumonia Vaccine  Completed   DEXA scan (bone density measurement)  Completed   Zoster (Shingles) Vaccine  Completed   HPV Vaccine  Aged Out  *Topic was postponed. The date shown is not the original due date.    Advanced directives: (Copy Requested) Please bring a copy of your health care power of attorney and living will to the office to be added to your chart at your convenience.  Next Medicare Annual Wellness Visit scheduled for next year: Yes  Preventive Care 71 Years and Older, Female Preventive care refers to lifestyle choices and visits with your health care provider that can promote health and wellness. What does preventive care include? A yearly physical exam. This is also called an annual well check. Dental exams once or twice a year. Routine eye exams. Ask your health care provider how often you should have your eyes checked. Personal lifestyle choices, including: Daily care of your teeth and gums. Regular physical activity. Eating a healthy diet. Avoiding tobacco and drug use. Limiting alcohol use. Practicing safe sex. Taking low-dose aspirin every day. Taking vitamin and mineral supplements as recommended by your health care  provider. What happens during an annual well check? The services and screenings done by your health care provider during your annual well check will depend on your age, overall health, lifestyle risk factors, and family history of disease. Counseling  Your health care provider may ask you questions about your: Alcohol use. Tobacco use. Drug use. Emotional well-being. Home and relationship well-being. Sexual activity. Eating habits. History of falls. Memory and ability to understand (cognition). Work and work Astronomer. Reproductive health. Screening  You may have the following tests or measurements: Height, weight, and BMI. Blood pressure. Lipid and cholesterol levels. These may be checked every 5 years, or more frequently if you are over 38 years old. Skin check. Lung cancer screening. You may have this screening every year starting at age 52 if you have a 30-pack-year history of smoking and currently smoke or have quit within the past 15 years. Fecal occult blood test (FOBT) of the stool. You may have this test every year starting at age 51. Flexible sigmoidoscopy or colonoscopy. You may have a sigmoidoscopy every 5 years or a colonoscopy every 10 years starting at age 28. Hepatitis C blood test. Hepatitis B blood test. Sexually transmitted disease (STD) testing. Diabetes screening. This is done by checking your blood sugar (glucose) after you have not eaten for a while (fasting). You may have this done every 1-3 years. Bone density scan. This is done to screen for osteoporosis. You may have this done starting at age 22. Mammogram. This may be done every 1-2 years. Talk to your  health care provider about how often you should have regular mammograms. Talk with your health care provider about your test results, treatment options, and if necessary, the need for more tests. Vaccines  Your health care provider may recommend certain vaccines, such as: Influenza vaccine. This is  recommended every year. Tetanus, diphtheria, and acellular pertussis (Tdap, Td) vaccine. You may need a Td booster every 10 years. Zoster vaccine. You may need this after age 44. Pneumococcal 13-valent conjugate (PCV13) vaccine. One dose is recommended after age 78. Pneumococcal polysaccharide (PPSV23) vaccine. One dose is recommended after age 34. Talk to your health care provider about which screenings and vaccines you need and how often you need them. This information is not intended to replace advice given to you by your health care provider. Make sure you discuss any questions you have with your health care provider. Document Released: 05/30/2015 Document Revised: 01/21/2016 Document Reviewed: 03/04/2015 Elsevier Interactive Patient Education  2017 [employer].  Fall Prevention in the Home Falls can cause injuries. They can happen to people of all ages. There are many things you can do to make your home safe and to help prevent falls. What can I do on the outside of my home? Regularly fix the edges of walkways and driveways and fix any cracks. Remove anything that might make you trip as you walk through a door, such as a raised step or threshold. Trim any bushes or trees on the path to your home. Use bright outdoor lighting. Clear any walking paths of anything that might make someone trip, such as rocks or tools. Regularly check to see if handrails are loose or broken. Make sure that both sides of any steps have handrails. Any raised decks and porches should have guardrails on the edges. Have any leaves, snow, or ice cleared regularly. Use sand or salt on walking paths during winter. Clean up any spills in your garage right away. This includes oil or grease spills. What can I do in the bathroom? Use night lights. Install grab bars by the toilet and in the tub and shower. Do not use towel bars as grab bars. Use non-skid mats or decals in the tub or shower. If you need to sit down in  the shower, use a plastic, non-slip stool. Keep the floor dry. Clean up any water that spills on the floor as soon as it happens. Remove soap buildup in the tub or shower regularly. Attach bath mats securely with double-sided non-slip rug tape. Do not have throw rugs and other things on the floor that can make you trip. What can I do in the bedroom? Use night lights. Make sure that you have a light by your bed that is easy to reach. Do not use any sheets or blankets that are too big for your bed. They should not hang down onto the floor. Have a firm chair that has side arms. You can use this for support while you get dressed. Do not have throw rugs and other things on the floor that can make you trip. What can I do in the kitchen? Clean up any spills right away. Avoid walking on wet floors. Keep items that you use a lot in easy-to-reach places. If you need to reach something above you, use a strong step stool that has a grab bar. Keep electrical cords out of the way. Do not use floor polish or wax that makes floors slippery. If you must use wax, use non-skid floor wax. Do not have  throw rugs and other things on the floor that can make you trip. What can I do with my stairs? Do not leave any items on the stairs. Make sure that there are handrails on both sides of the stairs and use them. Fix handrails that are broken or loose. Make sure that handrails are as long as the stairways. Check any carpeting to make sure that it is firmly attached to the stairs. Fix any carpet that is loose or worn. Avoid having throw rugs at the top or bottom of the stairs. If you do have throw rugs, attach them to the floor with carpet tape. Make sure that you have a light switch at the top of the stairs and the bottom of the stairs. If you do not have them, ask someone to add them for you. What else can I do to help prevent falls? Wear shoes that: Do not have high heels. Have rubber bottoms. Are comfortable  and fit you well. Are closed at the toe. Do not wear sandals. If you use a stepladder: Make sure that it is fully opened. Do not climb a closed stepladder. Make sure that both sides of the stepladder are locked into place. Ask someone to hold it for you, if possible. Clearly mark and make sure that you can see: Any grab bars or handrails. First and last steps. Where the edge of each step is. Use tools that help you move around (mobility aids) if they are needed. These include: Canes. Walkers. Scooters. Crutches. Turn on the lights when you go into a dark area. Replace any light bulbs as soon as they burn out. Set up your furniture so you have a clear path. Avoid moving your furniture around. If any of your floors are uneven, fix them. If there are any pets around you, be aware of where they are. Review your medicines with your doctor. Some medicines can make you feel dizzy. This can increase your chance of falling. Ask your doctor what other things that you can do to help prevent falls. This information is not intended to replace advice given to you by your health care provider. Make sure you discuss any questions you have with your health care provider. Document Released: 02/27/2009 Document Revised: 10/09/2015 Document Reviewed: 06/07/2014 Elsevier Interactive Patient Education  2017 [employer].

## 2022-12-20 DIAGNOSIS — J3089 Other allergic rhinitis: Secondary | ICD-10-CM | POA: Diagnosis not present

## 2022-12-20 DIAGNOSIS — J301 Allergic rhinitis due to pollen: Secondary | ICD-10-CM | POA: Diagnosis not present

## 2022-12-20 DIAGNOSIS — J3081 Allergic rhinitis due to animal (cat) (dog) hair and dander: Secondary | ICD-10-CM | POA: Diagnosis not present

## 2023-01-03 DIAGNOSIS — J301 Allergic rhinitis due to pollen: Secondary | ICD-10-CM | POA: Diagnosis not present

## 2023-01-03 DIAGNOSIS — J3081 Allergic rhinitis due to animal (cat) (dog) hair and dander: Secondary | ICD-10-CM | POA: Diagnosis not present

## 2023-01-03 DIAGNOSIS — J3089 Other allergic rhinitis: Secondary | ICD-10-CM | POA: Diagnosis not present

## 2023-01-19 ENCOUNTER — Other Ambulatory Visit: Payer: Self-pay | Admitting: Internal Medicine

## 2023-01-19 DIAGNOSIS — J3089 Other allergic rhinitis: Secondary | ICD-10-CM | POA: Diagnosis not present

## 2023-01-19 DIAGNOSIS — J3081 Allergic rhinitis due to animal (cat) (dog) hair and dander: Secondary | ICD-10-CM | POA: Diagnosis not present

## 2023-01-19 DIAGNOSIS — J301 Allergic rhinitis due to pollen: Secondary | ICD-10-CM | POA: Diagnosis not present

## 2023-01-21 ENCOUNTER — Telehealth: Payer: Self-pay

## 2023-01-21 NOTE — Telephone Encounter (Signed)
Cpe: 11/25/2021  Attempted to reach pt to schedule a physical visit. Left a voicemail to call us back.

## 2023-01-31 DIAGNOSIS — J3089 Other allergic rhinitis: Secondary | ICD-10-CM | POA: Diagnosis not present

## 2023-01-31 DIAGNOSIS — J3081 Allergic rhinitis due to animal (cat) (dog) hair and dander: Secondary | ICD-10-CM | POA: Diagnosis not present

## 2023-01-31 DIAGNOSIS — J301 Allergic rhinitis due to pollen: Secondary | ICD-10-CM | POA: Diagnosis not present

## 2023-02-08 NOTE — Progress Notes (Unsigned)
No chief complaint on file.   HPI: Patient  Kimberly Schroeder  72 y.o. comes in today for Preventive Health Care visit  Last pv 7 23  HT  amlodipine HLD   Health Maintenance  Topic Date Due   Diabetic kidney evaluation - Urine ACR  Never done   Diabetic kidney evaluation - eGFR measurement  11/26/2022   INFLUENZA VACCINE  12/16/2022   COVID-19 Vaccine (6 - 2023-24 season) 01/16/2023   MAMMOGRAM  10/07/2023   Medicare Annual Wellness (AWV)  12/13/2023   DTaP/Tdap/Td (3 - Td or Tdap) 03/30/2027   Colonoscopy  06/16/2027   Pneumonia Vaccine 45+ Years old  Completed   DEXA SCAN  Completed   Zoster Vaccines- Shingrix  Completed   HPV VACCINES  Aged Out   Health Maintenance Review LIFESTYLE:  Exercise:   Tobacco/ETS: Alcohol:  Sugar beverages: Sleep: Drug use: no HH of  Work:    ROS:  GEN/ HEENT: No fever, significant weight changes sweats headaches vision problems hearing changes, CV/ PULM; No chest pain shortness of breath cough, syncope,edema  change in exercise tolerance. GI /GU: No adominal pain, vomiting, change in bowel habits. No blood in the stool. No significant GU symptoms. SKIN/HEME: ,no acute skin rashes suspicious lesions or bleeding. No lymphadenopathy, nodules, masses.  NEURO/ PSYCH:  No neurologic signs such as weakness numbness. No depression anxiety. IMM/ Allergy: No unusual infections.  Allergy .   REST of 12 system review negative except as per HPI   Past Medical History:  Diagnosis Date   Allergic rhinitis    allergist evaluation spring fall and dust.   Ectopic pregnancy    x 2   GERD (gastroesophageal reflux disease)    Hematuria, microscopic    eval 2001  Dr Vonita Moss    Past Surgical History:  Procedure Laterality Date   LAPAROSCOPY     ectopic pregnancy   LAPAROSCOPY FOR ECTOPIC PREGNANCY     x2    Family History  Problem Relation Age of Onset   Colon cancer Father 26   Colon cancer Paternal Uncle        30's   Parkinsonism  Other    Arthritis Other    Hypertension Other    Pancreatic cancer Neg Hx    Prostate cancer Neg Hx    Rectal cancer Neg Hx    Stomach cancer Neg Hx    Esophageal cancer Neg Hx     Social History   Socioeconomic History   Marital status: Married    Spouse name: Not on file   Number of children: 0   Years of education: Not on file   Highest education level: Bachelor's degree (e.g., BA, AB, BS)  Occupational History   Occupation: retired  Tobacco Use   Smoking status: Never   Smokeless tobacco: Never  Vaping Use   Vaping status: Never Used  Substance and Sexual Activity   Alcohol use: Yes    Comment: occasional wine   Drug use: No   Sexual activity: Not on file  Other Topics Concern   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    Social Determinants  of Health   Financial Resource Strain: Low Risk  (12/13/2022)   Overall Financial Resource Strain (CARDIA)    Difficulty of Paying Living Expenses: Not hard at all  Food Insecurity: No Food Insecurity (02/05/2023)   Hunger Vital Sign    Worried About Running Out of Food in the Last Year: Never true    Ran Out of Food in the Last Year: Never true  Transportation Needs: No Transportation Needs (02/05/2023)   PRAPARE - Administrator, Civil Service (Medical): No    Lack of Transportation (Non-Medical): No  Physical Activity: Sufficiently Active (02/05/2023)   Exercise Vital Sign    Days of Exercise per Week: 6 days    Minutes of Exercise per Session: 30 min  Stress: No Stress Concern Present (02/05/2023)   [employer] of Occupational Health - Occupational Stress Questionnaire    Feeling of Stress : Only a little  Social Connections: Socially Integrated  (02/05/2023)   Social Connection and Isolation Panel [NHANES]    Frequency of Communication with Friends and Family: More than three times a week    Frequency of Social Gatherings with Friends and Family: Twice a week    Attends Religious Services: More than 4 times per year    Active Member of [employer] or Organizations: Yes    Attends Engineer, structural: More than 4 times per year    Marital Status: Married    Outpatient Medications Prior to Visit  Medication Sig Dispense Refill   amLODipine (NORVASC) 2.5 MG tablet TAKE 1 TABLET BY MOUTH EVERY DAY FOR BLOOD PRESSURE 90 tablet 0   cetirizine (ZYRTEC) 10 MG tablet Take 10 mg by mouth daily.     Cholecalciferol (VITAMIN D3 MAXIMUM STRENGTH) 125 MCG (5000 UT) capsule Take 5,000 Units by mouth in the morning and at bedtime. With vitamin K and minerals     EPIPEN 2-PAK 0.3 MG/0.3ML SOAJ injection   1   Estradiol 10 MCG TABS vaginal tablet Place 1 each vaginally once a week.     fish oil-omega-3 fatty acids 1000 MG capsule Take 2 g by mouth daily.     METAMUCIL MULTIHEALTH FIBER 63 % POWD Take by mouth daily in the afternoon.     omeprazole (PRILOSEC) 20 MG capsule Take 1 capsule (20 mg total) by mouth 2 (two) times daily before a meal. 180 capsule 3   rosuvastatin (CRESTOR) 5 MG tablet Take 1 tablet (5 mg total) by mouth daily. Take half tablet 2.5 mg Daily 90 tablet 3   No facility-administered medications prior to visit.     EXAM:  There were no vitals taken for this visit.  There is no height or weight on file to calculate BMI. Wt Readings from Last 3 Encounters:  12/13/22 154 lb (69.9 kg)  08/18/22 154 lb 6.4 oz (70 kg)  06/15/22 153 lb (69.4 kg)    Physical Exam: Vital signs reviewed ZOX:WRUE is a well-developed well-nourished alert cooperative    who appearsr stated age in no acute distress.  HEENT: normocephalic atraumatic , Eyes: PERRL EOM's full, conjunctiva clear, Nares: paten,t no deformity discharge or  tenderness., Ears: no deformity EAC's clear TMs with normal landmarks. Mouth: clear OP, no lesions, edema.  Moist mucous membranes. Dentition in adequate repair. NECK: supple without masses, thyromegaly or bruits. CHEST/PULM:  Clear to auscultation and percussion breath sounds equal no wheeze , rales or rhonchi. No chest wall deformities or tenderness. Breast: normal by inspection . No dimpling, discharge, masses,  tenderness or discharge . CV: PMI is nondisplaced, S1 S2 no gallops, murmurs, rubs. Peripheral pulses are full without delay.No JVD .  ABDOMEN: Bowel sounds normal nontender  No guard or rebound, no hepato splenomegal no CVA tenderness.   Extremtities:  No clubbing cyanosis or edema, no acute joint swelling or redness no focal atrophy NEURO:  Oriented x3, cranial nerves 3-12 appear to be intact, no obvious focal weakness,gait within normal limits no abnormal reflexes or asymmetrical SKIN: No acute rashes normal turgor, color, no bruising or petechiae. PSYCH: Oriented, good eye contact, no obvious depression anxiety, cognition and judgment appear normal. LN: no cervical axillary adenopathy  Lab Results  Component Value Date   WBC 5.2 11/25/2021   HGB 11.7 (L) 11/25/2021   HCT 35.5 (L) 11/25/2021   PLT 328.0 11/25/2021   GLUCOSE 92 11/25/2021   CHOL 130 11/25/2021   TRIG 39.0 11/25/2021   HDL 67.70 11/25/2021   LDLDIRECT 132.6 04/16/2011   LDLCALC 54 11/25/2021   ALT 23 11/25/2021   AST 23 11/25/2021   NA 140 11/25/2021   K 4.8 11/25/2021   CL 107 11/25/2021   CREATININE 0.87 11/25/2021   BUN 16 11/25/2021   CO2 29 11/25/2021   TSH 1.52 11/25/2021   HGBA1C 5.8 11/25/2021    BP Readings from Last 3 Encounters:  08/18/22 132/78  06/15/22 128/71  06/08/22 (!) 128/90    Lab plan reviewed with patient   ASSESSMENT AND PLAN:  Discussed the following assessment and plan:    ICD-10-CM   1. Visit for preventive health examination  Z00.00     2. Medication management   Z79.899     3. Primary osteoarthritis of both knees  M17.0     4. Elevated cholesterol  E78.00      No follow-ups on file.  Patient Care Team: Lafawn Lenoir, Neta Mends, MD as PCP - General Parke Poisson, MD as PCP - Cardiology (Cardiology) Sidney Ace, MD (Allergy) Marcelle Overlie, MD (Obstetrics and Gynecology) Napoleon Form, MD as Consulting Physician (Gastroenterology) [place], Amy, MD as Consulting Physician (Dermatology) There are no Patient Instructions on file for this visit.  Neta Mends. Davisha Linthicum M.D.

## 2023-02-09 ENCOUNTER — Other Ambulatory Visit (INDEPENDENT_AMBULATORY_CARE_PROVIDER_SITE_OTHER): Payer: Medicare HMO

## 2023-02-09 ENCOUNTER — Encounter: Payer: Self-pay | Admitting: Internal Medicine

## 2023-02-09 ENCOUNTER — Ambulatory Visit (INDEPENDENT_AMBULATORY_CARE_PROVIDER_SITE_OTHER): Payer: Medicare HMO | Admitting: Internal Medicine

## 2023-02-09 VITALS — BP 124/82 | HR 83 | Temp 97.8°F | Ht 62.0 in | Wt 152.0 lb

## 2023-02-09 DIAGNOSIS — I10 Essential (primary) hypertension: Secondary | ICD-10-CM

## 2023-02-09 DIAGNOSIS — K219 Gastro-esophageal reflux disease without esophagitis: Secondary | ICD-10-CM | POA: Diagnosis not present

## 2023-02-09 DIAGNOSIS — Z0000 Encounter for general adult medical examination without abnormal findings: Secondary | ICD-10-CM | POA: Diagnosis not present

## 2023-02-09 DIAGNOSIS — Z79899 Other long term (current) drug therapy: Secondary | ICD-10-CM

## 2023-02-09 DIAGNOSIS — Z23 Encounter for immunization: Secondary | ICD-10-CM | POA: Diagnosis not present

## 2023-02-09 DIAGNOSIS — M170 Bilateral primary osteoarthritis of knee: Secondary | ICD-10-CM | POA: Diagnosis not present

## 2023-02-09 DIAGNOSIS — E7800 Pure hypercholesterolemia, unspecified: Secondary | ICD-10-CM

## 2023-02-09 LAB — LIPID PANEL
Cholesterol: 142 mg/dL (ref 0–200)
HDL: 70.2 mg/dL (ref >39.00)
LDL Cholesterol: 57 mg/dL (ref 0–99)
NonHDL: 71.91
Total CHOL/HDL Ratio: 2
Triglycerides: 75 mg/dL (ref 0.0–149.0)
VLDL: 15 mg/dL (ref 0.0–40.0)

## 2023-02-09 LAB — COMPREHENSIVE METABOLIC PANEL
ALT: 19 U/L (ref 0–35)
AST: 21 U/L (ref 0–37)
Albumin: 4.2 g/dL (ref 3.5–5.2)
Alkaline Phosphatase: 56 U/L (ref 39–117)
BUN: 14 mg/dL (ref 6–23)
CO2: 28 mEq/L (ref 19–32)
Calcium: 9.9 mg/dL (ref 8.4–10.5)
Chloride: 105 mEq/L (ref 96–112)
Creatinine, Ser: 0.91 mg/dL (ref 0.40–1.20)
GFR: 64.03 mL/min (ref >60.00)
Glucose, Bld: 85 mg/dL (ref 70–99)
Potassium: 4.1 mEq/L (ref 3.5–5.1)
Sodium: 141 mEq/L (ref 135–145)
Total Bilirubin: 0.5 mg/dL (ref 0.2–1.2)
Total Protein: 7.3 g/dL (ref 6.0–8.3)

## 2023-02-09 LAB — CBC WITH DIFFERENTIAL/PLATELET
Basophils Absolute: 0.1 10*3/uL (ref 0.0–0.1)
Basophils Relative: 0.8 % (ref 0.0–3.0)
Eosinophils Absolute: 0.1 10*3/uL (ref 0.0–0.7)
Eosinophils Relative: 1.5 % (ref 0.0–5.0)
HCT: 40.3 % (ref 36.0–46.0)
Hemoglobin: 13.2 g/dL (ref 12.0–15.0)
Lymphocytes Relative: 26.9 % (ref 12.0–46.0)
Lymphs Abs: 2 10*3/uL (ref 0.7–4.0)
MCHC: 32.8 g/dL (ref 30.0–36.0)
MCV: 86 fl (ref 78.0–100.0)
Monocytes Absolute: 0.4 10*3/uL (ref 0.1–1.0)
Monocytes Relative: 6.2 % (ref 3.0–12.0)
Neutro Abs: 4.7 10*3/uL (ref 1.4–7.7)
Neutrophils Relative %: 64.6 % (ref 43.0–77.0)
Platelets: 343 10*3/uL (ref 150.0–400.0)
RBC: 4.68 Mil/uL (ref 3.87–5.11)
RDW: 13.6 % (ref 11.5–15.5)
WBC: 7.3 10*3/uL (ref 4.0–10.5)

## 2023-02-09 LAB — HEMOGLOBIN A1C: Hgb A1c MFr Bld: 5.4 % (ref 4.6–6.5)

## 2023-02-09 LAB — TSH: TSH: 1.79 u[IU]/mL (ref 0.35–5.50)

## 2023-02-09 NOTE — Patient Instructions (Signed)
Good to see you today . Exam is good.  Lab at  [address] Flu vaccine today . Continue weight bearing exercise and  upper body resistance exercise for bone health .

## 2023-02-10 NOTE — Progress Notes (Signed)
Blood results are good and in range  Ldl is 57 very good   below 70  Forwarding results to dr Jacques Navy

## 2023-02-17 DIAGNOSIS — J3081 Allergic rhinitis due to animal (cat) (dog) hair and dander: Secondary | ICD-10-CM | POA: Diagnosis not present

## 2023-02-17 DIAGNOSIS — J301 Allergic rhinitis due to pollen: Secondary | ICD-10-CM | POA: Diagnosis not present

## 2023-02-17 DIAGNOSIS — J3089 Other allergic rhinitis: Secondary | ICD-10-CM | POA: Diagnosis not present

## 2023-02-18 ENCOUNTER — Ambulatory Visit: Payer: Medicare HMO

## 2023-02-28 DIAGNOSIS — J301 Allergic rhinitis due to pollen: Secondary | ICD-10-CM | POA: Diagnosis not present

## 2023-02-28 DIAGNOSIS — J3081 Allergic rhinitis due to animal (cat) (dog) hair and dander: Secondary | ICD-10-CM | POA: Diagnosis not present

## 2023-02-28 DIAGNOSIS — J3089 Other allergic rhinitis: Secondary | ICD-10-CM | POA: Diagnosis not present

## 2023-03-03 DIAGNOSIS — J301 Allergic rhinitis due to pollen: Secondary | ICD-10-CM | POA: Diagnosis not present

## 2023-03-03 DIAGNOSIS — J3081 Allergic rhinitis due to animal (cat) (dog) hair and dander: Secondary | ICD-10-CM | POA: Diagnosis not present

## 2023-03-03 DIAGNOSIS — J3089 Other allergic rhinitis: Secondary | ICD-10-CM | POA: Diagnosis not present

## 2023-03-08 ENCOUNTER — Encounter: Payer: Medicare HMO | Admitting: Internal Medicine

## 2023-03-22 DIAGNOSIS — J301 Allergic rhinitis due to pollen: Secondary | ICD-10-CM | POA: Diagnosis not present

## 2023-03-22 DIAGNOSIS — J3089 Other allergic rhinitis: Secondary | ICD-10-CM | POA: Diagnosis not present

## 2023-03-22 DIAGNOSIS — J3081 Allergic rhinitis due to animal (cat) (dog) hair and dander: Secondary | ICD-10-CM | POA: Diagnosis not present

## 2023-03-31 ENCOUNTER — Encounter: Payer: Medicare HMO | Admitting: Internal Medicine

## 2023-04-06 DIAGNOSIS — J301 Allergic rhinitis due to pollen: Secondary | ICD-10-CM | POA: Diagnosis not present

## 2023-04-06 DIAGNOSIS — J3081 Allergic rhinitis due to animal (cat) (dog) hair and dander: Secondary | ICD-10-CM | POA: Diagnosis not present

## 2023-04-06 DIAGNOSIS — J3089 Other allergic rhinitis: Secondary | ICD-10-CM | POA: Diagnosis not present

## 2023-04-18 DIAGNOSIS — H5203 Hypermetropia, bilateral: Secondary | ICD-10-CM | POA: Diagnosis not present

## 2023-04-18 DIAGNOSIS — H2513 Age-related nuclear cataract, bilateral: Secondary | ICD-10-CM | POA: Diagnosis not present

## 2023-04-19 DIAGNOSIS — J301 Allergic rhinitis due to pollen: Secondary | ICD-10-CM | POA: Diagnosis not present

## 2023-04-19 DIAGNOSIS — J3089 Other allergic rhinitis: Secondary | ICD-10-CM | POA: Diagnosis not present

## 2023-04-19 DIAGNOSIS — J3081 Allergic rhinitis due to animal (cat) (dog) hair and dander: Secondary | ICD-10-CM | POA: Diagnosis not present

## 2023-04-20 ENCOUNTER — Other Ambulatory Visit: Payer: Self-pay | Admitting: Internal Medicine

## 2023-05-02 DIAGNOSIS — J3089 Other allergic rhinitis: Secondary | ICD-10-CM | POA: Diagnosis not present

## 2023-05-02 DIAGNOSIS — J301 Allergic rhinitis due to pollen: Secondary | ICD-10-CM | POA: Diagnosis not present

## 2023-05-02 DIAGNOSIS — J3081 Allergic rhinitis due to animal (cat) (dog) hair and dander: Secondary | ICD-10-CM | POA: Diagnosis not present

## 2023-05-23 DIAGNOSIS — J3089 Other allergic rhinitis: Secondary | ICD-10-CM | POA: Diagnosis not present

## 2023-05-23 DIAGNOSIS — J3081 Allergic rhinitis due to animal (cat) (dog) hair and dander: Secondary | ICD-10-CM | POA: Diagnosis not present

## 2023-05-23 DIAGNOSIS — J301 Allergic rhinitis due to pollen: Secondary | ICD-10-CM | POA: Diagnosis not present

## 2023-05-30 DIAGNOSIS — J3081 Allergic rhinitis due to animal (cat) (dog) hair and dander: Secondary | ICD-10-CM | POA: Diagnosis not present

## 2023-05-30 DIAGNOSIS — J3089 Other allergic rhinitis: Secondary | ICD-10-CM | POA: Diagnosis not present

## 2023-05-30 DIAGNOSIS — J301 Allergic rhinitis due to pollen: Secondary | ICD-10-CM | POA: Diagnosis not present

## 2023-06-06 DIAGNOSIS — J3081 Allergic rhinitis due to animal (cat) (dog) hair and dander: Secondary | ICD-10-CM | POA: Diagnosis not present

## 2023-06-06 DIAGNOSIS — J3089 Other allergic rhinitis: Secondary | ICD-10-CM | POA: Diagnosis not present

## 2023-06-06 DIAGNOSIS — J301 Allergic rhinitis due to pollen: Secondary | ICD-10-CM | POA: Diagnosis not present

## 2023-06-07 ENCOUNTER — Other Ambulatory Visit: Payer: Self-pay | Admitting: Gastroenterology

## 2023-06-13 DIAGNOSIS — J3089 Other allergic rhinitis: Secondary | ICD-10-CM | POA: Diagnosis not present

## 2023-06-13 DIAGNOSIS — J3081 Allergic rhinitis due to animal (cat) (dog) hair and dander: Secondary | ICD-10-CM | POA: Diagnosis not present

## 2023-06-13 DIAGNOSIS — J301 Allergic rhinitis due to pollen: Secondary | ICD-10-CM | POA: Diagnosis not present

## 2023-06-20 DIAGNOSIS — J301 Allergic rhinitis due to pollen: Secondary | ICD-10-CM | POA: Diagnosis not present

## 2023-06-20 DIAGNOSIS — J3089 Other allergic rhinitis: Secondary | ICD-10-CM | POA: Diagnosis not present

## 2023-06-20 DIAGNOSIS — J3081 Allergic rhinitis due to animal (cat) (dog) hair and dander: Secondary | ICD-10-CM | POA: Diagnosis not present

## 2023-07-04 DIAGNOSIS — J3089 Other allergic rhinitis: Secondary | ICD-10-CM | POA: Diagnosis not present

## 2023-07-04 DIAGNOSIS — J3081 Allergic rhinitis due to animal (cat) (dog) hair and dander: Secondary | ICD-10-CM | POA: Diagnosis not present

## 2023-07-04 DIAGNOSIS — J301 Allergic rhinitis due to pollen: Secondary | ICD-10-CM | POA: Diagnosis not present

## 2023-07-18 DIAGNOSIS — J301 Allergic rhinitis due to pollen: Secondary | ICD-10-CM | POA: Diagnosis not present

## 2023-07-18 DIAGNOSIS — J3081 Allergic rhinitis due to animal (cat) (dog) hair and dander: Secondary | ICD-10-CM | POA: Diagnosis not present

## 2023-07-18 DIAGNOSIS — J3089 Other allergic rhinitis: Secondary | ICD-10-CM | POA: Diagnosis not present

## 2023-08-01 DIAGNOSIS — J301 Allergic rhinitis due to pollen: Secondary | ICD-10-CM | POA: Diagnosis not present

## 2023-08-01 DIAGNOSIS — J3081 Allergic rhinitis due to animal (cat) (dog) hair and dander: Secondary | ICD-10-CM | POA: Diagnosis not present

## 2023-08-01 DIAGNOSIS — J3089 Other allergic rhinitis: Secondary | ICD-10-CM | POA: Diagnosis not present

## 2023-08-17 DIAGNOSIS — J3089 Other allergic rhinitis: Secondary | ICD-10-CM | POA: Diagnosis not present

## 2023-08-17 DIAGNOSIS — J3081 Allergic rhinitis due to animal (cat) (dog) hair and dander: Secondary | ICD-10-CM | POA: Diagnosis not present

## 2023-08-17 DIAGNOSIS — J301 Allergic rhinitis due to pollen: Secondary | ICD-10-CM | POA: Diagnosis not present

## 2023-08-26 ENCOUNTER — Encounter: Payer: Self-pay | Admitting: Internal Medicine

## 2023-08-26 ENCOUNTER — Ambulatory Visit: Payer: Medicare HMO | Attending: Internal Medicine | Admitting: Internal Medicine

## 2023-08-26 VITALS — BP 122/78 | HR 73 | Ht 61.5 in | Wt 151.4 lb

## 2023-08-26 DIAGNOSIS — R072 Precordial pain: Secondary | ICD-10-CM | POA: Diagnosis not present

## 2023-08-26 DIAGNOSIS — I10 Essential (primary) hypertension: Secondary | ICD-10-CM | POA: Diagnosis not present

## 2023-08-26 DIAGNOSIS — Z7189 Other specified counseling: Secondary | ICD-10-CM | POA: Diagnosis not present

## 2023-08-26 DIAGNOSIS — Z79899 Other long term (current) drug therapy: Secondary | ICD-10-CM | POA: Diagnosis not present

## 2023-08-26 DIAGNOSIS — E7800 Pure hypercholesterolemia, unspecified: Secondary | ICD-10-CM | POA: Diagnosis not present

## 2023-08-26 MED ORDER — OMEPRAZOLE 20 MG PO CPDR: 20.0000 mg | DELAYED_RELEASE_CAPSULE | Freq: Every day | ORAL | Status: DC

## 2023-08-26 MED ORDER — EZETIMIBE 10 MG PO TABS: 10.0000 mg | ORAL_TABLET | Freq: Every day | ORAL | 3 refills | Status: AC

## 2023-08-26 NOTE — Progress Notes (Signed)
  Cardiology Office Note:  .   Date:  08/26/2023  ID:  [name], DOB Nov 04, 1950, MRN [id number] PCP: Madelin Headings, MD  [facility] Providers Cardiologist:  Parke Poisson, MD    History of Present Illness: Kimberly Schroeder is a 73 y.o. female.  Discussed the use of AI scribe software for clinical note transcription with the patient, who gave verbal consent to proceed.  History of Present Illness The patient, with a history of GERD and hyperlipidemia, presents for a follow-up visit. The patient had previously experienced chest pain and elevated blood pressure, but these symptoms have not recurred in the past year. The patient's blood pressure is well controlled on amlodipine 2.5mg  daily. The patient was on low dose Crestor for hypercholesterolemia, but experienced statin myalgias and stopped taking the medication in February. The patient reports that the muscle aches went away after stopping the medication, but it took longer for the aches to subside the second time she stopped the medication. The patient's cholesterol levels were well managed while on the medication, but it is unclear what the levels are now that the patient has stopped taking the medication. The patient is physically active, walking daily, and lifts weights occasionally.    ROS: negative except per HPI above.  Studies Reviewed: Marland Kitchen   EKG Interpretation Date/Time:  Friday August 26 2023 09:16:12 EDT Ventricular Rate:  73 PR Interval:  138 QRS Duration:  68 QT Interval:  386 QTC Calculation: 425 R Axis:   -14  Text Interpretation: Normal sinus rhythm Normal ECG Confirmed by Weston Brass ([id number]) on 08/26/2023 9:24:21 AM    Results LABS LDL: 57 (01/2023) Triglycerides: 75 (01/2023) HbA1c: 5.4 (01/2023)  RADIOLOGY Coronary CTA: Normal Renal ultrasound: Normal  DIAGNOSTIC Echocardiography: Grossly normal with borderline LVH and grade one diastolic dysfunction EKG: Normal Risk  Assessment/Calculations:            Physical Exam:   VS:  BP 122/78   Pulse 73   Ht 5' 1.5" (1.562 m)   Wt 151 lb 6.4 oz (68.7 kg)   SpO2 99%   BMI 28.14 kg/m    Wt Readings from Last 3 Encounters:  08/26/23 151 lb 6.4 oz (68.7 kg)  02/09/23 152 lb (68.9 kg)  12/13/22 154 lb (69.9 kg)     Physical Exam GENERAL: Alert, cooperative, well developed, no acute distress. HEENT: Normocephalic, normal oropharynx, moist mucous membranes. CHEST: Clear to auscultation bilaterally, no wheezes, rhonchi, or crackles. CARDIOVASCULAR: Normal heart rate and rhythm, S1 and S2 normal without murmurs. ABDOMEN: Soft, non-tender, non-distended, without organomegaly, normal bowel sounds. EXTREMITIES: No cyanosis or edema. NEUROLOGICAL: Cranial nerves grossly intact, moves all extremities without gross motor or sensory deficit.   ASSESSMENT AND PLAN: .    Assessment and Plan Assessment & Plan Hyperlipidemia Medication management Hyperlipidemia previously controlled on rosuvastatin, discontinued due to myalgias. LDL expected to rise off medication. Discussed ezetimibe as a non-statin alternative with expected cholesterol reduction. - Prescribe ezetimibe 10 mg daily. - Discontinue rosuvastatin from medication list. - Monitor cholesterol levels in the fall. - Consider alternative statins like lovastatin or pivastatin if cholesterol levels rise significantly. - reviewed optimal dietary recommendations.  Hypertension Hypertension well-controlled on amlodipine 2.5 mg daily.  Precordial pain  - no recurrence  Follow-up - Schedule follow-up appointment in one year. - Instruct her to call if experiencing issues with ezetimibe or blood pressure.      Weston Brass, MD, [facility]

## 2023-08-26 NOTE — Patient Instructions (Signed)
 Medication Instructions:  Start: Ezetimibe (Zetia) 10 mg (one tablet), once daily  Lab Work: None  Follow-Up: At [facility], you and your health needs are our priority.  As part of our continuing mission to provide you with exceptional heart care, our providers are all part of one team.  This team includes your primary Cardiologist (physician) and Advanced Practice Providers or APPs (Physician Assistants and Nurse Practitioners) who all work together to provide you with the care you need, when you need it.  Your next appointment:   1 year(s)  Provider:   Parke Poisson, MD    Other Instructions Please call us or send a MyChart message with any Cardiology related questions/concerns.  [phone].  Thank you!        Valet parking services will be available as well.

## 2023-08-30 ENCOUNTER — Other Ambulatory Visit: Payer: Self-pay | Admitting: Gastroenterology

## 2023-08-30 DIAGNOSIS — J3089 Other allergic rhinitis: Secondary | ICD-10-CM | POA: Diagnosis not present

## 2023-08-30 DIAGNOSIS — J3081 Allergic rhinitis due to animal (cat) (dog) hair and dander: Secondary | ICD-10-CM | POA: Diagnosis not present

## 2023-08-30 DIAGNOSIS — J301 Allergic rhinitis due to pollen: Secondary | ICD-10-CM | POA: Diagnosis not present

## 2023-09-15 DIAGNOSIS — J3089 Other allergic rhinitis: Secondary | ICD-10-CM | POA: Diagnosis not present

## 2023-09-15 DIAGNOSIS — J3081 Allergic rhinitis due to animal (cat) (dog) hair and dander: Secondary | ICD-10-CM | POA: Diagnosis not present

## 2023-09-15 DIAGNOSIS — J301 Allergic rhinitis due to pollen: Secondary | ICD-10-CM | POA: Diagnosis not present

## 2023-09-22 DIAGNOSIS — J3081 Allergic rhinitis due to animal (cat) (dog) hair and dander: Secondary | ICD-10-CM | POA: Diagnosis not present

## 2023-09-22 DIAGNOSIS — J301 Allergic rhinitis due to pollen: Secondary | ICD-10-CM | POA: Diagnosis not present

## 2023-09-22 DIAGNOSIS — J3089 Other allergic rhinitis: Secondary | ICD-10-CM | POA: Diagnosis not present

## 2023-10-03 DIAGNOSIS — D2262 Melanocytic nevi of left upper limb, including shoulder: Secondary | ICD-10-CM | POA: Diagnosis not present

## 2023-10-03 DIAGNOSIS — L821 Other seborrheic keratosis: Secondary | ICD-10-CM | POA: Diagnosis not present

## 2023-10-03 DIAGNOSIS — D2272 Melanocytic nevi of left lower limb, including hip: Secondary | ICD-10-CM | POA: Diagnosis not present

## 2023-10-03 DIAGNOSIS — L918 Other hypertrophic disorders of the skin: Secondary | ICD-10-CM | POA: Diagnosis not present

## 2023-10-03 DIAGNOSIS — D225 Melanocytic nevi of trunk: Secondary | ICD-10-CM | POA: Diagnosis not present

## 2023-10-03 DIAGNOSIS — L738 Other specified follicular disorders: Secondary | ICD-10-CM | POA: Diagnosis not present

## 2023-10-03 DIAGNOSIS — L720 Epidermal cyst: Secondary | ICD-10-CM | POA: Diagnosis not present

## 2023-10-03 DIAGNOSIS — D2271 Melanocytic nevi of right lower limb, including hip: Secondary | ICD-10-CM | POA: Diagnosis not present

## 2023-10-03 DIAGNOSIS — D224 Melanocytic nevi of scalp and neck: Secondary | ICD-10-CM | POA: Diagnosis not present

## 2023-10-03 DIAGNOSIS — D2261 Melanocytic nevi of right upper limb, including shoulder: Secondary | ICD-10-CM | POA: Diagnosis not present

## 2023-10-04 DIAGNOSIS — J3081 Allergic rhinitis due to animal (cat) (dog) hair and dander: Secondary | ICD-10-CM | POA: Diagnosis not present

## 2023-10-04 DIAGNOSIS — J301 Allergic rhinitis due to pollen: Secondary | ICD-10-CM | POA: Diagnosis not present

## 2023-10-04 DIAGNOSIS — J3089 Other allergic rhinitis: Secondary | ICD-10-CM | POA: Diagnosis not present

## 2023-10-12 LAB — HM MAMMOGRAPHY

## 2023-10-13 LAB — HM PAP SMEAR

## 2023-10-20 DIAGNOSIS — J301 Allergic rhinitis due to pollen: Secondary | ICD-10-CM | POA: Diagnosis not present

## 2023-10-20 DIAGNOSIS — J3081 Allergic rhinitis due to animal (cat) (dog) hair and dander: Secondary | ICD-10-CM | POA: Diagnosis not present

## 2023-10-20 DIAGNOSIS — J3089 Other allergic rhinitis: Secondary | ICD-10-CM | POA: Diagnosis not present

## 2023-11-03 DIAGNOSIS — J3089 Other allergic rhinitis: Secondary | ICD-10-CM | POA: Diagnosis not present

## 2023-11-03 DIAGNOSIS — J301 Allergic rhinitis due to pollen: Secondary | ICD-10-CM | POA: Diagnosis not present

## 2023-11-03 DIAGNOSIS — J3081 Allergic rhinitis due to animal (cat) (dog) hair and dander: Secondary | ICD-10-CM | POA: Diagnosis not present

## 2023-11-14 DIAGNOSIS — J3081 Allergic rhinitis due to animal (cat) (dog) hair and dander: Secondary | ICD-10-CM | POA: Diagnosis not present

## 2023-11-14 DIAGNOSIS — J3089 Other allergic rhinitis: Secondary | ICD-10-CM | POA: Diagnosis not present

## 2023-11-14 DIAGNOSIS — J301 Allergic rhinitis due to pollen: Secondary | ICD-10-CM | POA: Diagnosis not present

## 2023-12-01 DIAGNOSIS — J3081 Allergic rhinitis due to animal (cat) (dog) hair and dander: Secondary | ICD-10-CM | POA: Diagnosis not present

## 2023-12-01 DIAGNOSIS — J3089 Other allergic rhinitis: Secondary | ICD-10-CM | POA: Diagnosis not present

## 2023-12-01 DIAGNOSIS — J301 Allergic rhinitis due to pollen: Secondary | ICD-10-CM | POA: Diagnosis not present

## 2023-12-07 ENCOUNTER — Other Ambulatory Visit: Payer: Self-pay | Admitting: Internal Medicine

## 2023-12-15 DIAGNOSIS — J3089 Other allergic rhinitis: Secondary | ICD-10-CM | POA: Diagnosis not present

## 2023-12-15 DIAGNOSIS — J301 Allergic rhinitis due to pollen: Secondary | ICD-10-CM | POA: Diagnosis not present

## 2023-12-15 DIAGNOSIS — J3081 Allergic rhinitis due to animal (cat) (dog) hair and dander: Secondary | ICD-10-CM | POA: Diagnosis not present

## 2023-12-29 DIAGNOSIS — J301 Allergic rhinitis due to pollen: Secondary | ICD-10-CM | POA: Diagnosis not present

## 2023-12-29 DIAGNOSIS — J3089 Other allergic rhinitis: Secondary | ICD-10-CM | POA: Diagnosis not present

## 2023-12-29 DIAGNOSIS — J3081 Allergic rhinitis due to animal (cat) (dog) hair and dander: Secondary | ICD-10-CM | POA: Diagnosis not present

## 2024-01-10 DIAGNOSIS — J301 Allergic rhinitis due to pollen: Secondary | ICD-10-CM | POA: Diagnosis not present

## 2024-01-10 DIAGNOSIS — J3089 Other allergic rhinitis: Secondary | ICD-10-CM | POA: Diagnosis not present

## 2024-01-10 DIAGNOSIS — J3081 Allergic rhinitis due to animal (cat) (dog) hair and dander: Secondary | ICD-10-CM | POA: Diagnosis not present

## 2024-01-18 DIAGNOSIS — J3081 Allergic rhinitis due to animal (cat) (dog) hair and dander: Secondary | ICD-10-CM | POA: Diagnosis not present

## 2024-01-18 DIAGNOSIS — J301 Allergic rhinitis due to pollen: Secondary | ICD-10-CM | POA: Diagnosis not present

## 2024-01-18 DIAGNOSIS — J3089 Other allergic rhinitis: Secondary | ICD-10-CM | POA: Diagnosis not present

## 2024-01-25 DIAGNOSIS — J3089 Other allergic rhinitis: Secondary | ICD-10-CM | POA: Diagnosis not present

## 2024-01-25 DIAGNOSIS — J301 Allergic rhinitis due to pollen: Secondary | ICD-10-CM | POA: Diagnosis not present

## 2024-01-25 DIAGNOSIS — J3081 Allergic rhinitis due to animal (cat) (dog) hair and dander: Secondary | ICD-10-CM | POA: Diagnosis not present

## 2024-01-31 DIAGNOSIS — J3081 Allergic rhinitis due to animal (cat) (dog) hair and dander: Secondary | ICD-10-CM | POA: Diagnosis not present

## 2024-01-31 DIAGNOSIS — J3089 Other allergic rhinitis: Secondary | ICD-10-CM | POA: Diagnosis not present

## 2024-01-31 DIAGNOSIS — J301 Allergic rhinitis due to pollen: Secondary | ICD-10-CM | POA: Diagnosis not present

## 2024-02-14 NOTE — Progress Notes (Unsigned)
 No chief complaint on file.   HPI: Patient  Kimberly Schroeder  73 y.o. comes in today for Preventive Health Care visit   HT amlodipine  GI  CV  Health Maintenance  Topic Date Due   Diabetic kidney evaluation - Urine ACR  Never done   Mammogram  10/07/2023   Medicare Annual Wellness (AWV)  12/13/2023   Influenza Vaccine  12/16/2023   COVID-19 Vaccine (7 - 2025-26 season) 01/16/2024   Diabetic kidney evaluation - eGFR measurement  02/09/2024   DTaP/Tdap/Td (3 - Td or Tdap) 03/30/2027   Colonoscopy  06/16/2027   Pneumococcal Vaccine: 50+ Years  Completed   DEXA SCAN  Completed   Zoster Vaccines- Shingrix  Completed   HPV VACCINES  Aged Out   Meningococcal B Vaccine  Aged Out   Health Maintenance Review LIFESTYLE:  Exercise:   Tobacco/ETS: Alcohol:  Sugar beverages: Sleep: Drug use: no HH of  Work:    ROS:  GEN/ HEENT: No fever, significant weight changes sweats headaches vision problems hearing changes, CV/ PULM; No chest pain shortness of breath cough, syncope,edema  change in exercise tolerance. GI /GU: No adominal pain, vomiting, change in bowel habits. No blood in the stool. No significant GU symptoms. SKIN/HEME: ,no acute skin rashes suspicious lesions or bleeding. No lymphadenopathy, nodules, masses.  NEURO/ PSYCH:  No neurologic signs such as weakness numbness. No depression anxiety. IMM/ Allergy: No unusual infections.  Allergy .   REST of 12 system review negative except as per HPI   Past Medical History:  Diagnosis Date   Allergic rhinitis    allergist evaluation spring fall and dust.   Ectopic pregnancy    x 2   GERD (gastroesophageal reflux disease)    Hematuria, microscopic    eval 2001  Dr Andra    Past Surgical History:  Procedure Laterality Date   LAPAROSCOPY     ectopic pregnancy   LAPAROSCOPY FOR ECTOPIC PREGNANCY     x2    Family History  Problem Relation Age of Onset   Colon cancer Father 36   Colon cancer Paternal Uncle         100's   Parkinsonism Other    Arthritis Other    Hypertension Other    Pancreatic cancer Neg Hx    Prostate cancer Neg Hx    Rectal cancer Neg Hx    Stomach cancer Neg Hx    Esophageal cancer Neg Hx     Social History   Socioeconomic History   Marital status: Married    Spouse name: Not on file   Number of children: 0   Years of education: Not on file   Highest education level: Bachelor's degree (e.g., BA, AB, BS)  Occupational History   Occupation: retired  Tobacco Use   Smoking status: Never   Smokeless tobacco: Never  Vaping Use   Vaping status: Never Used  Substance and Sexual Activity   Alcohol use: Yes    Comment: occasional wine   Drug use: No   Sexual activity: Not on file  Other Topics Concern   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  Social Drivers of Corporate investment banker Strain: Low Risk  (02/11/2024)   Overall Financial Resource Strain (CARDIA)    Difficulty of Paying Living Expenses: Not hard at all  Food Insecurity: No Food Insecurity (02/11/2024)   Hunger Vital Sign    Worried About Running Out of Food in the Last Year: Never true    Ran Out of Food in the Last Year: Never true  Transportation Needs: No Transportation Needs (02/11/2024)   PRAPARE - Administrator, Civil Service (Medical): No    Lack of Transportation (Non-Medical): No  Physical Activity: Sufficiently Active (02/11/2024)   Exercise Vital Sign    Days of Exercise per Week: 6 days    Minutes of Exercise per Session: 50 min  Stress: No Stress Concern Present (02/11/2024)   [employer] of Occupational Health - Occupational Stress Questionnaire    Feeling of Stress: Only a little  Social Connections:  Socially Integrated (02/11/2024)   Social Connection and Isolation Panel    Frequency of Communication with Friends and Family: Once a week    Frequency of Social Gatherings with Friends and Family: Twice a week    Attends Religious Services: More than 4 times per year    Active Member of [employer] or Organizations: Yes    Attends Engineer, structural: More than 4 times per year    Marital Status: Married    Outpatient Medications Prior to Visit  Medication Sig Dispense Refill   amLODipine  (NORVASC ) 2.5 MG tablet TAKE 1 TABLET BY MOUTH EVERY DAY FOR BLOOD PRESSURE 90 tablet 2   cetirizine (ZYRTEC) 10 MG tablet Take 10 mg by mouth daily.     Cholecalciferol (VITAMIN D3 MAXIMUM STRENGTH) 125 MCG (5000 UT) capsule Take 5,000 Units by mouth in the morning and at bedtime. With vitamin K and minerals     EPIPEN 2-PAK 0.3 MG/0.3ML SOAJ injection   1   Estradiol 10 MCG TABS vaginal tablet Place 1 each vaginally once a week.     ezetimibe  (ZETIA ) 10 MG tablet Take 1 tablet (10 mg total) by mouth daily. 90 tablet 3   fish oil-omega-3 fatty acids 1000 MG capsule Take 2 g by mouth daily.     METAMUCIL MULTIHEALTH FIBER 63 % POWD Take by mouth daily in the afternoon.     omeprazole  (PRILOSEC) 20 MG capsule TAKE 1 CAPSULE (20 MG TOTAL) BY MOUTH 2 (TWO) TIMES DAILY BEFORE A MEAL. 180 capsule 3   No facility-administered medications prior to visit.     EXAM:  There were no vitals taken for this visit.  There is no height or weight on file to calculate BMI. Wt Readings from Last 3 Encounters:  08/26/23 151 lb 6.4 oz (68.7 kg)  02/09/23 152 lb (68.9 kg)  12/13/22 154 lb (69.9 kg)   BP Readings from Last 3 Encounters:  08/26/23 122/78  02/09/23 124/82  08/18/22 132/78    Physical Exam: Vital signs reviewed HZW:Uypd is a well-developed well-nourished alert cooperative    who appearsr stated age in no acute distress.  HEENT: normocephalic atraumatic , Eyes: PERRL EOM's full, conjunctiva  clear, Nares: paten,t no deformity discharge or tenderness., Ears: no deformity EAC's clear TMs with normal landmarks. Mouth: clear OP, no lesions, edema.  Moist mucous membranes. Dentition in adequate repair. NECK: supple without masses, thyromegaly or bruits. CHEST/PULM:  Clear to auscultation and percussion breath sounds equal no wheeze , rales or rhonchi. No chest wall deformities or tenderness. Breast:  normal by inspection . No dimpling, discharge, masses, tenderness or discharge . CV: PMI is nondisplaced, S1 S2 no gallops, murmurs, rubs. Peripheral pulses are full without delay.No JVD .  ABDOMEN: Bowel sounds normal nontender  No guard or rebound, no hepato splenomegal no CVA tenderness.  No hernia. Extremtities:  No clubbing cyanosis or edema, no acute joint swelling or redness no focal atrophy NEURO:  Oriented x3, cranial nerves 3-12 appear to be intact, no obvious focal weakness,gait within normal limits no abnormal reflexes or asymmetrical SKIN: No acute rashes normal turgor, color, no bruising or petechiae. PSYCH: Oriented, good eye contact, no obvious depression anxiety, cognition and judgment appear normal. LN: no cervical axillary inguinal adenopathy  Lab Results  Component Value Date   WBC 7.3 02/09/2023   HGB 13.2 02/09/2023   HCT 40.3 02/09/2023   PLT 343.0 02/09/2023   GLUCOSE 85 02/09/2023   CHOL 142 02/09/2023   TRIG 75.0 02/09/2023   HDL 70.20 02/09/2023   LDLDIRECT 132.6 04/16/2011   LDLCALC 57 02/09/2023   ALT 19 02/09/2023   AST 21 02/09/2023   NA 141 02/09/2023   K 4.1 02/09/2023   CL 105 02/09/2023   CREATININE 0.91 02/09/2023   BUN 14 02/09/2023   CO2 28 02/09/2023   TSH 1.79 02/09/2023   HGBA1C 5.4 02/09/2023    BP Readings from Last 3 Encounters:  08/26/23 122/78  02/09/23 124/82  08/18/22 132/78    Lab plan   ASSESSMENT AND PLAN:  Discussed the following assessment and plan:  No diagnosis found. No follow-ups on file.  Patient Care  Team: Gessica Jawad, Apolinar POUR, MD as PCP - General Loni Soyla LABOR, MD as PCP - Cardiology (Cardiology) Frutoso Luz, MD (Allergy) Mat Browning, MD (Obstetrics and Gynecology) Nandigam, Kavitha V, MD as Consulting Physician (Gastroenterology) [place], Amy, MD as Consulting Physician (Dermatology) There are no Patient Instructions on file for this visit.  Chinelo Benn K. Ricard Faulkner M.D.

## 2024-02-15 ENCOUNTER — Ambulatory Visit: Admitting: Internal Medicine

## 2024-02-15 ENCOUNTER — Encounter: Payer: Self-pay | Admitting: Internal Medicine

## 2024-02-15 VITALS — BP 124/78 | HR 73 | Temp 98.2°F | Ht 61.46 in | Wt 151.4 lb

## 2024-02-15 DIAGNOSIS — Z79899 Other long term (current) drug therapy: Secondary | ICD-10-CM

## 2024-02-15 DIAGNOSIS — I10 Essential (primary) hypertension: Secondary | ICD-10-CM

## 2024-02-15 DIAGNOSIS — J3081 Allergic rhinitis due to animal (cat) (dog) hair and dander: Secondary | ICD-10-CM | POA: Diagnosis not present

## 2024-02-15 DIAGNOSIS — Z0000 Encounter for general adult medical examination without abnormal findings: Secondary | ICD-10-CM | POA: Diagnosis not present

## 2024-02-15 DIAGNOSIS — E7800 Pure hypercholesterolemia, unspecified: Secondary | ICD-10-CM | POA: Diagnosis not present

## 2024-02-15 DIAGNOSIS — Z23 Encounter for immunization: Secondary | ICD-10-CM | POA: Diagnosis not present

## 2024-02-15 DIAGNOSIS — E785 Hyperlipidemia, unspecified: Secondary | ICD-10-CM

## 2024-02-15 DIAGNOSIS — K219 Gastro-esophageal reflux disease without esophagitis: Secondary | ICD-10-CM

## 2024-02-15 DIAGNOSIS — J3089 Other allergic rhinitis: Secondary | ICD-10-CM | POA: Diagnosis not present

## 2024-02-15 DIAGNOSIS — J301 Allergic rhinitis due to pollen: Secondary | ICD-10-CM | POA: Diagnosis not present

## 2024-02-15 NOTE — Patient Instructions (Signed)
 Plan yearly fu  Continue lifestyle intervention healthy eating and exercise .  Resistance exercises activities .   Lab today and will share with cards team.

## 2024-02-16 ENCOUNTER — Encounter: Payer: Self-pay | Admitting: Internal Medicine

## 2024-02-16 ENCOUNTER — Ambulatory Visit: Payer: Self-pay | Admitting: Internal Medicine

## 2024-02-16 DIAGNOSIS — I10 Essential (primary) hypertension: Secondary | ICD-10-CM

## 2024-02-16 DIAGNOSIS — Z79899 Other long term (current) drug therapy: Secondary | ICD-10-CM

## 2024-02-16 LAB — COMPREHENSIVE METABOLIC PANEL WITH GFR
ALT: 16 U/L (ref 0–35)
AST: 23 U/L (ref 0–37)
Albumin: 4.2 g/dL (ref 3.5–5.2)
Alkaline Phosphatase: 54 U/L (ref 39–117)
BUN: 13 mg/dL (ref 6–23)
CO2: 27 meq/L (ref 19–32)
Calcium: 9.6 mg/dL (ref 8.4–10.5)
Chloride: 106 meq/L (ref 96–112)
Creatinine, Ser: 0.86 mg/dL (ref 0.40–1.20)
GFR: 68.04 mL/min (ref >60.00)
Glucose, Bld: 91 mg/dL (ref 70–99)
Potassium: 4.4 meq/L (ref 3.5–5.1)
Sodium: 141 meq/L (ref 135–145)
Total Bilirubin: 0.4 mg/dL (ref 0.2–1.2)
Total Protein: 7.4 g/dL (ref 6.0–8.3)

## 2024-02-16 LAB — LIPID PANEL
Cholesterol: 189 mg/dL (ref 0–200)
HDL: 66.9 mg/dL (ref >39.00)
LDL Cholesterol: 102 mg/dL — ABNORMAL HIGH (ref 0–99)
NonHDL: 121.85
Total CHOL/HDL Ratio: 3
Triglycerides: 97 mg/dL (ref 0.0–149.0)
VLDL: 19.4 mg/dL (ref 0.0–40.0)

## 2024-02-16 LAB — CBC WITH DIFFERENTIAL/PLATELET
Basophils Absolute: 0 K/uL (ref 0.0–0.1)
Basophils Relative: 0.9 % (ref 0.0–3.0)
Eosinophils Absolute: 0.2 K/uL (ref 0.0–0.7)
Eosinophils Relative: 3.9 % (ref 0.0–5.0)
HCT: 38 % (ref 36.0–46.0)
Hemoglobin: 12.5 g/dL (ref 12.0–15.0)
Lymphocytes Relative: 38.9 % (ref 12.0–46.0)
Lymphs Abs: 1.8 K/uL (ref 0.7–4.0)
MCHC: 32.8 g/dL (ref 30.0–36.0)
MCV: 80.7 fl (ref 78.0–100.0)
Monocytes Absolute: 0.3 K/uL (ref 0.1–1.0)
Monocytes Relative: 7.2 % (ref 3.0–12.0)
Neutro Abs: 2.2 K/uL (ref 1.4–7.7)
Neutrophils Relative %: 49.1 % (ref 43.0–77.0)
Platelets: 396 K/uL (ref 150.0–400.0)
RBC: 4.7 Mil/uL (ref 3.87–5.11)
RDW: 16.7 % — ABNORMAL HIGH (ref 11.5–15.5)
WBC: 4.5 K/uL (ref 4.0–10.5)

## 2024-02-16 LAB — HEMOGLOBIN A1C: Hgb A1c MFr Bld: 5.8 % (ref 4.6–6.5)

## 2024-02-16 LAB — MICROALBUMIN / CREATININE URINE RATIO
Creatinine,U: 186 mg/dL
Microalb Creat Ratio: 59.8 mg/g — ABNORMAL HIGH (ref 0.0–30.0)
Microalb, Ur: 11.1 mg/dL — ABNORMAL HIGH (ref 0.0–1.9)

## 2024-02-16 LAB — TSH: TSH: 1.79 u[IU]/mL (ref 0.35–5.50)

## 2024-02-16 NOTE — Telephone Encounter (Signed)
 Copied from CRM [id number]. Topic: Clinical - Request for Lab/Test Order >> Feb 16, 2024  1:21 PM Lauren C wrote: Reason for CRM: Per telephone encounter with Dr. Charlett, Please arrange another urine specimen urin microalbumin cr ratio when well hydrated. Pt is scheduled for 10/6 for lab follow up. Please make sure orders are in chart for this visit.

## 2024-02-16 NOTE — Progress Notes (Signed)
 So ldl up from baseline but prob since off the statin .  Slight increase in protein in urine  may not be significant but needs  a fu specimen.  Rest of labs ok or in range . Please arrange another urine specimen urin microalbumin cr ratio  when well hydrated  .  Forwarding  results to dr Loni

## 2024-02-20 ENCOUNTER — Other Ambulatory Visit (INDEPENDENT_AMBULATORY_CARE_PROVIDER_SITE_OTHER)

## 2024-02-20 DIAGNOSIS — I10 Essential (primary) hypertension: Secondary | ICD-10-CM

## 2024-02-20 DIAGNOSIS — Z79899 Other long term (current) drug therapy: Secondary | ICD-10-CM | POA: Diagnosis not present

## 2024-02-21 LAB — MICROALBUMIN / CREATININE URINE RATIO
Creatinine,U: 10.7 mg/dL
Microalb Creat Ratio: 152.8 mg/g — ABNORMAL HIGH (ref 0.0–30.0)
Microalb, Ur: 1.6 mg/dL (ref 0.0–1.9)

## 2024-02-29 DIAGNOSIS — J3089 Other allergic rhinitis: Secondary | ICD-10-CM | POA: Diagnosis not present

## 2024-02-29 DIAGNOSIS — J301 Allergic rhinitis due to pollen: Secondary | ICD-10-CM | POA: Diagnosis not present

## 2024-02-29 DIAGNOSIS — J3081 Allergic rhinitis due to animal (cat) (dog) hair and dander: Secondary | ICD-10-CM | POA: Diagnosis not present

## 2024-03-08 DIAGNOSIS — J3089 Other allergic rhinitis: Secondary | ICD-10-CM | POA: Diagnosis not present

## 2024-03-08 DIAGNOSIS — J3081 Allergic rhinitis due to animal (cat) (dog) hair and dander: Secondary | ICD-10-CM | POA: Diagnosis not present

## 2024-03-08 DIAGNOSIS — J301 Allergic rhinitis due to pollen: Secondary | ICD-10-CM | POA: Diagnosis not present

## 2024-03-22 DIAGNOSIS — J3089 Other allergic rhinitis: Secondary | ICD-10-CM | POA: Diagnosis not present

## 2024-03-22 DIAGNOSIS — J3081 Allergic rhinitis due to animal (cat) (dog) hair and dander: Secondary | ICD-10-CM | POA: Diagnosis not present

## 2024-03-22 DIAGNOSIS — J301 Allergic rhinitis due to pollen: Secondary | ICD-10-CM | POA: Diagnosis not present

## 2024-03-29 DIAGNOSIS — J3081 Allergic rhinitis due to animal (cat) (dog) hair and dander: Secondary | ICD-10-CM | POA: Diagnosis not present

## 2024-03-29 DIAGNOSIS — J301 Allergic rhinitis due to pollen: Secondary | ICD-10-CM | POA: Diagnosis not present

## 2024-03-29 DIAGNOSIS — J3089 Other allergic rhinitis: Secondary | ICD-10-CM | POA: Diagnosis not present

## 2024-04-05 DIAGNOSIS — J301 Allergic rhinitis due to pollen: Secondary | ICD-10-CM | POA: Diagnosis not present

## 2024-04-05 DIAGNOSIS — J3089 Other allergic rhinitis: Secondary | ICD-10-CM | POA: Diagnosis not present

## 2024-04-05 DIAGNOSIS — J3081 Allergic rhinitis due to animal (cat) (dog) hair and dander: Secondary | ICD-10-CM | POA: Diagnosis not present

## 2024-04-11 DIAGNOSIS — J3081 Allergic rhinitis due to animal (cat) (dog) hair and dander: Secondary | ICD-10-CM | POA: Diagnosis not present

## 2024-04-11 DIAGNOSIS — J301 Allergic rhinitis due to pollen: Secondary | ICD-10-CM | POA: Diagnosis not present

## 2024-04-11 DIAGNOSIS — J3089 Other allergic rhinitis: Secondary | ICD-10-CM | POA: Diagnosis not present

## 2024-04-24 DIAGNOSIS — H2513 Age-related nuclear cataract, bilateral: Secondary | ICD-10-CM | POA: Diagnosis not present

## 2024-04-24 DIAGNOSIS — H5203 Hypermetropia, bilateral: Secondary | ICD-10-CM | POA: Diagnosis not present

## 2024-04-25 ENCOUNTER — Ambulatory Visit

## 2024-04-25 VITALS — Ht 62.0 in | Wt 149.0 lb

## 2024-04-25 DIAGNOSIS — Z0000 Encounter for general adult medical examination without abnormal findings: Secondary | ICD-10-CM | POA: Diagnosis not present

## 2024-04-25 NOTE — Patient Instructions (Addendum)
 Kimberly Schroeder,  Thank you for taking the time for your Medicare Wellness Visit. I appreciate your continued commitment to your health goals. Please review the care plan we discussed, and feel free to reach out if I can assist you further.  Please note that Annual Wellness Visits do not include a physical exam. Some assessments may be limited, especially if the visit was conducted virtually. If needed, we may recommend an in-person follow-up with your provider.  Ongoing Care Seeing your primary care provider every 3 to 6 months helps us  monitor your health and provide consistent, personalized care.   Referrals If a referral was made during today's visit and you haven't received any updates within two weeks, please contact the referred provider directly to check on the status.  Recommended Screenings:  Health Maintenance  Topic Date Due   COVID-19 Vaccine (7 - 2025-26 season) 01/16/2024   Breast Cancer Screening  10/11/2024   Yearly kidney function blood test for diabetes  02/14/2025   Yearly kidney health urinalysis for diabetes  02/19/2025   Medicare Annual Wellness Visit  04/25/2025   DTaP/Tdap/Td vaccine (3 - Td or Tdap) 03/30/2027   Colon Cancer Screening  06/16/2027   Pneumococcal Vaccine for age over 61  Completed   Flu Shot  Completed   Osteoporosis screening with Bone Density Scan  Completed   Zoster (Shingles) Vaccine  Completed   Meningitis B Vaccine  Aged Out       04/25/2024    2:27 PM  Advanced Directives  Does Patient Have a Medical Advance Directive? Yes  Type of Estate Agent of [place];Living will  Does patient want to make changes to medical advance directive? No - Patient declined  Copy of Healthcare Power of Attorney in Chart? No - copy requested    Vision: Annual vision screenings are recommended for early detection of glaucoma, cataracts, and diabetic retinopathy. These exams can also reveal signs of chronic conditions such as diabetes  and high blood pressure.  Dental: Annual dental screenings help detect early signs of oral cancer, gum disease, and other conditions linked to overall health, including heart disease and diabetes.  Please see the attached documents for additional preventive care recommendations.

## 2024-04-25 NOTE — Progress Notes (Signed)
 Chief Complaint  Patient presents with   Medicare Wellness     Subjective:   Kimberly Schroeder is a 73 y.o. female who presents for a Medicare Annual Wellness Visit.  Visit info / Clinical Intake: Medicare Wellness Visit Type:: Subsequent Annual Wellness Visit Persons participating in visit and providing information:: patient Medicare Wellness Visit Mode:: Telephone If telephone:: video declined Since this visit was completed virtually, some vitals may be partially provided or unavailable. Missing vitals are due to the limitations of the virtual format.: Documented vitals are patient reported If Telephone or Video please confirm:: I connected with patient using audio/video enable telemedicine. I verified patient identity with two identifiers, discussed telehealth limitations, and patient agreed to proceed. Patient Location:: Home Provider Location:: Office Interpreter Needed?: No Pre-visit prep was completed: yes AWV questionnaire completed by patient prior to visit?: yes Living arrangements:: lives with spouse/significant other Patient's Overall Health Status Rating: excellent Typical amount of pain: some Does pain affect daily life?: no Are you currently prescribed opioids?: no  Dietary Habits and Nutritional Risks How many meals a day?: 3 Eats fruit and vegetables daily?: yes Most meals are obtained by: preparing own meals In the last 2 weeks, have you had any of the following?: none Diabetic:: no  Functional Status Activities of Daily Living (to include ambulation/medication): Independent Ambulation: Independent with device- listed below Home Assistive Devices/Equipment: Eyeglasses Medication Administration: Independent Home Management (perform basic housework or laundry): Independent Manage your own finances?: yes Primary transportation is: driving Concerns about vision?: no *vision screening is required for WTM* Concerns about hearing?: no  Fall Screening Falls  in the past year?: 0 Number of falls in past year: 0 Was there an injury with Fall?: 0 Fall Risk Category Calculator: 0 Patient Fall Risk Level: Low Fall Risk  Fall Risk Patient at Risk for Falls Due to: No Fall Risks Fall risk Follow up: Falls evaluation completed  Home and Transportation Safety: All rugs have non-skid backing?: yes All stairs or steps have railings?: yes Grab bars in the bathtub or shower?: yes Have non-skid surface in bathtub or shower?: yes Good home lighting?: yes Regular seat belt use?: yes Hospital stays in the last year:: no  Cognitive Assessment Difficulty concentrating, remembering, or making decisions? : no Will 6CIT or Mini Cog be Completed: yes 6CIT or Mini Cog Declined: patient alert, oriented, able to answer questions appropriately and recall recent events What year is it?: 0 points What month is it?: 0 points Give patient an address phrase to remember (5 components): [address] [place]  About what time is it?: 0 points Count backwards from 20 to 1: 0 points Say the months of the year in reverse: 0 points Repeat the address phrase from earlier: 0 points 6 CIT Score: 0 points  Advance Directives (For Healthcare) Does Patient Have a Medical Advance Directive?: Yes Does patient want to make changes to medical advance directive?: No - Patient declined Type of Advance Directive: Healthcare Power of [place]; Living will Copy of Healthcare Power of Attorney in Chart?: No - copy requested Copy of Living Will in Chart?: No - copy requested  Reviewed/Updated  Reviewed/Updated: Reviewed All (Medical, Surgical, Family, Medications, Allergies, Care Teams, Patient Goals)    Allergies (verified) Iohexol    Current Medications (verified) Outpatient Encounter Medications as of 04/25/2024  Medication Sig   amLODipine  (NORVASC ) 2.5 MG tablet TAKE 1 TABLET BY MOUTH EVERY DAY FOR BLOOD PRESSURE   cetirizine (ZYRTEC) 10 MG tablet Take 10 mg by  mouth daily.   Cholecalciferol (VITAMIN D3 MAXIMUM STRENGTH) 125 MCG (5000 UT) capsule Take 5,000 Units by mouth in the morning and at bedtime. With vitamin K and minerals   EPIPEN 2-PAK 0.3 MG/0.3ML SOAJ injection    Estradiol 10 MCG TABS vaginal tablet Place 1 each vaginally once a week.   ezetimibe  (ZETIA ) 10 MG tablet Take 1 tablet (10 mg total) by mouth daily.   fish oil-omega-3 fatty acids 1000 MG capsule Take 2 g by mouth daily.   METAMUCIL MULTIHEALTH FIBER 63 % POWD Take by mouth daily in the afternoon.   omeprazole  (PRILOSEC) 20 MG capsule TAKE 1 CAPSULE (20 MG TOTAL) BY MOUTH 2 (TWO) TIMES DAILY BEFORE A MEAL. (Patient taking differently: Take 20 mg by mouth daily.)   No facility-administered encounter medications on file as of 04/25/2024.    History: Past Medical History:  Diagnosis Date   Allergic rhinitis    allergist evaluation spring fall and dust.   Ectopic pregnancy    x 2   GERD (gastroesophageal reflux disease)    Hematuria, microscopic    eval 2001  Dr Andra   Past Surgical History:  Procedure Laterality Date   LAPAROSCOPY     ectopic pregnancy   LAPAROSCOPY FOR ECTOPIC PREGNANCY     x2   Family History  Problem Relation Age of Onset   Colon cancer Father 26   Colon cancer Paternal Uncle        26's   Parkinsonism Other    Arthritis Other    Hypertension Other    Pancreatic cancer Neg Hx    Prostate cancer Neg Hx    Rectal cancer Neg Hx    Stomach cancer Neg Hx    Esophageal cancer Neg Hx    Social History   Occupational History   Occupation: retired  Tobacco Use   Smoking status: Never   Smokeless tobacco: Never  Vaping Use   Vaping status: Never Used  Substance and Sexual Activity   Alcohol use: Yes    Comment: occasional wine   Drug use: No   Sexual activity: Not on file   Tobacco Counseling Counseling given: No  SDOH Screenings   Food Insecurity: No Food Insecurity (04/25/2024)  Housing: Unknown (04/25/2024)  Transportation  Needs: No Transportation Needs (04/25/2024)  Utilities: Not At Risk (04/25/2024)  Alcohol Screen: Low Risk  (04/21/2024)  Depression (PHQ2-9): Low Risk  (04/25/2024)  Financial Resource Strain: Low Risk  (04/21/2024)  Physical Activity: Sufficiently Active (04/25/2024)  Social Connections: Socially Integrated (04/25/2024)  Stress: No Stress Concern Present (04/25/2024)  Tobacco Use: Low Risk  (04/25/2024)  Health Literacy: Adequate Health Literacy (04/25/2024)   See flowsheets for full screening details  Depression Screen PHQ 2 & 9 Depression Scale- Over the past 2 weeks, how often have you been bothered by any of the following problems? Little interest or pleasure in doing things: 0 Feeling down, depressed, or hopeless (PHQ Adolescent also includes...irritable): 0 PHQ-2 Total Score: 0     Goals Addressed               This Visit's Progress     Remain active (pt-stated)               Objective:    Today's Vitals   04/25/24 1425  Weight: 149 lb (67.6 kg)  Height: 5' 2 (1.575 m)   Body mass index is 27.25 kg/m.  Hearing/Vision screen Hearing Screening - Comments:: Denies hearing difficulties   Vision Screening -  Comments:: Wears rx glasses - up to date with routine eye exams with  [facility] Immunizations and Health Maintenance Health Maintenance  Topic Date Due   COVID-19 Vaccine (7 - 2025-26 season) 01/16/2024   Mammogram  10/11/2024   Diabetic kidney evaluation - eGFR measurement  02/14/2025   Diabetic kidney evaluation - Urine ACR  02/19/2025   Medicare Annual Wellness (AWV)  04/25/2025   DTaP/Tdap/Td (3 - Td or Tdap) 03/30/2027   Colonoscopy  06/16/2027   Pneumococcal Vaccine: 50+ Years  Completed   Influenza Vaccine  Completed   Bone Density Scan  Completed   Zoster Vaccines- Shingrix  Completed   Meningococcal B Vaccine  Aged Out        Assessment/Plan:  This is a routine wellness examination for Kimberly Schroeder.  Patient Care Team: Panosh,  Apolinar POUR, MD as PCP - General Loni Soyla LABOR, MD as PCP - Cardiology (Cardiology) Frutoso Luz, MD (Allergy) Mat Browning, MD (Obstetrics and Gynecology) Nandigam, Kavitha V, MD as Consulting Physician (Gastroenterology) Jordan, Amy, MD as Consulting Physician (Dermatology) Loni Soyla LABOR, MD as Consulting Physician (Cardiology)  I have personally reviewed and noted the following in the patients chart:   Medical and social history Use of alcohol, tobacco or illicit drugs  Current medications and supplements including opioid prescriptions. Functional ability and status Nutritional status Physical activity Advanced directives List of other physicians Hospitalizations, surgeries, and ER visits in previous 12 months Vitals Screenings to include cognitive, depression, and falls Referrals and appointments  No orders of the defined types were placed in this encounter.  In addition, I have reviewed and discussed with patient certain preventive protocols, quality metrics, and best practice recommendations. A written personalized care plan for preventive services as well as general preventive health recommendations were provided to patient.   Rojelio LELON Blush, LPN   [id number]   Return in 53 weeks (on 05/01/2025).  After Visit Summary: (MyChart) Due to this being a telephonic visit, the after visit summary with patients personalized plan was offered to patient via MyChart   Nurse Notes: No voiced or noted concerns at this time

## 2024-04-26 DIAGNOSIS — J3089 Other allergic rhinitis: Secondary | ICD-10-CM | POA: Diagnosis not present

## 2024-04-26 DIAGNOSIS — J3081 Allergic rhinitis due to animal (cat) (dog) hair and dander: Secondary | ICD-10-CM | POA: Diagnosis not present

## 2024-04-26 DIAGNOSIS — J301 Allergic rhinitis due to pollen: Secondary | ICD-10-CM | POA: Diagnosis not present

## 2024-05-01 DIAGNOSIS — J3081 Allergic rhinitis due to animal (cat) (dog) hair and dander: Secondary | ICD-10-CM | POA: Diagnosis not present

## 2024-05-01 DIAGNOSIS — J301 Allergic rhinitis due to pollen: Secondary | ICD-10-CM | POA: Diagnosis not present

## 2024-05-01 DIAGNOSIS — J3089 Other allergic rhinitis: Secondary | ICD-10-CM | POA: Diagnosis not present

## 2024-08-27 ENCOUNTER — Ambulatory Visit: Admitting: Internal Medicine

## 2025-05-01 ENCOUNTER — Ambulatory Visit
